# Patient Record
Sex: Male | Born: 1950
Health system: Southern US, Community
[De-identification: ages and names within clinical notes are randomized; demographics above are authoritative.]

## PROBLEM LIST (undated history)

## (undated) DIAGNOSIS — M1990 Unspecified osteoarthritis, unspecified site: Secondary | ICD-10-CM

## (undated) DIAGNOSIS — I10 Essential (primary) hypertension: Secondary | ICD-10-CM

## (undated) DIAGNOSIS — R011 Cardiac murmur, unspecified: Secondary | ICD-10-CM

## (undated) DIAGNOSIS — E785 Hyperlipidemia, unspecified: Secondary | ICD-10-CM

## (undated) DIAGNOSIS — J8410 Pulmonary fibrosis, unspecified: Secondary | ICD-10-CM

## (undated) HISTORY — PX: HIP ARTHROPLASTY: SHX981

## (undated) HISTORY — DX: Essential (primary) hypertension: I10

## (undated) HISTORY — PX: HERNIA REPAIR: SHX51

## (undated) HISTORY — PX: JOINT REPLACEMENT: SHX530

## (undated) HISTORY — PX: CATARACT EXTRACTION, BILATERAL: SHX1313

## (undated) HISTORY — DX: Unspecified osteoarthritis, unspecified site: M19.90

## (undated) HISTORY — DX: Cardiac murmur, unspecified: R01.1

## (undated) HISTORY — DX: Hyperlipidemia, unspecified: E78.5

## (undated) HISTORY — DX: Pulmonary fibrosis, unspecified: J84.10

---

## 2003-04-13 LAB — HM COLONOSCOPY

## 2010-12-18 LAB — BASIC METABOLIC PANEL
BUN: 17 mg/dL (ref 4–21)
Creatinine: 0.8 mg/dL (ref 0.6–1.3)
Glucose: 89 mg/dL
Potassium: 4.6 mmol/L (ref 3.4–5.3)
Sodium: 141 mmol/L (ref 137–147)

## 2010-12-18 LAB — LIPID PANEL
Cholesterol: 171 mg/dL (ref 0–200)
HDL: 65 mg/dL (ref 35–70)
LDL Cholesterol: 99 mg/dL
Triglycerides: 34 mg/dL — AB (ref 40–160)

## 2011-01-07 DIAGNOSIS — E785 Hyperlipidemia, unspecified: Secondary | ICD-10-CM | POA: Insufficient documentation

## 2011-01-07 DIAGNOSIS — I10 Essential (primary) hypertension: Secondary | ICD-10-CM

## 2012-10-18 ENCOUNTER — Other Ambulatory Visit: Payer: Self-pay | Admitting: Family Medicine

## 2012-10-18 DIAGNOSIS — M79604 Pain in right leg: Secondary | ICD-10-CM

## 2012-10-21 ENCOUNTER — Ambulatory Visit (HOSPITAL_COMMUNITY)
Admission: RE | Admit: 2012-10-21 | Discharge: 2012-10-21 | Disposition: A | Discharge status: Home / Self Care | Payer: BC Managed Care – PPO | Source: Ambulatory Visit | Attending: Family Medicine | Admitting: Family Medicine

## 2012-10-21 DIAGNOSIS — M79609 Pain in unspecified limb: Secondary | ICD-10-CM | POA: Insufficient documentation

## 2012-10-21 DIAGNOSIS — M79604 Pain in right leg: Secondary | ICD-10-CM

## 2013-01-16 ENCOUNTER — Other Ambulatory Visit: Payer: Self-pay | Admitting: Family Medicine

## 2013-01-18 ENCOUNTER — Encounter: Payer: Self-pay | Admitting: Family Medicine

## 2013-01-18 ENCOUNTER — Ambulatory Visit (INDEPENDENT_AMBULATORY_CARE_PROVIDER_SITE_OTHER): Payer: BC Managed Care – PPO | Admitting: Family Medicine

## 2013-01-18 VITALS — BP 145/82 | HR 78 | Temp 97.0°F | Ht 68.5 in | Wt 179.8 lb

## 2013-01-18 DIAGNOSIS — E785 Hyperlipidemia, unspecified: Secondary | ICD-10-CM

## 2013-01-18 DIAGNOSIS — I10 Essential (primary) hypertension: Secondary | ICD-10-CM

## 2013-01-18 DIAGNOSIS — M1710 Unilateral primary osteoarthritis, unspecified knee: Secondary | ICD-10-CM

## 2013-01-18 DIAGNOSIS — M1711 Unilateral primary osteoarthritis, right knee: Secondary | ICD-10-CM | POA: Insufficient documentation

## 2013-01-18 DIAGNOSIS — IMO0002 Reserved for concepts with insufficient information to code with codable children: Secondary | ICD-10-CM

## 2013-01-18 LAB — LIPID PANEL
Cholesterol: 168 mg/dL (ref 0–200)
HDL: 75 mg/dL (ref >39)
LDL Cholesterol: 80 mg/dL (ref 0–99)
Total CHOL/HDL Ratio: 2.2 Ratio
Triglycerides: 67 mg/dL (ref <150)
VLDL: 13 mg/dL (ref 0–40)

## 2013-01-18 LAB — HEPATIC FUNCTION PANEL
ALT: 20 U/L (ref 0–53)
AST: 16 U/L (ref 0–37)
Albumin: 4.1 g/dL (ref 3.5–5.2)
Alkaline Phosphatase: 51 U/L (ref 39–117)
Bilirubin, Direct: 0.1 mg/dL (ref 0.0–0.3)
Indirect Bilirubin: 0.6 mg/dL (ref 0.0–0.9)
Total Bilirubin: 0.7 mg/dL (ref 0.3–1.2)
Total Protein: 6.3 g/dL (ref 6.0–8.3)

## 2013-01-18 MED ORDER — CHLORTHALIDONE 15 MG PO TABS: 15.0000 mg | ORAL_TABLET | Freq: Every day | ORAL | Status: DC

## 2013-01-18 NOTE — Assessment & Plan Note (Addendum)
No headache, chest pain. Taking the ramipril, amlodipine, for his blood pressure. Will add a diuretic to see if this will bring his blood pressure at goal. Discuss this change for the patient he agrees to it.

## 2013-01-18 NOTE — Assessment & Plan Note (Signed)
Would check a lipid panel today. Livalo has worked well for him so far at 4 mg daily. Will request approval again prescriptions of Livalo

## 2013-01-18 NOTE — Progress Notes (Signed)
Subjective:     Patient ID: Vernon Richard, male   DOB: 06/30/1951, 62 y.o.   MRN: [id number]  HPI Patient comes in for followup of his medical problems which includes 1 hypertension no headache chest pain palpitations nor pedal edema. No cough of the side effect of his medication. Ramipril. Hyperlipidemia trying to eat healthy no problem with that. Osteoarthritis of the right knee he did see orthopedics he did have ultrasound. He rather have conservative measures. He is taking Tylenol on a when necessary basis. One every couple days or so. He is not interested in arthroscopy on this as needed sports.    PMH/PSH: reviewed/updated in Epic  SH/FH: reviewed/updated in Epic  Allergies: reviewed/updated in Epic  Medications: reviewed/updated in Epic  Immunizations: reviewed/updated in Epic    Review of Systems  All other systems reviewed and are negative.       Objective:   Physical Exam  APPEARANCE:  Patient in no acute distress.The patient appeared well nourished and normally developed. Acyanotic.  VITAL SIGNS:BP 145/82  Pulse 78  Temp(Src) 97 F (36.1 C) (Oral)  Ht 5' 8.5" (1.74 m)  Wt 179 lb 12.8 oz (81.557 kg)  BMI 26.94 kg/m2   SKIN: warm and  Dry without overt rashes, tattoos and scars  HEAD and Neck: without JVD, Normal No scleral icterus  CHEST & LUNGS: Clear  CVS: Reveals the PMI to be normally located. Regular rhythm, First and Second Heart sounds are normal, and absence of murmurs, rubs or gallops.  ABDOMEN:  Benign,, no organomegaly, no masses, no Abdominal Aortic enlargement. No Guarding , no rebound. No Bruits.  RECTAL: Deferred not indicated to the  GU: Not applicable today .  Extremities: Both Femoral and Pedal pulses are normal.  MUSCULOSKELETAL:  Spine: normal Joints: intact except for right knee mild crepitus he does a full range of motion the minimal discomfort pain when stepping down off the exam table. Ligaments intact no joint effusion  today  NEUROLOGIC: oriented to time,place and person; nonfocal. Strength is normal Sensory is normal Reflexes are normal Cranial Nerves are normal.      Assessment:     Hypertension No headache, chest pain. Taking the ramipril, amlodipine, for his blood pressure. Will add a diuretic to see if this will bring his blood pressure at goal. Discuss this change for the patient he agrees to it.  Osteoarthritis of right knee Patient currently on he occasionally takes a Tylenol for his osteoarthritis of his right knee. He does not want any further intervention. He had seen orthopedics. Orthopedics recommended arthroscopic if it gets worse. At this point with some tolerable symptoms he can hold off on any other interventions.  Hyperlipidemia Would check a lipid panel today. Livalo has worked well for him so far at 4 mg daily. Will request approval again prescriptions of Livalo        Plan:     Orders Placed This Encounter  Procedures  . Lipid panel  . BASIC METABOLIC PANEL WITH GFR  . Hepatic function panel                                  Meds ordered this encounter  Medications  . Pitavastatin Calcium (LIVALO) 4 MG TABS    Sig: Take 1 tablet by mouth daily.  Marland Kitchen aspirin 81 MG tablet    Sig: Take 81 mg by mouth daily.  . chlorthalidone (HYGROTEN) 15 MG  tablet    Sig: Take 1 tablet (15 mg total) by mouth daily.    Dispense:  30 tablet    Refill:  3   return to clinic in 6 weeks to check his blood pressure and a BMP.    Kory Rains P. [place] Charon, M.D.

## 2013-01-18 NOTE — Assessment & Plan Note (Signed)
Patient currently on he occasionally takes a Tylenol for his osteoarthritis of his right knee. He does not want any further intervention. He had seen orthopedics. Orthopedics recommended arthroscopic if it gets worse. At this point with some tolerable symptoms he can hold off on any other interventions.

## 2013-01-18 NOTE — Patient Instructions (Signed)
Hypertension As your heart beats, it forces blood through your arteries. This force is your blood pressure. If the pressure is too high, it is called hypertension (HTN) or high blood pressure. HTN is dangerous because you may have it and not know it. High blood pressure may mean that your heart has to work harder to pump blood. Your arteries may be narrow or stiff. The extra work puts you at risk for heart disease, stroke, and other problems.  Blood pressure consists of two numbers, a higher number over a lower, 110/72, for example. It is stated as "110 over 72." The ideal is below 120 for the top number (systolic) and under 80 for the bottom (diastolic). Write down your blood pressure today. You should pay close attention to your blood pressure if you have certain conditions such as:  Heart failure.  Prior heart attack.  Diabetes  Chronic kidney disease.  Prior stroke.  Multiple risk factors for heart disease. To see if you have HTN, your blood pressure should be measured while you are seated with your arm held at the level of the heart. It should be measured at least twice. A one-time elevated blood pressure reading (especially in the Emergency Department) does not mean that you need treatment. There may be conditions in which the blood pressure is different between your right and left arms. It is important to see your caregiver soon for a recheck. Most people have essential hypertension which means that there is not a specific cause. This type of high blood pressure may be lowered by changing lifestyle factors such as:  Stress.  Smoking.  Lack of exercise.  Excessive weight.  Drug/tobacco/alcohol use.  Eating less salt. Most people do not have symptoms from high blood pressure until it has caused damage to the body. Effective treatment can often prevent, delay or reduce that damage. TREATMENT  When a cause has been identified, treatment for high blood pressure is directed at the  cause. There are a large number of medications to treat HTN. These fall into several categories, and your caregiver will help you select the medicines that are best for you. Medications may have side effects. You should review side effects with your caregiver. If your blood pressure stays high after you have made lifestyle changes or started on medicines,   Your medication(s) may need to be changed.  Other problems may need to be addressed.  Be certain you understand your prescriptions, and know how and when to take your medicine.  Be sure to follow up with your caregiver within the time frame advised (usually within two weeks) to have your blood pressure rechecked and to review your medications.  If you are taking more than one medicine to lower your blood pressure, make sure you know how and at what times they should be taken. Taking two medicines at the same time can result in blood pressure that is too low. SEEK IMMEDIATE MEDICAL CARE IF:  You develop a severe headache, blurred or changing vision, or confusion.  You have unusual weakness or numbness, or a faint feeling.  You have severe chest or abdominal pain, vomiting, or breathing problems. MAKE SURE YOU:   Understand these instructions.  Will watch your condition.  Will get help right away if you are not doing well or get worse. Document Released: 10/06/2005 Document Revised: 12/29/2011 Document Reviewed: 05/26/2008 [employer] Patient Information 2013 [employer]  Hypertriglyceridemia  Diet for High blood levels of Triglycerides Most fats in food are triglycerides.   Triglycerides in your blood are stored as fat in your body. High levels of triglycerides in your blood may put you at a greater risk for heart disease and stroke.  Normal triglyceride levels are less than 150 mg/dL. Borderline high levels are 150-199 mg/dl. High levels are 200 - 499 mg/dL, and very high triglyceride levels are greater than 500 mg/dL. The decision  to treat high triglycerides is generally based on the level. For people with borderline or high triglyceride levels, treatment includes weight loss and exercise. Drugs are recommended for people with very high triglyceride levels. Many people who need treatment for high triglyceride levels have metabolic syndrome. This syndrome is a collection of disorders that often include: insulin resistance, high blood pressure, blood clotting problems, high cholesterol and triglycerides. TESTING PROCEDURE FOR TRIGLYCERIDES  You should not eat 4 hours before getting your triglycerides measured. The normal range of triglycerides is between 10 and 250 milligrams per deciliter (mg/dl). Some people may have extreme levels (1000 or above), but your triglyceride level may be too high if it is above 150 mg/dl, depending on what other risk factors you have for heart disease.  People with high blood triglycerides may also have high blood cholesterol levels. If you have high blood cholesterol as well as high blood triglycerides, your risk for heart disease is probably greater than if you only had high triglycerides. High blood cholesterol is one of the main risk factors for heart disease. CHANGING YOUR DIET  Your weight can affect your blood triglyceride level. If you are more than 20% above your ideal body weight, you may be able to lower your blood triglycerides by losing weight. Eating less and exercising regularly is the best way to combat this. Fat provides more calories than any other food. The best way to lose weight is to eat less fat. Only 30% of your total calories should come from fat. Less than 7% of your diet should come from saturated fat. A diet low in fat and saturated fat is the same as a diet to decrease blood cholesterol. By eating a diet lower in fat, you may lose weight, lower your blood cholesterol, and lower your blood triglyceride level.  Eating a diet low in fat, especially saturated fat, may also help  you lower your blood triglyceride level. Ask your dietitian to help you figure how much fat you can eat based on the number of calories your caregiver has prescribed for you.  Exercise, in addition to helping with weight loss may also help lower triglyceride levels.   Alcohol can increase blood triglycerides. You may need to stop drinking alcoholic beverages.  Too much carbohydrate in your diet may also increase your blood triglycerides. Some complex carbohydrates are necessary in your diet. These may include bread, rice, potatoes, other starchy vegetables and cereals.  Reduce "simple" carbohydrates. These may include pure sugars, candy, honey, and jelly without losing other nutrients. If you have the kind of high blood triglycerides that is affected by the amount of carbohydrates in your diet, you will need to eat less sugar and less high-sugar foods. Your caregiver can help you with this.  Adding 2-4 grams of fish oil (EPA+ DHA) may also help lower triglycerides. Speak with your caregiver before adding any supplements to your regimen. Following the Diet  Maintain your ideal weight. Your caregivers can help you with a diet. Generally, eating less food and getting more exercise will help you lose weight. Joining a weight control group may also help.   Ask your caregivers for a good weight control group in your area.  Eat low-fat foods instead of high-fat foods. This can help you lose weight too.  These foods are lower in fat. Eat MORE of these:   Dried beans, peas, and lentils.  Egg whites.  Low-fat cottage cheese.  Fish.  Lean cuts of meat, such as round, sirloin, rump, and flank (cut extra fat off meat you fix).  Whole grain breads, cereals and pasta.  Skim and nonfat dry milk.  Low-fat yogurt.  Poultry without the skin.  Cheese made with skim or part-skim milk, such as mozzarella, parmesan, farmers', ricotta, or pot cheese. These are higher fat foods. Eat LESS of these:   Whole  milk and foods made from whole milk, such as American, blue, cheddar, monterey jack, and swiss cheese  High-fat meats, such as luncheon meats, sausages, knockwurst, bratwurst, hot dogs, ribs, corned beef, ground pork, and regular ground beef.  Fried foods. Limit saturated fats in your diet. Substituting unsaturated fat for saturated fat may decrease your blood triglyceride level. You will need to read package labels to know which products contain saturated fats.  These foods are high in saturated fat. Eat LESS of these:   Fried pork skins.  Whole milk.  Skin and fat from poultry.  Palm oil.  Butter.  Shortening.  Cream cheese.  Bacon.  Margarines and baked goods made from listed oils.  Vegetable shortenings.  Chitterlings.  Fat from meats.  Coconut oil.  Palm kernel oil.  Lard.  Cream.  Sour cream.  Fatback.  Coffee whiteners and non-dairy creamers made with these oils.  Cheese made from whole milk. Use unsaturated fats (both polyunsaturated and monounsaturated) moderately. Remember, even though unsaturated fats are better than saturated fats; you still want a diet low in total fat.  These foods are high in unsaturated fat:   Canola oil.  Sunflower oil.  Mayonnaise.  Almonds.  Peanuts.  Pine nuts.  Margarines made with these oils.  Safflower oil.  Olive oil.  Avocados.  Cashews.  Peanut butter.  Sunflower seeds.  Soybean oil.  Peanut oil.  Olives.  Pecans.  Walnuts.  Pumpkin seeds. Avoid sugar and other high-sugar foods. This will decrease carbohydrates without decreasing other nutrients. Sugar in your food goes rapidly to your blood. When there is excess sugar in your blood, your liver may use it to make more triglycerides. Sugar also contains calories without other important nutrients.  Eat LESS of these:   Sugar, brown sugar, powdered sugar, jam, jelly, preserves, honey, syrup, molasses, pies, candy, cakes, cookies,  frosting, pastries, colas, soft drinks, punches, fruit drinks, and regular gelatin.  Avoid alcohol. Alcohol, even more than sugar, may increase blood triglycerides. In addition, alcohol is high in calories and low in nutrients. Ask for sparkling water, or a diet soft drink instead of an alcoholic beverage. Suggestions for planning and preparing meals   Bake, broil, grill or roast meats instead of frying.  Remove fat from meats and skin from poultry before cooking.  Add spices, herbs, lemon juice or vinegar to vegetables instead of salt, rich sauces or gravies.  Use a non-stick skillet without fat or use no-stick sprays.  Cool and refrigerate stews and broth. Then remove the hardened fat floating on the surface before serving.  Refrigerate meat drippings and skim off fat to make low-fat gravies.  Serve more fish.  Use less butter, margarine and other high-fat spreads on bread or vegetables.  Use skim or reconstituted   non-fat dry milk for cooking.  Cook with low-fat cheeses.  Substitute low-fat yogurt or cottage cheese for all or part of the sour cream in recipes for sauces, dips or congealed salads.  Use half yogurt/half mayonnaise in salad recipes.  Substitute evaporated skim milk for cream. Evaporated skim milk or reconstituted non-fat dry milk can be whipped and substituted for whipped cream in certain recipes.  Choose fresh fruits for dessert instead of high-fat foods such as pies or cakes. Fruits are naturally low in fat. When Dining Out   Order low-fat appetizers such as fruit or vegetable juice, pasta with vegetables or tomato sauce.  Select clear, rather than cream soups.  Ask that dressings and gravies be served on the side. Then use less of them.  Order foods that are baked, broiled, poached, steamed, stir-fried, or roasted.  Ask for margarine instead of butter, and use only a small amount.  Drink sparkling water, unsweetened tea or coffee, or diet soft drinks  instead of alcohol or other sweet beverages. QUESTIONS AND ANSWERS ABOUT OTHER FATS IN THE BLOOD: SATURATED FAT, TRANS FAT, AND CHOLESTEROL What is trans fat? Trans fat is a type of fat that is formed when vegetable oil is hardened through a process called hydrogenation. This process helps makes foods more solid, gives them shape, and prolongs their shelf life. Trans fats are also called hydrogenated or partially hydrogenated oils.  What do saturated fat, trans fat, and cholesterol in foods have to do with heart disease? Saturated fat, trans fat, and cholesterol in the diet all raise the level of LDL "bad" cholesterol in the blood. The higher the LDL cholesterol, the greater the risk for coronary heart disease (CHD). Saturated fat and trans fat raise LDL similarly.  What foods contain saturated fat, trans fat, and cholesterol? High amounts of saturated fat are found in animal products, such as fatty cuts of meat, chicken skin, and full-fat dairy products like butter, whole milk, cream, and cheese, and in tropical vegetable oils such as palm, palm kernel, and coconut oil. Trans fat is found in some of the same foods as saturated fat, such as vegetable shortening, some margarines (especially hard or stick margarine), crackers, cookies, baked goods, fried foods, salad dressings, and other processed foods made with partially hydrogenated vegetable oils. Small amounts of trans fat also occur naturally in some animal products, such as milk products, beef, and lamb. Foods high in cholesterol include liver, other organ meats, egg yolks, shrimp, and full-fat dairy products. How can I use the new food label to make heart-healthy food choices? Check the Nutrition Facts panel of the food label. Choose foods lower in saturated fat, trans fat, and cholesterol. For saturated fat and cholesterol, you can also use the Percent Daily Value (%DV): 5% DV or less is low, and 20% DV or more is high. (There is no %DV for trans  fat.) Use the Nutrition Facts panel to choose foods low in saturated fat and cholesterol, and if the trans fat is not listed, read the ingredients and limit products that list shortening or hydrogenated or partially hydrogenated vegetable oil, which tend to be high in trans fat. POINTS TO REMEMBER:   Discuss your risk for heart disease with your caregivers, and take steps to reduce risk factors.  Change your diet. Choose foods that are low in saturated fat, trans fat, and cholesterol.  Add exercise to your daily routine if it is not already being done. Participate in physical activity of moderate intensity, like   brisk walking, for at least 30 minutes on most, and preferably all days of the week. No time? Break the 30 minutes into three, 10-minute segments during the day.  Stop smoking. If you do smoke, contact your caregiver to discuss ways in which they can help you quit.  Do not use street drugs.  Maintain a normal weight.  Maintain a healthy blood pressure.  Keep up with your blood work for checking the fats in your blood as directed by your caregiver. Document Released: 07/24/2004 Document Revised: 04/06/2012 Document Reviewed: 02/19/2009 [employer] Patient Information 2013 [employer].  

## 2013-01-19 LAB — BASIC METABOLIC PANEL WITH GFR
BUN: 17 mg/dL (ref 6–23)
CO2: 24 mEq/L (ref 19–32)
Calcium: 9.1 mg/dL (ref 8.4–10.5)
Chloride: 106 mEq/L (ref 96–112)
Creat: 0.91 mg/dL (ref 0.50–1.35)
GFR, Est African American: >89 mL/min
GFR, Est Non African American: >89 mL/min
Glucose, Bld: 86 mg/dL (ref 70–99)
Potassium: 4.4 mEq/L (ref 3.5–5.3)
Sodium: 140 mEq/L (ref 135–145)

## 2013-01-19 NOTE — Progress Notes (Signed)
Quick Note:  Call patient. Labs normal. No change in plan. ______ 

## 2013-01-21 ENCOUNTER — Other Ambulatory Visit: Payer: Self-pay

## 2013-01-21 MED ORDER — CHLORTHALIDONE 25 MG PO TABS: 25.0000 mg | ORAL_TABLET | Freq: Every day | ORAL | Status: DC

## 2013-03-01 ENCOUNTER — Encounter: Payer: Self-pay | Admitting: Family Medicine

## 2013-03-01 ENCOUNTER — Ambulatory Visit (INDEPENDENT_AMBULATORY_CARE_PROVIDER_SITE_OTHER): Payer: BC Managed Care – PPO | Admitting: Family Medicine

## 2013-03-01 VITALS — BP 134/77 | HR 85 | Temp 98.9°F | Wt 180.0 lb

## 2013-03-01 DIAGNOSIS — I10 Essential (primary) hypertension: Secondary | ICD-10-CM

## 2013-03-01 LAB — BASIC METABOLIC PANEL WITH GFR
BUN: 13 mg/dL (ref 6–23)
CO2: 28 mEq/L (ref 19–32)
Calcium: 9.5 mg/dL (ref 8.4–10.5)
Chloride: 101 mEq/L (ref 96–112)
Creat: 0.99 mg/dL (ref 0.50–1.35)
GFR, Est African American: >89 mL/min
GFR, Est Non African American: 81 mL/min
Glucose, Bld: 99 mg/dL (ref 70–99)
Potassium: 4.2 mEq/L (ref 3.5–5.3)
Sodium: 137 mEq/L (ref 135–145)

## 2013-03-01 NOTE — Progress Notes (Signed)
Patient ID: Vernon Richard, male   DOB: 06-11-1951, 62 y.o.   MRN: [id number] SUBJECTIVE: HPI: Patient is here for follow up of hyperlipidemia/hypertension/OA right knee: denies Headache;denies Chest Pain;denies weakness;denies Shortness of Breath and orthopnea;denies Visual changes;denies palpitations;denies cough;denies pedal edema;denies symptoms of TIA or stroke;deniesClaudication symptoms. admits to Compliance with medications; denies Problems with medications. Recently adjusted his BP meds. Here for bp check.    PMH/PSH: reviewed/updated in Epic  SH/FH: reviewed/updated in Epic  Allergies: reviewed/updated in Epic  Medications: reviewed/updated in Epic  Immunizations: reviewed/updated in Epic  ROS: As above in the HPI. All other systems are stable or negative.  OBJECTIVE: APPEARANCE:  Patient in no acute distress.The patient appeared well nourished and normally developed. Acyanotic. Waist: VITAL SIGNS:BP 134/77  Pulse 85  Temp(Src) 98.9 F (37.2 C) (Oral)  Wt 180 lb (81.647 kg)  BMI 26.97 kg/m2   SKIN: warm and  Dry without overt rashes, tattoos and scars  HEAD and Neck: without JVD, Head and scalp: normal Eyes:No scleral icterus. Fundi normal, eye movements normal. Ears: Auricle normal, canal normal, Tympanic membranes normal, insufflation normal. Nose: normal Throat: normal Neck & thyroid: normal  CHEST & LUNGS: Chest wall: normal Lungs: Clear  CVS: Reveals the PMI to be normally located. Regular rhythm, First and Second Heart sounds are normal,  absence of murmurs, rubs or gallops. Peripheral vasculature: Radial pulses: normal Dorsal pedis pulses: normal Posterior pulses: normal  ABDOMEN:  Appearance: normal Benign,, no organomegaly, no masses, no Abdominal Aortic enlargement. No Guarding , no rebound. No Bruits. Bowel sounds: normal  RECTAL: N/A GU: N/A  EXTREMETIES: nonedematous. Both Femoral and Pedal pulses are normal.  MUSCULOSKELETAL:   Spine: normal Joints: intact  NEUROLOGIC: oriented to time,place and person; nonfocal. Strength is normal Sensory is normal Reflexes are normal Cranial Nerves are normal.  LABS: Results for orders placed in visit on 01/18/13  LIPID PANEL      Result Value Range   Cholesterol 168  0 - 200 mg/dL   Triglycerides 67  <409 mg/dL   HDL 75  >81 mg/dL   Total CHOL/HDL Ratio 2.2     VLDL 13  0 - 40 mg/dL   LDL Cholesterol 80  0 - 99 mg/dL  BASIC METABOLIC PANEL WITH GFR      Result Value Range   Sodium 140  135 - 145 mEq/L   Potassium 4.4  3.5 - 5.3 mEq/L   Chloride 106  96 - 112 mEq/L   CO2 24  19 - 32 mEq/L   Glucose, Bld 86  70 - 99 mg/dL   BUN 17  6 - 23 mg/dL   Creat 1.91  4.78 - 2.95 mg/dL   Calcium 9.1  8.4 - 62.1 mg/dL   GFR, Est African American >89     GFR, Est Non African American >89    HEPATIC FUNCTION PANEL      Result Value Range   Total Bilirubin 0.7  0.3 - 1.2 mg/dL   Bilirubin, Direct 0.1  0.0 - 0.3 mg/dL   Indirect Bilirubin 0.6  0.0 - 0.9 mg/dL   Alkaline Phosphatase 51  39 - 117 U/L   AST 16  0 - 37 U/L   ALT 20  0 - 53 U/L   Total Protein 6.3  6.0 - 8.3 g/dL   Albumin 4.1  3.5 - 5.2 g/dL    ASSESSMENT: HTN (hypertension) - Plan: BASIC METABOLIC PANEL WITH GFR  BP better now.acceptable for age.  PLAN: Orders  Placed This Encounter  Procedures  . BASIC METABOLIC PANEL WITH GFR   No orders of the defined types were placed in this encounter.   RTc 4 months.  Emmett Arntz P. [place] Charon, M.D.

## 2013-03-02 NOTE — Progress Notes (Signed)
Quick Note:  Call patient. Labs normal. No change in plan. ______ 

## 2013-04-16 ENCOUNTER — Other Ambulatory Visit: Payer: Self-pay | Admitting: Family Medicine

## 2013-05-19 ENCOUNTER — Other Ambulatory Visit: Payer: Self-pay | Admitting: *Deleted

## 2013-05-19 MED ORDER — CHLORTHALIDONE 25 MG PO TABS: 25.0000 mg | ORAL_TABLET | Freq: Every day | ORAL | Status: DC

## 2013-05-27 ENCOUNTER — Encounter: Payer: Self-pay | Admitting: Family Medicine

## 2013-05-27 ENCOUNTER — Ambulatory Visit (INDEPENDENT_AMBULATORY_CARE_PROVIDER_SITE_OTHER): Payer: BC Managed Care – PPO

## 2013-05-27 ENCOUNTER — Ambulatory Visit (INDEPENDENT_AMBULATORY_CARE_PROVIDER_SITE_OTHER): Payer: BC Managed Care – PPO | Admitting: Family Medicine

## 2013-05-27 VITALS — BP 169/89 | HR 90 | Temp 97.8°F | Wt 185.0 lb

## 2013-05-27 DIAGNOSIS — S9002XA Contusion of left ankle, initial encounter: Secondary | ICD-10-CM

## 2013-05-27 DIAGNOSIS — M5416 Radiculopathy, lumbar region: Secondary | ICD-10-CM

## 2013-05-27 DIAGNOSIS — IMO0002 Reserved for concepts with insufficient information to code with codable children: Secondary | ICD-10-CM

## 2013-05-27 DIAGNOSIS — S9000XA Contusion of unspecified ankle, initial encounter: Secondary | ICD-10-CM

## 2013-05-27 MED ORDER — PREDNISONE 20 MG PO TABS: 20.0000 mg | ORAL_TABLET | Freq: Every day | ORAL | Status: DC

## 2013-05-27 NOTE — Progress Notes (Signed)
Patient ID: Vernon Richard, male   DOB: 04/03/1951, 62 y.o.   MRN: [id number] SUBJECTIVE: CC: Chief Complaint  Patient presents with  . 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 It is getting better.  He came today because since then the left thigh has been on fire and his left lower back has been sore. No weakness in the legs., no fever.  Past Medical History  Diagnosis Date  . Hyperlipidemia   . Hypertension    No past surgical history on file. History   Social History  . Marital Status: Married    Spouse Name: N/A    Number of Children: N/A  . Years of Education: N/A   Occupational History  . Not on file.   Social History Main Topics  . Smoking status: Never Smoker   . Smokeless tobacco: Not on file  . Alcohol Use: No  . Drug Use: No  . Sexually Active: Not on file   Other Topics Concern  . Not on file   Social History Narrative   Married    1 child   No family history on file. Current Outpatient Prescriptions on File Prior to Visit  Medication Sig Dispense Refill  . amLODipine (NORVASC) 10 MG tablet TAKE ONE TABLET BY MOUTH ONE TIME DAILY  30 tablet  4  . aspirin 81 MG tablet Take 81 mg by mouth daily.      . chlorthalidone (HYGROTON) 25 MG tablet Take 1 tablet (25 mg total) by mouth daily.  30 tablet  1  . Pitavastatin Calcium (LIVALO) 4 MG TABS Take 1 tablet by mouth daily.      . ramipril (ALTACE) 5 MG tablet Take 5 mg by mouth daily.         No current facility-administered medications on file prior to visit.   No Known Allergies  There is no immunization history on file for this patient. Prior to Admission medications   Medication Sig Start Date End Date Taking? Authorizing Provider  amLODipine (NORVASC) 10 MG tablet TAKE ONE TABLET BY MOUTH ONE TIME DAILY 04/16/13  Yes  Ernestina Penna, MD  aspirin 81 MG tablet Take 81 mg by mouth daily.   Yes Historical Provider, MD  chlorthalidone (HYGROTON) 25 MG tablet Take 1 tablet (25 mg total) by mouth daily. 05/19/13  Yes Ileana Ladd, MD  Pitavastatin Calcium (LIVALO) 4 MG TABS Take 1 tablet by mouth daily.   Yes Historical Provider, MD  ramipril (ALTACE) 5 MG tablet Take 5 mg by mouth daily.     Yes Historical Provider, MD  predniSONE (DELTASONE) 20 MG tablet Take 1 tablet (20 mg total) by mouth daily. 2 tablets daily for 3 days, then 1 tablet daily for 3 days, then 1/2 tablet daily for 4 days 05/27/13   Ileana Ladd, MD     ROS: As above in the HPI. All other systems are stable or negative.  OBJECTIVE: APPEARANCE:  Patient in no acute distress.The patient appeared well nourished and normally developed. Acyanotic. Waist: VITAL SIGNS:  SKIN: warm and  Dry without overt rashes, tattoos and scars  HEAD and Neck: without JVD, Head and scalp: normal Eyes:No scleral icterus. Fundi normal, eye movements normal. Ears: Auricle normal, canal normal,  Tympanic membranes normal, insufflation normal. Nose: normal Throat: normal Neck & thyroid: normal  CHEST & LUNGS: Chest wall: normal Lungs: Clear  CVS: Reveals the PMI to be normally located. Regular rhythm, First and Second Heart sounds are normal,  absence of murmurs, rubs or gallops. Peripheral vasculature: Radial pulses: normal Dorsal pedis pulses: normal Posterior pulses: normal  ABDOMEN:  Appearance: normal Benign, no organomegaly, no masses, no Abdominal Aortic enlargement. No Guarding , no rebound. No Bruits. Bowel sounds: normal  RECTAL: N/A GU: N/A  EXTREMETIES: nonedematous. Both Femoral and Pedal pulses are normal.  MUSCULOSKELETAL:  Spine: normal Joints: intact  NEUROLOGIC: oriented to time,place and person; nonfocal. Strength is normal Sensory is normal Reflexes are normal Cranial Nerves are normal.   Results for orders  placed in visit on 03/01/13  BASIC METABOLIC PANEL WITH GFR      Result Value Range   Sodium 137  135 - 145 mEq/L   Potassium 4.2  3.5 - 5.3 mEq/L   Chloride 101  96 - 112 mEq/L   CO2 28  19 - 32 mEq/L   Glucose, Bld 99  70 - 99 mg/dL   BUN 13  6 - 23 mg/dL   Creat 0.45  4.09 - 8.11 mg/dL   Calcium 9.5  8.4 - 91.4 mg/dL   GFR, Est African American >89     GFR, Est Non African American 81      ASSESSMENT: Contusion, ankle, left, initial encounter - Plan: DG Ankle Complete Left  Lumbar radiculopathy - Plan: DG Lumbar Spine 2-3 Views, predniSONE (DELTASONE) 20 MG tablet  PLAN:  [facility] reading (PRIMARY) by  Dr. Modesto Charon    : no Fracture seen in the ankle, the lumbar spine has degenerative changes.  Orders Placed This Encounter  Procedures  . DG Lumbar Spine 2-3 Views    Standing Status: Future     Number of Occurrences: 1     Standing Expiration Date: 07/27/2014    Order Specific Question:  Reason for Exam (SYMPTOM  OR DIAGNOSIS REQUIRED)    Answer:  back pain with radiculopathy    Order Specific Question:  Preferred imaging location?    Answer:  Internal  . DG Ankle Complete Left    Standing Status: Future     Number of Occurrences: 1     Standing Expiration Date: 07/27/2014    Order Specific Question:  Reason for Exam (SYMPTOM  OR DIAGNOSIS REQUIRED)    Answer:  kicked by a cow    Order Specific Question:  Preferred imaging location?    Answer:  Internal   Meds ordered this encounter  Medications  . predniSONE (DELTASONE) 20 MG tablet    Sig: Take 1 tablet (20 mg total) by mouth daily. 2 tablets daily for 3 days, then 1 tablet daily for 3 days, then 1/2 tablet daily for 4 days    Dispense:  11 tablet    Refill:  0   Return if symptoms worsen or fail to improve.  Smriti Barkow P. Modesto Charon, M.D.

## 2013-07-05 ENCOUNTER — Encounter: Payer: Self-pay | Admitting: Family Medicine

## 2013-07-05 ENCOUNTER — Ambulatory Visit (INDEPENDENT_AMBULATORY_CARE_PROVIDER_SITE_OTHER): Payer: 59 | Admitting: Family Medicine

## 2013-07-05 VITALS — BP 149/82 | HR 85 | Temp 97.8°F | Ht 69.0 in | Wt 183.2 lb

## 2013-07-05 DIAGNOSIS — M1710 Unilateral primary osteoarthritis, unspecified knee: Secondary | ICD-10-CM

## 2013-07-05 DIAGNOSIS — M1711 Unilateral primary osteoarthritis, right knee: Secondary | ICD-10-CM

## 2013-07-05 DIAGNOSIS — I10 Essential (primary) hypertension: Secondary | ICD-10-CM

## 2013-07-05 DIAGNOSIS — IMO0002 Reserved for concepts with insufficient information to code with codable children: Secondary | ICD-10-CM

## 2013-07-05 DIAGNOSIS — R011 Cardiac murmur, unspecified: Secondary | ICD-10-CM

## 2013-07-05 DIAGNOSIS — E785 Hyperlipidemia, unspecified: Secondary | ICD-10-CM

## 2013-07-05 NOTE — Progress Notes (Signed)
Patient ID: Vernon Richard, male   DOB: 1950-12-14, 62 y.o.   MRN: [id number] SUBJECTIVE: CC: Chief Complaint  Patient presents with  . Follow-up    4 month     HPI: Patient is here for follow up of hyperlipidemia/htn/osteoarthritis: denies Headache;denies Chest Pain;denies weakness;denies Shortness of Breath and orthopnea;denies Visual changes;denies palpitations;denies cough;denies pedal edema;denies symptoms of TIA or stroke;deniesClaudication symptoms. admits to Compliance with medications; denies Problems with medications.  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  Past Medical History  Diagnosis Date  . Hyperlipidemia   . Hypertension    No past surgical history on file. History   Social History  . Marital Status: Married    Spouse Name: N/A    Number of Children: N/A  . Years of Education: N/A   Occupational History  . Not on file.   Social History Main Topics  . Smoking status: Never Smoker   . Smokeless tobacco: Not on file  . Alcohol Use: No  . Drug Use: No  . Sexual Activity: Not on file   Other Topics Concern  . Not on file   Social History Narrative   Married    1 child   No family history on file. Current Outpatient Prescriptions on File Prior to Visit  Medication Sig Dispense Refill  . amLODipine (NORVASC) 10 MG tablet TAKE ONE TABLET BY MOUTH ONE TIME DAILY  30 tablet  4  . aspirin 81 MG tablet Take 81 mg by mouth daily.      . chlorthalidone (HYGROTON) 25 MG tablet Take 1 tablet (25 mg total) by mouth daily.  30 tablet  1  . Pitavastatin Calcium (LIVALO) 4 MG TABS Take 1 tablet by mouth daily.      . ramipril (ALTACE) 5 MG tablet Take 5 mg by mouth daily.         No current facility-administered medications on file prior to visit.   No Known Allergies  There is no immunization history on file for this patient. Prior to Admission medications   Medication Sig Start Date End Date Taking? Authorizing Provider  amLODipine  (NORVASC) 10 MG tablet TAKE ONE TABLET BY MOUTH ONE TIME DAILY 04/16/13   Ernestina Penna, MD  aspirin 81 MG tablet Take 81 mg by mouth daily.    Historical Provider, MD  chlorthalidone (HYGROTON) 25 MG tablet Take 1 tablet (25 mg total) by mouth daily. 05/19/13   Ileana Ladd, MD  Pitavastatin Calcium (LIVALO) 4 MG TABS Take 1 tablet by mouth daily.    Historical Provider, MD  ramipril (ALTACE) 5 MG tablet Take 5 mg by mouth daily.      Historical Provider, MD     ROS: As above in the HPI. All other systems are stable or negative.  OBJECTIVE: APPEARANCE:  Patient in no acute distress.The patient appeared well nourished and normally developed. Acyanotic. Waist: VITAL SIGNS:BP 149/82  Pulse 85  Temp(Src) 97.8 F (36.6 C) (Oral)  Ht 5\' 9"  (1.753 m)  Wt 183 lb 3.2 oz (83.099 kg)  BMI 27.04 kg/m2 WM BP 125/75  SKIN: warm and  Dry without overt rashes, tattoos and scars  HEAD and Neck: without JVD, Head and scalp: normal Eyes:No scleral icterus. Fundi normal, eye movements normal. Ears: Auricle normal, canal normal, Tympanic membranes normal, insufflation normal. Nose: normal Throat: normal Neck & thyroid: normal  CHEST & LUNGS: Chest wall: normal Lungs: Clear  CVS: Reveals the PMI to be normally located.  Regular rhythm, First and Second Heart sounds are normal,  Soft 2/6 Ejection murmur at the LSB , no other murmurs, rubs or gallops. Peripheral vasculature: Radial pulses: normal Dorsal pedis pulses: normal Posterior pulses: normal  ABDOMEN:  Appearance: normal Benign, no organomegaly, no masses, no Abdominal Aortic enlargement. No Guarding , no rebound. No Bruits. Bowel sounds: normal  RECTAL: N/A GU: N/A  EXTREMETIES: nonedematous.  MUSCULOSKELETAL:  Spine: normal Joints: right knee arthritis stable  NEUROLOGIC: oriented to time,place and person; nonfocal. Strength is normal Sensory is normal Reflexes are normal Cranial Nerves are normal.  Results for  orders placed in visit on 03/01/13  BASIC METABOLIC PANEL WITH GFR      Result Value Range   Sodium 137  135 - 145 mEq/L   Potassium 4.2  3.5 - 5.3 mEq/L   Chloride 101  96 - 112 mEq/L   CO2 28  19 - 32 mEq/L   Glucose, Bld 99  70 - 99 mg/dL   BUN 13  6 - 23 mg/dL   Creat 4.01  0.27 - 2.53 mg/dL   Calcium 9.5  8.4 - 66.4 mg/dL   GFR, Est African American >89     GFR, Est Non African American 81      ASSESSMENT: Osteoarthritis of right knee  Hypertension - Plan: CMP14+EGFR  Hyperlipidemia - Plan: CMP14+EGFR, NMR, lipoprofile  Heart murmur, systolic - Plan: 2D Echocardiogram without contrast  PLAN: Orders Placed This Encounter  Procedures  . CMP14+EGFR  . NMR, lipoprofile  . 2D Echocardiogram without contrast    Standing Status: Future     Number of Occurrences:      Standing Expiration Date: 07/05/2014    Order Specific Question:  Type of Echo    Answer:  Complete    Order Specific Question:  Reason for Exam    Answer:  newly heard ejection murmur at the left sternal border.    Order Specific Question:  Where should this test be performed    Answer:  [facility]    Discussed with patient the need to look at the heart valves in view of the new murmur. He agrees.  Diet and exercise  Continue the same medications. Keep active.  Return in about 4 months (around 11/04/2013) for Recheck medical problems.  Jakelin Taussig P. Modesto Charon, M.D.

## 2013-07-07 LAB — CMP14+EGFR
ALT: 19 IU/L (ref 0–44)
AST: 15 IU/L (ref 0–40)
Albumin/Globulin Ratio: 2.4 (ref 1.1–2.5)
Albumin: 4.6 g/dL (ref 3.6–4.8)
Alkaline Phosphatase: 52 IU/L (ref 39–117)
BUN/Creatinine Ratio: 11 (ref 10–22)
BUN: 10 mg/dL (ref 8–27)
CO2: 25 mmol/L (ref 18–29)
Calcium: 9.4 mg/dL (ref 8.6–10.2)
Chloride: 94 mmol/L — ABNORMAL LOW (ref 97–108)
Creatinine, Ser: 0.9 mg/dL (ref 0.76–1.27)
GFR calc Af Amer: 105 mL/min/{1.73_m2} (ref >59)
GFR calc non Af Amer: 91 mL/min/{1.73_m2} (ref >59)
Globulin, Total: 1.9 g/dL (ref 1.5–4.5)
Glucose: 102 mg/dL — ABNORMAL HIGH (ref 65–99)
Potassium: 4.3 mmol/L (ref 3.5–5.2)
Sodium: 135 mmol/L (ref 134–144)
Total Bilirubin: 0.6 mg/dL (ref 0.0–1.2)
Total Protein: 6.5 g/dL (ref 6.0–8.5)

## 2013-07-07 LAB — NMR, LIPOPROFILE
Cholesterol: 191 mg/dL (ref <200)
HDL Cholesterol by NMR: 70 mg/dL (ref >=40)
HDL Particle Number: 53.8 umol/L (ref >=30.5)
LDL Particle Number: 1382 nmol/L — ABNORMAL HIGH (ref <1000)
LDL Size: 20.8 nm (ref >20.5)
LDLC SERPL CALC-MCNC: 81 mg/dL (ref <100)
LP-IR Score: 47 — ABNORMAL HIGH (ref <=45)
Small LDL Particle Number: 489 nmol/L (ref <=527)
Triglycerides by NMR: 198 mg/dL — ABNORMAL HIGH (ref <150)

## 2013-07-14 ENCOUNTER — Other Ambulatory Visit: Payer: Self-pay | Admitting: Family Medicine

## 2013-07-18 ENCOUNTER — Telehealth: Payer: Self-pay | Admitting: Family Medicine

## 2013-07-19 ENCOUNTER — Ambulatory Visit (INDEPENDENT_AMBULATORY_CARE_PROVIDER_SITE_OTHER): Payer: 59

## 2013-07-19 DIAGNOSIS — Z23 Encounter for immunization: Secondary | ICD-10-CM

## 2013-07-19 NOTE — Telephone Encounter (Signed)
Pt aware of labs  

## 2013-09-16 ENCOUNTER — Other Ambulatory Visit: Payer: Self-pay | Admitting: Family Medicine

## 2013-10-12 ENCOUNTER — Other Ambulatory Visit: Payer: Self-pay | Admitting: Family Medicine

## 2013-11-08 ENCOUNTER — Encounter: Payer: Self-pay | Admitting: Family Medicine

## 2013-11-08 ENCOUNTER — Ambulatory Visit (INDEPENDENT_AMBULATORY_CARE_PROVIDER_SITE_OTHER): Payer: BC Managed Care – PPO | Admitting: Family Medicine

## 2013-11-08 VITALS — BP 154/86 | HR 84 | Temp 97.1°F | Ht 69.0 in | Wt 183.8 lb

## 2013-11-08 DIAGNOSIS — M1990 Unspecified osteoarthritis, unspecified site: Secondary | ICD-10-CM

## 2013-11-08 DIAGNOSIS — I10 Essential (primary) hypertension: Secondary | ICD-10-CM

## 2013-11-08 DIAGNOSIS — E785 Hyperlipidemia, unspecified: Secondary | ICD-10-CM

## 2013-11-08 DIAGNOSIS — R011 Cardiac murmur, unspecified: Secondary | ICD-10-CM | POA: Insufficient documentation

## 2013-11-08 DIAGNOSIS — M1711 Unilateral primary osteoarthritis, right knee: Secondary | ICD-10-CM

## 2013-11-08 DIAGNOSIS — M1710 Unilateral primary osteoarthritis, unspecified knee: Secondary | ICD-10-CM

## 2013-11-08 DIAGNOSIS — IMO0002 Reserved for concepts with insufficient information to code with codable children: Secondary | ICD-10-CM

## 2013-11-08 DIAGNOSIS — M129 Arthropathy, unspecified: Secondary | ICD-10-CM

## 2013-11-08 MED ORDER — DICLOFENAC SODIUM 3 % TD GEL: TRANSDERMAL | Status: DC

## 2013-11-08 NOTE — Progress Notes (Signed)
Patient ID: Vernon Richard, male   DOB: 04/06/1951, 63 y.o.   MRN: [id number] SUBJECTIVE: CC: Chief Complaint  Patient presents with  . Follow-up    4 month follow up chronic problems c/o legs achy . needs refills     HPI: Patient is here for follow up of hyperlipidemia/HTN/OA of knees: denies Headache;denies Chest Pain;denies weakness;denies Shortness of Breath and orthopnea;denies Visual changes;denies palpitations;denies cough;denies pedal edema;denies symptoms of TIA or stroke;deniesClaudication symptoms. admits to Compliance with medications; denies Problems with medications. He has a heart murmur and never had his echocardiogram. He was never called.  Knees are the main problems.left worse than the right  BPs are fine at home at 120s/70s-80s Past Medical History  Diagnosis Date  . Hyperlipidemia   . Hypertension   . Heart murmur, systolic   . Arthritis     OA of both knees   No past surgical history on file. History   Social History  . Marital Status: Married    Spouse Name: N/A    Number of Children: N/A  . Years of Education: N/A   Occupational History  . Not on file.   Social History Main Topics  . Smoking status: Never Smoker   . Smokeless tobacco: Not on file  . Alcohol Use: No  . Drug Use: No  . Sexual Activity: Not on file   Other Topics Concern  . Not on file   Social History Narrative   Married    1 child   No family history on file. Current Outpatient Prescriptions on File Prior to Visit  Medication Sig Dispense Refill  . amLODipine (NORVASC) 10 MG tablet TAKE ONE TABLET BY MOUTH ONE TIME DAILY  30 tablet  3  . aspirin 81 MG tablet Take 81 mg by mouth daily.      . chlorthalidone (HYGROTON) 25 MG tablet TAKE ONE TABLET BY MOUTH ONE TIME DAILY  30 tablet  3  . LIVALO 4 MG TABS TAKE ONE TABLET BY MOUTH AT BEDTIME  30 tablet  2  . ramipril (ALTACE) 10 MG capsule TAKE ONE CAPSULE BY MOUTH TWICE DAILY  60 capsule  2   No current facility-administered  medications on file prior to visit.   No Known Allergies Immunization History  Administered Date(s) Administered  . Influenza,inj,Quad PF,36+ Mos 07/19/2013  . Tdap 07/26/2012   Prior to Admission medications   Medication Sig Start Date End Date Taking? Authorizing Provider  amLODipine (NORVASC) 10 MG tablet TAKE ONE TABLET BY MOUTH ONE TIME DAILY 09/16/13  Yes Chipper Herb, MD  aspirin 81 MG tablet Take 81 mg by mouth daily.   Yes Historical Provider, MD  chlorthalidone (HYGROTON) 25 MG tablet TAKE ONE TABLET BY MOUTH ONE TIME DAILY 07/14/13  Yes Vernie Shanks, MD  LIVALO 4 MG TABS TAKE ONE TABLET BY MOUTH AT BEDTIME 10/12/13  Yes Vernie Shanks, MD  ramipril (ALTACE) 10 MG capsule TAKE ONE CAPSULE BY MOUTH TWICE DAILY 10/12/13  Yes Vernie Shanks, MD     ROS: As above in the HPI. All other systems are stable or negative.  OBJECTIVE: APPEARANCE:  Patient in no acute distress.The patient appeared well nourished and normally developed. Acyanotic. Waist: VITAL SIGNS:BP 154/86  Pulse 84  Temp(Src) 97.1 F (36.2 C) (Oral)  Ht _0  (1.753 m)  Wt 183 lb 12.8 oz (83.371 kg)  BMI 27.13 kg/m2  Recheck BP 132/80LA and 128/80 RA  WM looks well SKIN: warm and  Dry without  overt rashes, tattoos and scars  HEAD and Neck: without JVD, Head and scalp: normal Eyes:No scleral icterus. Fundi normal, eye movements normal. Ears: Auricle normal, canal normal, Tympanic membranes normal, insufflation normal. Nose: normal Throat: normal Neck & thyroid: normal  CHEST & LUNGS: Chest wall: normal Lungs: Clear  CVS: Reveals the PMI to be normally located. Regular rhythm, First and Second Heart sounds are normal,  absence of murmurs, rubs or gallops. Peripheral vasculature: Radial pulses: normal Dorsal pedis pulses: normal Posterior pulses: normal  ABDOMEN:  Appearance: normal Benign, no organomegaly, no masses, no Abdominal Aortic enlargement. No Guarding , no rebound. No  Bruits. Bowel sounds: normal  RECTAL: N/A GU: N/A  EXTREMETIES: nonedematous.  MUSCULOSKELETAL:  Spine: normal Joints: left knee has crepitus. FROM. Mild  Discomfort. Right knee has mild  Discomfort and mild  crepitus  NEUROLOGIC: oriented to time,place and person; nonfocal.  Results for orders placed in visit on 07/05/13  CMP14+EGFR      Result Value Range   Glucose 102 (*) 65 - 99 mg/dL   BUN 10  8 - 27 mg/dL   Creatinine, Ser 0.90  0.76 - 1.27 mg/dL   GFR calc non Af Amer 91  >59 mL/min/1.73   GFR calc Af Amer 105  >59 mL/min/1.73   BUN/Creatinine Ratio 11  10 - 22   Sodium 135  134 - 144 mmol/L   Potassium 4.3  3.5 - 5.2 mmol/L   Chloride 94 (*) 97 - 108 mmol/L   CO2 25  18 - 29 mmol/L   Calcium 9.4  8.6 - 10.2 mg/dL   Total Protein 6.5  6.0 - 8.5 g/dL   Albumin 4.6  3.6 - 4.8 g/dL   Globulin, Total 1.9  1.5 - 4.5 g/dL   Albumin/Globulin Ratio 2.4  1.1 - 2.5   Total Bilirubin 0.6  0.0 - 1.2 mg/dL   Alkaline Phosphatase 52  39 - 117 IU/L   AST 15  0 - 40 IU/L   ALT 19  0 - 44 IU/L  NMR, LIPOPROFILE      Result Value Range   LDL Particle Number [id number] (*) <1000 nmol/L   LDLC SERPL CALC-MCNC 81  <100 mg/dL   HDL Cholesterol by NMR 70  >=40 mg/dL   Triglycerides by NMR 198 (*) <150 mg/dL   Cholesterol 191  <200 mg/dL   HDL Particle Number [id number]  >=30.5 umol/L   Small LDL Particle Number [id number]  <=527 nmol/L   LDL Size 20.8  >20.5 nm   LP-IR Score 47 (*) <=45    ASSESSMENT: Hypertension - Plan: CMP14+EGFR  Hyperlipidemia - Plan: CMP14+EGFR, NMR, lipoprofile  Osteoarthritis of right knee - Plan: Diclofenac Sodium 3 % GEL  Heart murmur, systolic - Plan: 2D Echocardiogram with contrast  Arthritis  PLAN:      Dr Paula Libra Recommendations  For nutrition information, I recommend books:  1).Eat to Live by Dr Excell Seltzer. 2).Prevent and Reverse Heart Disease by Dr Karl Luke. 3) Dr Janene Harvey Book:  Program to Reverse Diabetes  Exercise  recommendations are:  If unable to walk, then the patient can exercise in a chair 3 times a day. By flapping arms like a bird gently and raising legs outwards to the front.  If ambulatory, the patient can go for walks for 30 minutes 3 times a week. Then increase the intensity and duration as tolerated.  Goal is to try to attain exercise frequency to 5 times a week.  If applicable:  Best to perform resistance exercises (machines or weights) 2 days a week and cardio type exercises 3 days per week.  Orders Placed This Encounter  Procedures  . CMP14+EGFR  . NMR, lipoprofile  . 2D Echocardiogram with contrast    Standing Status: Future     Number of Occurrences:      Standing Expiration Date: 11/08/2014    Order Specific Question:  Type of Echo    Answer:  Complete    Order Specific Question:  Where should this test be performed    Answer:  [facility])    Order Specific Question:  Reason for exam-Echo    Answer:  Murmur  785.2   Meds ordered this encounter  Medications  . Diclofenac Sodium 3 % GEL    Sig: Apply top to knees four times a  Day/.    Dispense:  100 g    Refill:  2   Medications Discontinued During This Encounter  Medication Reason  . ramipril (ALTACE) 5 MG tablet Change in therapy  keep active. Monitor BPs at home  Record and bring to next visit.  Return in about 3 months (around 02/06/2014) for Recheck medical problems.  Lavergne Hiltunen P. Jacelyn Grip, M.D.

## 2013-11-08 NOTE — Patient Instructions (Signed)
      Dr Torryn Fiske's Recommendations  For nutrition information, I recommend books:  1).Eat to Live by Dr Joel Fuhrman. 2).Prevent and Reverse Heart Disease by Dr Caldwell Esselstyn. 3) Dr Neal Barnard's Book:  Program to Reverse Diabetes  Exercise recommendations are:  If unable to walk, then the patient can exercise in a chair 3 times a day. By flapping arms like a bird gently and raising legs outwards to the front.  If ambulatory, the patient can go for walks for 30 minutes 3 times a week. Then increase the intensity and duration as tolerated.  Goal is to try to attain exercise frequency to 5 times a week.  If applicable: Best to perform resistance exercises (machines or weights) 2 days a week and cardio type exercises 3 days per week.  

## 2013-11-10 ENCOUNTER — Other Ambulatory Visit: Payer: Self-pay | Admitting: Family Medicine

## 2013-11-10 LAB — NMR, LIPOPROFILE
Cholesterol: 175 mg/dL (ref <200)
HDL Cholesterol by NMR: 67 mg/dL (ref >=40)
HDL Particle Number: 38.9 umol/L (ref >=30.5)
LDL Particle Number: 878 nmol/L (ref <1000)
LDL Size: 20.6 nm (ref >20.5)
LDLC SERPL CALC-MCNC: 81 mg/dL (ref <100)
LP-IR Score: <25 (ref <=45)
Small LDL Particle Number: 321 nmol/L (ref <=527)
Triglycerides by NMR: 134 mg/dL (ref <150)

## 2013-11-10 LAB — CMP14+EGFR
ALT: 19 IU/L (ref 0–44)
AST: 15 IU/L (ref 0–40)
Albumin/Globulin Ratio: 2.3 (ref 1.1–2.5)
Albumin: 4.5 g/dL (ref 3.6–4.8)
Alkaline Phosphatase: 50 IU/L (ref 39–117)
BUN/Creatinine Ratio: 15 (ref 10–22)
BUN: 14 mg/dL (ref 8–27)
CO2: 23 mmol/L (ref 18–29)
Calcium: 9.3 mg/dL (ref 8.6–10.2)
Chloride: 98 mmol/L (ref 97–108)
Creatinine, Ser: 0.96 mg/dL (ref 0.76–1.27)
GFR calc Af Amer: 97 mL/min/{1.73_m2} (ref >59)
GFR calc non Af Amer: 84 mL/min/{1.73_m2} (ref >59)
Globulin, Total: 2 g/dL (ref 1.5–4.5)
Glucose: 97 mg/dL (ref 65–99)
Potassium: 4.3 mmol/L (ref 3.5–5.2)
Sodium: 138 mmol/L (ref 134–144)
Total Bilirubin: 0.4 mg/dL (ref 0.0–1.2)
Total Protein: 6.5 g/dL (ref 6.0–8.5)

## 2013-12-01 ENCOUNTER — Telehealth: Payer: Self-pay

## 2013-12-01 ENCOUNTER — Other Ambulatory Visit: Payer: Self-pay | Admitting: Family Medicine

## 2013-12-01 DIAGNOSIS — R011 Cardiac murmur, unspecified: Secondary | ICD-10-CM

## 2013-12-01 NOTE — Telephone Encounter (Signed)
reordered

## 2013-12-01 NOTE — Telephone Encounter (Signed)
There is an echocardiogram in her workqueue for Mr. [place]dstun and she has called several times to say this order is not in correctly.  Needs to be a plain echocardiogram not with contrast   If you still want this put it back in that way

## 2013-12-22 ENCOUNTER — Telehealth: Payer: Self-pay | Admitting: *Deleted

## 2013-12-22 ENCOUNTER — Other Ambulatory Visit: Payer: Self-pay | Admitting: Family Medicine

## 2013-12-22 MED ORDER — PRAVASTATIN SODIUM 10 MG PO TABS: 10.0000 mg | ORAL_TABLET | Freq: Every day | ORAL | Status: DC

## 2013-12-22 NOTE — Telephone Encounter (Signed)
Call patient : Prescription changed & sent to pharmacy in [facility]. 

## 2013-12-22 NOTE — Telephone Encounter (Signed)
Dr. Jacelyn Grip in the process of trying to get the denial of livalo overturned, I talked with Vernon Richard's wife and she said he is not taking the livalo now because it was making his joints hurt so badly.  She wants to know can you try something else, he really wants to take something but feels the livalo  Might be doing more harm than good. I think she said he had tried lipitor with the same results. Thanks for your help.

## 2013-12-22 NOTE — Telephone Encounter (Signed)
Aware, new script sent in for pravastatin.

## 2014-01-10 ENCOUNTER — Other Ambulatory Visit: Payer: Self-pay | Admitting: Family Medicine

## 2014-02-10 ENCOUNTER — Other Ambulatory Visit: Payer: Self-pay | Admitting: Family Medicine

## 2014-02-14 ENCOUNTER — Encounter: Payer: Self-pay | Admitting: Family Medicine

## 2014-02-14 ENCOUNTER — Ambulatory Visit (INDEPENDENT_AMBULATORY_CARE_PROVIDER_SITE_OTHER): Payer: BC Managed Care – PPO | Admitting: Family Medicine

## 2014-02-14 VITALS — BP 128/70 | HR 75 | Temp 97.1°F | Ht 69.0 in | Wt 183.8 lb

## 2014-02-14 DIAGNOSIS — I10 Essential (primary) hypertension: Secondary | ICD-10-CM

## 2014-02-14 DIAGNOSIS — M1990 Unspecified osteoarthritis, unspecified site: Secondary | ICD-10-CM

## 2014-02-14 DIAGNOSIS — E785 Hyperlipidemia, unspecified: Secondary | ICD-10-CM

## 2014-02-14 DIAGNOSIS — IMO0002 Reserved for concepts with insufficient information to code with codable children: Secondary | ICD-10-CM

## 2014-02-14 DIAGNOSIS — R011 Cardiac murmur, unspecified: Secondary | ICD-10-CM

## 2014-02-14 DIAGNOSIS — M129 Arthropathy, unspecified: Secondary | ICD-10-CM

## 2014-02-14 DIAGNOSIS — M1711 Unilateral primary osteoarthritis, right knee: Secondary | ICD-10-CM

## 2014-02-14 DIAGNOSIS — M1710 Unilateral primary osteoarthritis, unspecified knee: Secondary | ICD-10-CM

## 2014-02-14 MED ORDER — CHLORTHALIDONE 25 MG PO TABS: ORAL_TABLET | ORAL | Status: DC

## 2014-02-14 MED ORDER — RAMIPRIL 10 MG PO CAPS: ORAL_CAPSULE | ORAL | Status: DC

## 2014-02-14 MED ORDER — AMLODIPINE BESYLATE 10 MG PO TABS: ORAL_TABLET | ORAL | Status: DC

## 2014-02-14 MED ORDER — PRAVASTATIN SODIUM 10 MG PO TABS: 10.0000 mg | ORAL_TABLET | Freq: Every day | ORAL | Status: DC

## 2014-02-14 MED ORDER — DICLOFENAC SODIUM 3 % TD GEL: TRANSDERMAL | Status: DC

## 2014-02-14 NOTE — Patient Instructions (Signed)
DASH Diet  The DASH diet stands for "Dietary Approaches to Stop Hypertension." It is a healthy eating plan that has been shown to reduce high blood pressure (hypertension) in as little as 14 days, while also possibly providing other significant health benefits. These other health benefits include reducing the risk of breast cancer after menopause and reducing the risk of type 2 diabetes, heart disease, colon cancer, and stroke. Health benefits also include weight loss and slowing kidney failure in patients with chronic kidney disease.   DIET GUIDELINES  · Limit salt (sodium). Your diet should contain less than 1500 mg of sodium daily.  · Limit refined or processed carbohydrates. Your diet should include mostly whole grains. Desserts and added sugars should be used sparingly.  · Include small amounts of heart-healthy fats. These types of fats include nuts, oils, and tub margarine. Limit saturated and trans fats. These fats have been shown to be harmful in the body.  CHOOSING FOODS   The following food groups are based on a 2000 calorie diet. See your Registered Dietitian for individual calorie needs.  Grains and Grain Products (6 to 8 servings daily)  · Eat More Often: Whole-wheat bread, brown rice, whole-grain or wheat pasta, quinoa, popcorn without added fat or salt (air popped).  · Eat Less Often: White bread, white pasta, white rice, cornbread.  Vegetables (4 to 5 servings daily)  · Eat More Often: Fresh, frozen, and canned vegetables. Vegetables may be raw, steamed, roasted, or grilled with a minimal amount of fat.  · Eat Less Often/Avoid: Creamed or fried vegetables. Vegetables in a cheese sauce.  Fruit (4 to 5 servings daily)  · Eat More Often: All fresh, canned (in natural juice), or frozen fruits. Dried fruits without added sugar. One hundred percent fruit juice (½ cup [237 mL] daily).  · Eat Less Often: Dried fruits with added sugar. Canned fruit in light or heavy syrup.  Lean Meats, Fish, and Poultry (2  servings or less daily. One serving is 3 to 4 oz [85-114 g]).  · Eat More Often: Ninety percent or leaner ground beef, tenderloin, sirloin. Round cuts of beef, chicken breast, turkey breast. All fish. Grill, bake, or broil your meat. Nothing should be fried.  · Eat Less Often/Avoid: Fatty cuts of meat, turkey, or chicken leg, thigh, or wing. Fried cuts of meat or fish.  Dairy (2 to 3 servings)  · Eat More Often: Low-fat or fat-free milk, low-fat plain or light yogurt, reduced-fat or part-skim cheese.  · Eat Less Often/Avoid: Milk (whole, 2%). Whole milk yogurt. Full-fat cheeses.  Nuts, Seeds, and Legumes (4 to 5 servings per week)  · Eat More Often: All without added salt.  · Eat Less Often/Avoid: Salted nuts and seeds, canned beans with added salt.  Fats and Sweets (limited)  · Eat More Often: Vegetable oils, tub margarines without trans fats, sugar-free gelatin. Mayonnaise and salad dressings.  · Eat Less Often/Avoid: Coconut oils, palm oils, butter, stick margarine, cream, half and half, cookies, candy, pie.  FOR MORE INFORMATION  The Dash Diet Eating Plan: www.dashdiet.org  Document Released: 09/25/2011 Document Revised: 12/29/2011 Document Reviewed: 09/25/2011  [employer]® Patient Information ©2014 [employer].

## 2014-02-14 NOTE — Progress Notes (Signed)
Patient ID: Vernon Richard, male   DOB: 01-11-1951, 63 y.o.   MRN: [id number] SUBJECTIVE: CC: Chief Complaint  Patient presents with  . Follow-up    3 month follow up chronic problems. refill altace and chlorthiadone    HPI: Patient is here for follow up of hyperlipidemia/HTN: denies Headache;denies Chest Pain;denies weakness;denies Shortness of Breath and orthopnea;denies Visual changes;denies palpitations;denies cough;denies pedal edema;denies symptoms of TIA or stroke;deniesClaudication symptoms. admits to Compliance with medications; denies Problems with medications.   major issue is his knees otherwise he feels fine. He has stiffness and soreness of his knees, but he keeps moving and he is okay. Doesn't need much for it Past Medical History  Diagnosis Date  . Hyperlipidemia   . Hypertension   . Heart murmur, systolic   . Arthritis     OA of both knees   No past surgical history on file. History   Social History  . Marital Status: Married    Spouse Name: N/A    Number of Children: N/A  . Years of Education: N/A   Occupational History  . Not on file.   Social History Main Topics  . Smoking status: Never Smoker   . Smokeless tobacco: Not on file  . Alcohol Use: No  . Drug Use: No  . Sexual Activity: Not on file   Other Topics Concern  . Not on file   Social History Narrative   Married    1 child   No family history on file. Current Outpatient Prescriptions on File Prior to Visit  Medication Sig Dispense Refill  . aspirin 81 MG tablet Take 81 mg by mouth daily.      . Diclofenac Sodium 3 % GEL Apply top to knees four times a  Day/.  100 g  2   No current facility-administered medications on file prior to visit.   No Known Allergies Immunization History  Administered Date(s) Administered  . Influenza,inj,Quad PF,36+ Mos 07/19/2013  . Tdap 07/26/2012   Prior to Admission medications   Medication Sig Start Date End Date Taking? Authorizing Provider   amLODipine (NORVASC) 10 MG tablet TAKE ONE TABLET BY MOUTH ONE TIME DAILY   Yes Chipper Herb, MD  aspirin 81 MG tablet Take 81 mg by mouth daily.   Yes Historical Provider, MD  chlorthalidone (HYGROTON) 25 MG tablet TAKE ONE TABLET BY MOUTH ONE TIME DAILY   Yes Vernie Shanks, MD  Diclofenac Sodium 3 % GEL Apply top to knees four times a  Day/. 11/08/13  Yes Vernie Shanks, MD  pravastatin (PRAVACHOL) 10 MG tablet Take 1 tablet (10 mg total) by mouth daily. 12/22/13  Yes Vernie Shanks, MD  ramipril (ALTACE) 10 MG capsule TAKE ONE CAPSULE BY MOUTH TWICE DAILY   Yes Vernie Shanks, MD     ROS: As above in the HPI. All other systems are stable or negative.  OBJECTIVE: APPEARANCE:  Patient in no acute distress.The patient appeared well nourished and normally developed. Acyanotic. Waist: VITAL SIGNS:BP 128/70  Pulse 75  Temp(Src) 97.1 F (36.2 C) (Oral)  Ht _0  (1.753 m)  Wt 183 lb 12.8 oz (83.371 kg)  BMI 27.13 kg/m2   SKIN: warm and  Dry without overt rashes, tattoos and scars  HEAD and Neck: without JVD, Head and scalp: normal Eyes:No scleral icterus. Fundi normal, eye movements normal. Ears: Auricle normal, canal normal, Tympanic membranes normal, insufflation normal. Nose: normal Throat: normal Neck & thyroid: normal  CHEST & LUNGS:  Chest wall: normal Lungs: Clear  CVS: Reveals the PMI to be normally located. Regular rhythm, First and Second Heart sounds are normal,  absence of murmurs, rubs or gallops. Peripheral vasculature: Radial pulses: normal Dorsal pedis pulses: normal Posterior pulses: normal  ABDOMEN:  Appearance: normal Benign, no organomegaly, no masses, no Abdominal Aortic enlargement. No Guarding , no rebound. No Bruits. Bowel sounds: normal  RECTAL: N/A GU: N/A  EXTREMETIES: nonedematous.  MUSCULOSKELETAL:  Spine: normal Joints: intact  NEUROLOGIC: oriented to time,place and person; nonfocal. Strength is normal Sensory is  normal Reflexes are normal Cranial Nerves are normal.  Results for orders placed in visit on 11/08/13  CMP14+EGFR      Result Value Ref Range   Glucose 97  65 - 99 mg/dL   BUN 14  8 - 27 mg/dL   Creatinine, Ser 0.96  0.76 - 1.27 mg/dL   GFR calc non Af Amer 84  >59 mL/min/1.73   GFR calc Af Amer 97  >59 mL/min/1.73   BUN/Creatinine Ratio 15  10 - 22   Sodium 138  134 - 144 mmol/L   Potassium 4.3  3.5 - 5.2 mmol/L   Chloride 98  97 - 108 mmol/L   CO2 23  18 - 29 mmol/L   Calcium 9.3  8.6 - 10.2 mg/dL   Total Protein 6.5  6.0 - 8.5 g/dL   Albumin 4.5  3.6 - 4.8 g/dL   Globulin, Total 2.0  1.5 - 4.5 g/dL   Albumin/Globulin Ratio 2.3  1.1 - 2.5   Total Bilirubin 0.4  0.0 - 1.2 mg/dL   Alkaline Phosphatase 50  39 - 117 IU/L   AST 15  0 - 40 IU/L   ALT 19  0 - 44 IU/L  NMR, LIPOPROFILE      Result Value Ref Range   LDL Particle Number [id number]  <1000 nmol/L   LDLC SERPL CALC-MCNC 81  <100 mg/dL   HDL Cholesterol by NMR 67  >=40 mg/dL   Triglycerides by NMR 134  <150 mg/dL   Cholesterol 175  <200 mg/dL   HDL Particle Number 38.9  >=30.5 umol/L   Small LDL Particle Number 321  <=527 nmol/L   LDL Size 20.6  >20.5 nm   LP-IR Score < 25  <= 45    ASSESSMENT: Hypertension - Plan: CMP14+EGFR, chlorthalidone (HYGROTON) 25 MG tablet, amLODipine (NORVASC) 10 MG tablet, ramipril (ALTACE) 10 MG capsule  Hyperlipidemia - Plan: Lipid panel, pravastatin (PRAVACHOL) 10 MG tablet  Osteoarthritis of right knee  Heart murmur, systolic  Arthritis  PLAN:  Orders Placed This Encounter  Procedures  . CMP14+EGFR  . Lipid panel   Meds ordered this encounter  Medications  . chlorthalidone (HYGROTON) 25 MG tablet    Sig: TAKE ONE TABLET BY MOUTH ONE TIME DAILY    Dispense:  30 tablet    Refill:  4  . amLODipine (NORVASC) 10 MG tablet    Sig: TAKE ONE TABLET BY MOUTH ONE TIME DAILY    Dispense:  30 tablet    Refill:  4  . pravastatin (PRAVACHOL) 10 MG tablet    Sig: Take 1 tablet (10 mg  total) by mouth daily.    Dispense:  30 tablet    Refill:  4  . ramipril (ALTACE) 10 MG capsule    Sig: TAKE ONE CAPSULE BY MOUTH TWICE DAILY    Dispense:  60 capsule    Refill:  4   Medications Discontinued During This Encounter  Medication Reason  . chlorthalidone (HYGROTON) 25 MG tablet Reorder  . amLODipine (NORVASC) 10 MG tablet Reorder  . pravastatin (PRAVACHOL) 10 MG tablet Reorder  . ramipril (ALTACE) 10 MG capsule Reorder   Return in about 3 months (around 05/16/2014).  Kanoe Wanner P. Jacelyn Grip, M.D.

## 2014-02-15 LAB — CMP14+EGFR
ALT: 20 IU/L (ref 0–44)
AST: 14 IU/L (ref 0–40)
Albumin/Globulin Ratio: 2.5 (ref 1.1–2.5)
Albumin: 4.5 g/dL (ref 3.6–4.8)
Alkaline Phosphatase: 51 IU/L (ref 39–117)
BUN/Creatinine Ratio: 15 (ref 10–22)
BUN: 13 mg/dL (ref 8–27)
CO2: 22 mmol/L (ref 18–29)
Calcium: 9.5 mg/dL (ref 8.6–10.2)
Chloride: 97 mmol/L (ref 97–108)
Creatinine, Ser: 0.89 mg/dL (ref 0.76–1.27)
GFR calc Af Amer: 105 mL/min/{1.73_m2} (ref >59)
GFR calc non Af Amer: 91 mL/min/{1.73_m2} (ref >59)
Globulin, Total: 1.8 g/dL (ref 1.5–4.5)
Glucose: 100 mg/dL — ABNORMAL HIGH (ref 65–99)
Potassium: 4 mmol/L (ref 3.5–5.2)
Sodium: 135 mmol/L (ref 134–144)
Total Bilirubin: 0.4 mg/dL (ref 0.0–1.2)
Total Protein: 6.3 g/dL (ref 6.0–8.5)

## 2014-02-15 LAB — LIPID PANEL
Chol/HDL Ratio: 2.9 ratio units (ref 0.0–5.0)
Cholesterol, Total: 203 mg/dL — ABNORMAL HIGH (ref 100–199)
HDL: 71 mg/dL (ref >39)
LDL Calculated: 117 mg/dL — ABNORMAL HIGH (ref 0–99)
Triglycerides: 75 mg/dL (ref 0–149)
VLDL Cholesterol Cal: 15 mg/dL (ref 5–40)

## 2014-02-20 ENCOUNTER — Telehealth: Payer: Self-pay | Admitting: *Deleted

## 2014-02-20 NOTE — Telephone Encounter (Signed)
Aware of lab results  

## 2014-02-20 NOTE — Telephone Encounter (Signed)
Message copied by Shelbie Ammons on Mon Feb 20, 2014  9:49 AM ------      Message from: Vernie Shanks      Created: Wed Feb 15, 2014  7:41 PM       Labs which are not at goal:      LDLc , bad cholesterol went up.            The rest are at goal.            Recommend the following:       Get back on diet and activities since the weather warmed up.      Reduce fats in the diet. Eat 5 vegetable servings a day and 2 fruits.      Same medications. If still high in 3 months he will need to have the statin increased                          ------

## 2014-03-30 ENCOUNTER — Encounter: Payer: Self-pay | Admitting: *Deleted

## 2014-05-16 ENCOUNTER — Ambulatory Visit (INDEPENDENT_AMBULATORY_CARE_PROVIDER_SITE_OTHER): Payer: BC Managed Care – PPO | Admitting: Family

## 2014-05-16 ENCOUNTER — Encounter: Payer: Self-pay | Admitting: Family

## 2014-05-16 VITALS — BP 125/77 | HR 88 | Temp 96.5°F | Ht 69.0 in | Wt 183.4 lb

## 2014-05-16 DIAGNOSIS — E785 Hyperlipidemia, unspecified: Secondary | ICD-10-CM

## 2014-05-16 DIAGNOSIS — Z1321 Encounter for screening for nutritional disorder: Secondary | ICD-10-CM

## 2014-05-16 DIAGNOSIS — I10 Essential (primary) hypertension: Secondary | ICD-10-CM

## 2014-05-16 NOTE — Patient Instructions (Signed)

## 2014-05-16 NOTE — Progress Notes (Signed)
   Subjective:    Patient ID: Vernon Richard, male    DOB: May 15, 1951, 63 y.o.   MRN: [id number]  Hyperlipidemia This is a chronic problem. The current episode started more than 1 year ago. The problem is uncontrolled. Recent lipid tests were reviewed and are high. He has no history of diabetes or hypothyroidism. Pertinent negatives include no leg pain, myalgias or shortness of breath. Current antihyperlipidemic treatment includes statins. The current treatment provides moderate improvement of lipids. Risk factors for coronary artery disease include hypertension and male sex.  Hypertension This is a chronic problem. The current episode started more than 1 year ago. The problem has been waxing and waning since onset. The problem is uncontrolled. Pertinent negatives include no anxiety, headaches, palpitations, peripheral edema or shortness of breath. Risk factors for coronary artery disease include male gender. Past treatments include ACE inhibitors and calcium channel blockers. The current treatment provides mild improvement. There is no history of kidney disease, CAD/MI, heart failure or a thyroid problem. There is no history of sleep apnea.      Review of Systems  Constitutional: Negative.   HENT: Negative.   Respiratory: Negative.  Negative for shortness of breath.   Cardiovascular: Negative.  Negative for palpitations.  Gastrointestinal: Negative.   Endocrine: Negative.   Genitourinary: Negative.   Musculoskeletal: Negative.  Negative for myalgias.  Neurological: Negative.  Negative for headaches.  Hematological: Negative.   Psychiatric/Behavioral: Negative.   All other systems reviewed and are negative.      Objective:   Physical Exam  Vitals reviewed. Constitutional: He is oriented to person, place, and time. He appears well-developed and well-nourished. No distress.  HENT:  Head: Normocephalic.  Right Ear: External ear normal.  Left Ear: External ear normal.  Mouth/Throat:  Oropharynx is clear and moist.  Eyes: Pupils are equal, round, and reactive to light. Right eye exhibits no discharge. Left eye exhibits no discharge.  Neck: Normal range of motion. Neck supple. No thyromegaly present.  Cardiovascular: Normal rate, regular rhythm, normal heart sounds and intact distal pulses.   No murmur heard. Pulmonary/Chest: Effort normal and breath sounds normal. No respiratory distress. He has no wheezes.  Abdominal: Soft. Bowel sounds are normal. He exhibits no distension. There is no tenderness.  Musculoskeletal: Normal range of motion. He exhibits no edema and no tenderness.  Neurological: He is alert and oriented to person, place, and time. He has normal reflexes. No cranial nerve deficit.  Skin: Skin is warm and dry. No rash noted. No erythema.  Psychiatric: He has a normal mood and affect. His behavior is normal. Judgment and thought content normal.    BP 125/77  Pulse 88  Temp(Src) 96.5 F (35.8 C) (Oral)  Ht $R'5\' 9"'LS$  (1.753 m)  Wt 183 lb 6.4 oz (83.19 kg)  BMI 27.07 kg/m2       Assessment & Plan:  1. Hyperlipidemia - CMP14+EGFR - Lipid panel  2. Essential hypertension - CMP14+EGFR  3. Encounter for vitamin deficiency screening - Vit D  25 hydroxy (rtn osteoporosis monitoring)   Continue all meds Labs pending Health Maintenance reviewed Diet and exercise encouraged RTO 4 months  Evelina Dun, FNP

## 2014-05-17 ENCOUNTER — Telehealth: Payer: Self-pay | Admitting: Family

## 2014-05-17 ENCOUNTER — Other Ambulatory Visit: Payer: Self-pay | Admitting: Family

## 2014-05-17 LAB — CMP14+EGFR
A/G RATIO: 2.7 — AB (ref 1.1–2.5)
ALT: 20 IU/L (ref 0–44)
AST: 16 IU/L (ref 0–40)
Albumin: 4.8 g/dL (ref 3.6–4.8)
Alkaline Phosphatase: 54 IU/L (ref 39–117)
BUN/Creatinine Ratio: 15 (ref 10–22)
BUN: 14 mg/dL (ref 8–27)
CALCIUM: 9.4 mg/dL (ref 8.6–10.2)
CO2: 23 mmol/L (ref 18–29)
Chloride: 97 mmol/L (ref 97–108)
Creatinine, Ser: 0.95 mg/dL (ref 0.76–1.27)
GFR calc Af Amer: 97 mL/min/{1.73_m2} (ref >59)
GFR calc non Af Amer: 84 mL/min/{1.73_m2} (ref >59)
Globulin, Total: 1.8 g/dL (ref 1.5–4.5)
Glucose: 102 mg/dL — ABNORMAL HIGH (ref 65–99)
POTASSIUM: 4.2 mmol/L (ref 3.5–5.2)
SODIUM: 136 mmol/L (ref 134–144)
Total Bilirubin: 0.7 mg/dL (ref 0.0–1.2)
Total Protein: 6.6 g/dL (ref 6.0–8.5)

## 2014-05-17 LAB — LIPID PANEL
Chol/HDL Ratio: 3.2 ratio units (ref 0.0–5.0)
Cholesterol, Total: 231 mg/dL — ABNORMAL HIGH (ref 100–199)
HDL: 72 mg/dL (ref >39)
LDL Calculated: 128 mg/dL — ABNORMAL HIGH (ref 0–99)
TRIGLYCERIDES: 153 mg/dL — AB (ref 0–149)
VLDL Cholesterol Cal: 31 mg/dL (ref 5–40)

## 2014-05-17 LAB — VITAMIN D 25 HYDROXY (VIT D DEFICIENCY, FRACTURES): VIT D 25 HYDROXY: 47.4 ng/mL (ref 30.0–100.0)

## 2014-05-17 MED ORDER — PRAVASTATIN SODIUM 40 MG PO TABS: 40.0000 mg | ORAL_TABLET | Freq: Every day | ORAL | Status: DC

## 2014-05-17 NOTE — Telephone Encounter (Signed)
Message copied by Cline Crock on Wed May 17, 2014  2:34 PM ------      Message from: Lenna Gilford, [place] A      Created: Wed May 17, 2014 12:11 PM       Kidney and liver function stable      Cholesterol elevated-New rx sent to pharmacy       Vit D levels WNL ------

## 2014-05-18 ENCOUNTER — Ambulatory Visit: Payer: BC Managed Care – PPO | Admitting: Family

## 2014-05-23 NOTE — Telephone Encounter (Signed)
Spoke with pt- advised him of lab results.  Instructed him to pick up pravachol 40 mg (increased dosage from previous prescription) - at pharmacy.  Pt agreeable.

## 2014-08-17 ENCOUNTER — Other Ambulatory Visit: Payer: Self-pay | Admitting: *Deleted

## 2014-08-17 MED ORDER — AMLODIPINE BESYLATE 10 MG PO TABS: ORAL_TABLET | ORAL | Status: DC

## 2014-08-17 MED ORDER — CHLORTHALIDONE 25 MG PO TABS: ORAL_TABLET | ORAL | Status: DC

## 2014-08-17 MED ORDER — RAMIPRIL 10 MG PO CAPS: ORAL_CAPSULE | ORAL | Status: DC

## 2014-09-13 ENCOUNTER — Ambulatory Visit (INDEPENDENT_AMBULATORY_CARE_PROVIDER_SITE_OTHER): Payer: BC Managed Care – PPO | Admitting: Family Medicine

## 2014-09-13 ENCOUNTER — Encounter: Payer: Self-pay | Admitting: Family Medicine

## 2014-09-13 ENCOUNTER — Ambulatory Visit (INDEPENDENT_AMBULATORY_CARE_PROVIDER_SITE_OTHER): Payer: BC Managed Care – PPO | Admitting: *Deleted

## 2014-09-13 VITALS — BP 145/72 | HR 82 | Temp 97.4°F | Ht 69.0 in | Wt 186.8 lb

## 2014-09-13 DIAGNOSIS — I10 Essential (primary) hypertension: Secondary | ICD-10-CM

## 2014-09-13 DIAGNOSIS — N400 Enlarged prostate without lower urinary tract symptoms: Secondary | ICD-10-CM

## 2014-09-13 DIAGNOSIS — Z23 Encounter for immunization: Secondary | ICD-10-CM

## 2014-09-13 DIAGNOSIS — E785 Hyperlipidemia, unspecified: Secondary | ICD-10-CM

## 2014-09-13 NOTE — Progress Notes (Signed)
   Subjective:    Patient ID: Vernon Richard, male    DOB: 1951/07/20, 63 y.o.   MRN: [id number]  HPI 63 year old gentleman here to follow-up hypertension hyperlipidemia. He feels well except for some discomfort in his right knee. He is compliant as far as medications that he has gained a few pounds and we talked about watching his diet as it  relates to weight gain.  Review of his recent labs shows him to be at or close to goal but he has not had a PSA done in recent years and he would like to have that done the day  Review of Systems  Constitutional: Negative.   HENT: Negative.   Eyes: Negative.   Respiratory: Negative.  Negative for shortness of breath.   Cardiovascular: Negative.  Negative for chest pain and leg swelling.  Gastrointestinal: Negative.   Genitourinary: Negative.   Musculoskeletal: Positive for arthralgias.       Right knee pain  Skin: Negative.   Neurological: Negative.   Psychiatric/Behavioral: Negative.   All other systems reviewed and are negative.      Objective:   Physical Exam  Constitutional: He is oriented to person, place, and time. He appears well-developed and well-nourished.  HENT:  Head: Normocephalic.  Right Ear: External ear normal.  Left Ear: External ear normal.  Nose: Nose normal.  Mouth/Throat: Oropharynx is clear and moist.  Eyes: Conjunctivae and EOM are normal. Pupils are equal, round, and reactive to light.  Neck: Normal range of motion. Neck supple.  Cardiovascular: Normal rate, regular rhythm, normal heart sounds and intact distal pulses.   Pulmonary/Chest: Effort normal and breath sounds normal.  Abdominal: Soft. Bowel sounds are normal.  Musculoskeletal: Normal range of motion.  Neurological: He is alert and oriented to person, place, and time.  Skin: Skin is warm and dry.  Psychiatric: He has a normal mood and affect. His behavior is normal. Judgment and thought content normal.    BP 145/72 mmHg  Pulse 82  Temp(Src) 97.4 F  (36.3 C) (Oral)  Ht 5\' 9"  (1.753 m)  Wt 186 lb 12.8 oz (84.732 kg)  BMI 27.57 kg/m2      Assessment & Plan:  1. Essential hypertension   2. Hyperlipidemia We'll check lipids yearly.

## 2014-09-14 LAB — PSA, TOTAL AND FREE
PSA FREE PCT: 35 %
PSA, Free: 0.07 ng/mL
PSA: 0.2 ng/mL (ref 0.0–4.0)

## 2014-09-16 ENCOUNTER — Other Ambulatory Visit: Payer: Self-pay | Admitting: Family Medicine

## 2014-10-16 ENCOUNTER — Other Ambulatory Visit: Payer: Self-pay | Admitting: *Deleted

## 2014-10-16 MED ORDER — CHLORTHALIDONE 25 MG PO TABS: ORAL_TABLET | ORAL | Status: DC

## 2014-11-08 ENCOUNTER — Encounter: Payer: Self-pay | Admitting: *Deleted

## 2015-03-13 ENCOUNTER — Encounter: Payer: Self-pay | Admitting: Family Medicine

## 2015-03-13 ENCOUNTER — Ambulatory Visit (INDEPENDENT_AMBULATORY_CARE_PROVIDER_SITE_OTHER): Payer: BLUE CROSS/BLUE SHIELD | Admitting: Family Medicine

## 2015-03-13 VITALS — BP 127/79 | HR 74 | Temp 98.2°F | Ht 69.0 in | Wt 189.0 lb

## 2015-03-13 DIAGNOSIS — E785 Hyperlipidemia, unspecified: Secondary | ICD-10-CM

## 2015-03-13 DIAGNOSIS — M170 Bilateral primary osteoarthritis of knee: Secondary | ICD-10-CM

## 2015-03-13 DIAGNOSIS — M1710 Unilateral primary osteoarthritis, unspecified knee: Secondary | ICD-10-CM | POA: Insufficient documentation

## 2015-03-13 DIAGNOSIS — I10 Essential (primary) hypertension: Secondary | ICD-10-CM | POA: Diagnosis not present

## 2015-03-13 DIAGNOSIS — M179 Osteoarthritis of knee, unspecified: Secondary | ICD-10-CM | POA: Insufficient documentation

## 2015-03-13 NOTE — Progress Notes (Signed)
   Subjective:    Patient ID: Vernon Richard, male    DOB: 1951/07/25, 64 y.o.   MRN: [id number]  HPI 64 year old gentleman here to follow-up hypertension and hyperlipidemia. He denies side effects from any of his medications. His main concern today is arthritis in his knees bilaterally. He has received injections before with mixed results.  Patient Active Problem List   Diagnosis Date Noted  . Heart murmur, systolic   . Arthritis   . Osteoarthritis of right knee 01/18/2013  . Hypertension 01/07/2011  . Hyperlipidemia 01/07/2011   Outpatient Encounter Prescriptions as of 03/13/2015  Medication Sig  . amLODipine (NORVASC) 10 MG tablet TAKE ONE TABLET BY MOUTH ONE TIME DAILY  . aspirin 81 MG tablet Take 81 mg by mouth daily.  . chlorthalidone (HYGROTON) 25 MG tablet TAKE ONE TABLET BY MOUTH ONE TIME DAILY  . pravastatin (PRAVACHOL) 40 MG tablet Take 1 tablet (40 mg total) by mouth daily.  . ramipril (ALTACE) 10 MG capsule TAKE ONE CAPSULE BY MOUTH TWICE DAILY  . Diclofenac Sodium 3 % GEL Apply top to knees four times a  Day/. (Patient not taking: Reported on 03/13/2015)   No facility-administered encounter medications on file as of 03/13/2015.        Review of Systems  Constitutional: Negative.   HENT: Negative.   Respiratory: Negative.   Cardiovascular: Negative.   Musculoskeletal: Positive for arthralgias.  Neurological: Negative.   Psychiatric/Behavioral: Negative.        Objective:   Physical Exam  Constitutional: He appears well-developed and well-nourished.  Cardiovascular: Normal rate, regular rhythm and normal heart sounds.   Pulmonary/Chest: Effort normal and breath sounds normal.  Abdominal: Soft. There is no tenderness.  Musculoskeletal:  Patient requested sterile in shot for his knees. He had been using diclofenac gel without any relief of symptoms. The knees were carefully palpated skin was prepped and each knee was injected successfully with 1 mL of Depo-Medrol 1 mL  of Marcaine.    BP 127/79 mmHg  Pulse 74  Temp(Src) 98.2 F (36.8 C) (Oral)  Ht $R'5\' 9"'RK$  (1.753 m)  Wt 189 lb (85.73 kg)  BMI 27.90 kg/m2        Assessment & Plan:  1. Primary osteoarthritis of both knees These injected as described above. Discontinue diclofenac gel. I have suggested he may try Tylenol arthritis strength  2. Essential hypertension Blood pressure is well controlled on 3 drug regimen will continue same  3. Hyperlipidemia Has been 11 months since lipids were checked. LDL is almost at goal when last checked and we probably should continue with same dosage depending on lab results - Lipid panel  Wardell Honour MD - [phone]

## 2015-03-14 ENCOUNTER — Ambulatory Visit: Payer: BC Managed Care – PPO | Admitting: Family Medicine

## 2015-03-14 LAB — CMP14+EGFR
ALBUMIN: 4.5 g/dL (ref 3.6–4.8)
ALK PHOS: 53 IU/L (ref 39–117)
ALT: 20 IU/L (ref 0–44)
AST: 16 IU/L (ref 0–40)
Albumin/Globulin Ratio: 2.4 (ref 1.1–2.5)
BILIRUBIN TOTAL: 0.4 mg/dL (ref 0.0–1.2)
BUN / CREAT RATIO: 17 (ref 10–22)
BUN: 14 mg/dL (ref 8–27)
CALCIUM: 9.2 mg/dL (ref 8.6–10.2)
CHLORIDE: 97 mmol/L (ref 97–108)
CO2: 23 mmol/L (ref 18–29)
CREATININE: 0.82 mg/dL (ref 0.76–1.27)
GFR calc Af Amer: 108 mL/min/{1.73_m2} (ref >59)
GFR calc non Af Amer: 93 mL/min/{1.73_m2} (ref >59)
GLOBULIN, TOTAL: 1.9 g/dL (ref 1.5–4.5)
Glucose: 103 mg/dL — ABNORMAL HIGH (ref 65–99)
Potassium: 4.4 mmol/L (ref 3.5–5.2)
Sodium: 136 mmol/L (ref 134–144)
TOTAL PROTEIN: 6.4 g/dL (ref 6.0–8.5)

## 2015-03-14 LAB — LIPID PANEL
Chol/HDL Ratio: 2.8 ratio units (ref 0.0–5.0)
Cholesterol, Total: 184 mg/dL (ref 100–199)
HDL: 66 mg/dL (ref >39)
LDL CALC: 93 mg/dL (ref 0–99)
TRIGLYCERIDES: 124 mg/dL (ref 0–149)
VLDL Cholesterol Cal: 25 mg/dL (ref 5–40)

## 2015-03-16 ENCOUNTER — Other Ambulatory Visit: Payer: Self-pay | Admitting: Family Medicine

## 2015-03-20 ENCOUNTER — Encounter: Payer: Self-pay | Admitting: *Deleted

## 2015-04-15 ENCOUNTER — Other Ambulatory Visit: Payer: Self-pay | Admitting: Family Medicine

## 2015-05-15 ENCOUNTER — Other Ambulatory Visit: Payer: Self-pay | Admitting: Family

## 2015-06-05 ENCOUNTER — Encounter: Payer: Self-pay | Admitting: *Deleted

## 2015-09-17 ENCOUNTER — Other Ambulatory Visit: Payer: Self-pay | Admitting: Family Medicine

## 2015-09-18 ENCOUNTER — Encounter: Payer: Self-pay | Admitting: Family Medicine

## 2015-09-18 ENCOUNTER — Ambulatory Visit (INDEPENDENT_AMBULATORY_CARE_PROVIDER_SITE_OTHER): Payer: BLUE CROSS/BLUE SHIELD | Admitting: Family Medicine

## 2015-09-18 VITALS — BP 133/75 | HR 72 | Temp 97.4°F | Ht 69.0 in | Wt 190.0 lb

## 2015-09-18 DIAGNOSIS — Z23 Encounter for immunization: Secondary | ICD-10-CM | POA: Diagnosis not present

## 2015-09-18 DIAGNOSIS — I10 Essential (primary) hypertension: Secondary | ICD-10-CM | POA: Diagnosis not present

## 2015-09-18 DIAGNOSIS — E785 Hyperlipidemia, unspecified: Secondary | ICD-10-CM | POA: Diagnosis not present

## 2015-09-18 NOTE — Progress Notes (Signed)
   Subjective:    Patient ID: Vernon Richard, male    DOB: Jul 05, 1951, 64 y.o.   MRN: [id number]  HPI 64 year old gentleman with hypertension and hyperlipidemia. He also has some aches and pains in his knees. Formerly used diclofenac gel but decided it was not effective. He has no side effects to any of his blood pressure medicines or statin. We last checked lipids in May of this year so I have suggested we wait 6 months before check again. Weight is stable blood pressures are good. We did review his lipid numbers from May. Regarding sugar he is in a prediabetes and we discussed weight control and carb control as a means of treating and prevention of diabetes  Patient Active Problem List   Diagnosis Date Noted  . Osteoarthritis of knee 03/13/2015  . Heart murmur, systolic   . Arthritis   . Osteoarthritis of right knee 01/18/2013  . Hypertension 01/07/2011  . Hyperlipidemia 01/07/2011   Outpatient Encounter Prescriptions as of 09/18/2015  Medication Sig  . amLODipine (NORVASC) 10 MG tablet TAKE ONE TABLET BY MOUTH ONE TIME DAILY  . aspirin 81 MG tablet Take 81 mg by mouth daily.  . chlorthalidone (HYGROTON) 25 MG tablet TAKE ONE TABLET BY MOUTH ONE TIME DAILY  . pravastatin (PRAVACHOL) 40 MG tablet TAKE ONE TABLET BY MOUTH ONE TIME DAILY  . ramipril (ALTACE) 10 MG capsule TAKE ONE CAPSULE BY MOUTH TWICE DAILY  . [DISCONTINUED] Diclofenac Sodium 3 % GEL Apply top to knees four times a  Day/. (Patient not taking: Reported on 03/13/2015)   No facility-administered encounter medications on file as of 09/18/2015.      Review of Systems  Constitutional: Negative.   HENT: Negative.   Eyes: Negative.   Respiratory: Negative.  Negative for shortness of breath.   Cardiovascular: Negative.  Negative for chest pain and leg swelling.  Gastrointestinal: Negative.   Genitourinary: Negative.   Musculoskeletal: Positive for arthralgias.  Skin: Negative.   Neurological: Negative.     Psychiatric/Behavioral: Negative.   All other systems reviewed and are negative.      Objective:   Physical Exam  Constitutional: He appears well-developed and well-nourished.  Cardiovascular: Normal rate and regular rhythm.   Pulmonary/Chest: Effort normal.  Neurological: He is alert.  Psychiatric: He has a normal mood and affect.          Assessment & Plan:  1. Essential hypertension Vernon Richard is well controlled on current regimen. Continue same - CMP14+EGFR - Lipid panel - PSA, total and free  2. Hyperlipidemia Lipids are also well controlled continue pravastatin - CMP14+EGFR - Lipid panel - PSA, total and free  Vernon Honour MD

## 2015-09-19 LAB — CMP14+EGFR
ALBUMIN: 4.5 g/dL (ref 3.6–4.8)
ALK PHOS: 57 IU/L (ref 39–117)
ALT: 18 IU/L (ref 0–44)
AST: 16 IU/L (ref 0–40)
Albumin/Globulin Ratio: 2 (ref 1.1–2.5)
BILIRUBIN TOTAL: 0.6 mg/dL (ref 0.0–1.2)
BUN/Creatinine Ratio: 14 (ref 10–22)
BUN: 11 mg/dL (ref 8–27)
CO2: 22 mmol/L (ref 18–29)
CREATININE: 0.81 mg/dL (ref 0.76–1.27)
Calcium: 9.6 mg/dL (ref 8.6–10.2)
Chloride: 94 mmol/L — ABNORMAL LOW (ref 97–106)
GFR calc non Af Amer: 93 mL/min/{1.73_m2} (ref >59)
GFR, EST AFRICAN AMERICAN: 108 mL/min/{1.73_m2} (ref >59)
GLOBULIN, TOTAL: 2.2 g/dL (ref 1.5–4.5)
Glucose: 101 mg/dL — ABNORMAL HIGH (ref 65–99)
Potassium: 4.3 mmol/L (ref 3.5–5.2)
SODIUM: 133 mmol/L — AB (ref 136–144)
TOTAL PROTEIN: 6.7 g/dL (ref 6.0–8.5)

## 2015-09-19 LAB — PSA, TOTAL AND FREE
PROSTATE SPECIFIC AG, SERUM: 0.2 ng/mL (ref 0.0–4.0)
PSA, Free Pct: 25 %
PSA, Free: 0.05 ng/mL

## 2015-09-19 NOTE — Progress Notes (Signed)
Patient aware.

## 2015-09-20 ENCOUNTER — Ambulatory Visit: Payer: BLUE CROSS/BLUE SHIELD | Admitting: Family Medicine

## 2015-10-16 ENCOUNTER — Other Ambulatory Visit: Payer: Self-pay | Admitting: Family Medicine

## 2015-10-16 NOTE — Telephone Encounter (Signed)
Last seen 09/18/15 Dr Sabra Heck  Last lipid 03/13/15

## 2015-11-20 ENCOUNTER — Other Ambulatory Visit: Payer: Self-pay | Admitting: Family Medicine

## 2015-11-21 NOTE — Telephone Encounter (Signed)
Last seen 09/18/15  Dr Sabra Heck  Last lipid 03/13/15

## 2015-12-08 ENCOUNTER — Telehealth: Payer: Self-pay | Admitting: Family Medicine

## 2015-12-08 NOTE — Telephone Encounter (Signed)
Patient states that he is having lower back pain and thinks it may be a pinched nerve. Patient advised that we do not have xray today and he wants to wait until Monday. Appointment given for Monday at 4:15 with [facility].

## 2015-12-10 ENCOUNTER — Ambulatory Visit (INDEPENDENT_AMBULATORY_CARE_PROVIDER_SITE_OTHER): Payer: BLUE CROSS/BLUE SHIELD | Admitting: Family Medicine

## 2015-12-10 ENCOUNTER — Ambulatory Visit (INDEPENDENT_AMBULATORY_CARE_PROVIDER_SITE_OTHER): Payer: BLUE CROSS/BLUE SHIELD

## 2015-12-10 ENCOUNTER — Encounter: Payer: Self-pay | Admitting: Family Medicine

## 2015-12-10 VITALS — BP 133/78 | HR 85 | Temp 97.4°F | Ht 69.0 in | Wt 191.0 lb

## 2015-12-10 DIAGNOSIS — M545 Low back pain, unspecified: Secondary | ICD-10-CM

## 2015-12-10 DIAGNOSIS — M79605 Pain in left leg: Secondary | ICD-10-CM

## 2015-12-10 DIAGNOSIS — M549 Dorsalgia, unspecified: Secondary | ICD-10-CM | POA: Insufficient documentation

## 2015-12-10 MED ORDER — PREDNISONE 20 MG PO TABS: ORAL_TABLET | ORAL | Status: DC

## 2015-12-10 NOTE — Progress Notes (Signed)
   Subjective:    Patient ID: Vernon Richard, male    DOB: 10-01-1951, 65 y.o.   MRN: [id number]  HPI Patient here today for low back pain that radiates down his left leg. He was told previously that he had a pinched nerve. The statement was apparently based on a plain film and not MR study. Pain is on the top of his left leg does not go down beyond his knee. There is no weakness. He took 2 Aleve yesterday and pain has almost resolved today      Patient Active Problem List   Diagnosis Date Noted  . Osteoarthritis of knee 03/13/2015  . Heart murmur, systolic   . Arthritis   . Osteoarthritis of right knee 01/18/2013  . Hypertension 01/07/2011  . Hyperlipidemia 01/07/2011   Outpatient Encounter Prescriptions as of 12/10/2015  Medication Sig  . amLODipine (NORVASC) 10 MG tablet TAKE ONE TABLET BY MOUTH ONE TIME DAILY  . aspirin 81 MG tablet Take 81 mg by mouth daily.  . chlorthalidone (HYGROTON) 25 MG tablet TAKE ONE TABLET BY MOUTH ONE TIME DAILY  . pravastatin (PRAVACHOL) 40 MG tablet TAKE ONE TABLET BY MOUTH ONE  TIME DAILY  . ramipril (ALTACE) 10 MG capsule TAKE ONE CAPSULE BY MOUTH TWICE DAILY   No facility-administered encounter medications on file as of 12/10/2015.      Review of Systems  Constitutional: Negative.   HENT: Negative.   Eyes: Negative.   Respiratory: Negative.   Cardiovascular: Negative.   Gastrointestinal: Negative.   Endocrine: Negative.   Genitourinary: Negative.   Musculoskeletal: Positive for back pain (lower left - with left leg pain).  Skin: Negative.   Allergic/Immunologic: Negative.   Neurological: Negative.   Hematological: Negative.   Psychiatric/Behavioral: Negative.        Objective:   Physical Exam  Constitutional: He appears well-developed and well-nourished.  Musculoskeletal:  Back exam: He has normal and full range of motion. There is no tenderness to percussion over the lumbar spine. Straight leg raising is negative. Deep tendon  reflexes are 3+ and symmetric at the knees and ankles. He is able to walk on his toes and heels without difficulty. Strength is appropriate in his hip flexors and knees.  Psychiatric: He has a normal mood and affect. His behavior is normal.   BP 133/78 mmHg  Pulse 85  Temp(Src) 97.4 F (36.3 C) (Oral)  Ht 5\' 9"  (1.753 m)  Wt 191 lb (86.637 kg)  BMI 28.19 kg/m2        Assessment & Plan:    1. Left leg pain I think he has flared up whatever problem he had had in the past. Would assume with radiculopathy that this is some irritation of the L2 or III nerve root. I will give him prednisone to take 40 mg for 3 days. Activity as tolerated. - DG Lumbar Spine 2-3 Views; Future  Wardell Honour MD

## 2015-12-11 NOTE — Progress Notes (Signed)
Patient aware.

## 2015-12-19 ENCOUNTER — Other Ambulatory Visit: Payer: Self-pay | Admitting: Family Medicine

## 2015-12-20 NOTE — Telephone Encounter (Signed)
Last seen 12/10/15  Dr Sabra Heck  Last lipid 03/13/15

## 2016-01-20 ENCOUNTER — Other Ambulatory Visit: Payer: Self-pay | Admitting: Family Medicine

## 2016-01-21 NOTE — Telephone Encounter (Signed)
Last seen 12/10/15 Dr Sabra Heck  Last lipid 03/13/15

## 2016-02-18 ENCOUNTER — Other Ambulatory Visit: Payer: Self-pay | Admitting: Family Medicine

## 2016-02-19 NOTE — Telephone Encounter (Signed)
Last seen 12/10/15  Dr Sabra Heck   Last lipid 03/13/15

## 2016-03-18 ENCOUNTER — Ambulatory Visit (INDEPENDENT_AMBULATORY_CARE_PROVIDER_SITE_OTHER): Payer: BLUE CROSS/BLUE SHIELD | Admitting: Family Medicine

## 2016-03-18 ENCOUNTER — Other Ambulatory Visit: Payer: Self-pay | Admitting: Family Medicine

## 2016-03-18 ENCOUNTER — Encounter: Payer: Self-pay | Admitting: Family Medicine

## 2016-03-18 VITALS — BP 121/72 | HR 91 | Temp 98.0°F | Ht 69.0 in | Wt 188.0 lb

## 2016-03-18 DIAGNOSIS — E785 Hyperlipidemia, unspecified: Secondary | ICD-10-CM

## 2016-03-18 DIAGNOSIS — I10 Essential (primary) hypertension: Secondary | ICD-10-CM | POA: Diagnosis not present

## 2016-03-18 LAB — LIPID PANEL
CHOL/HDL RATIO: 3.8 ratio (ref 0.0–5.0)
CHOLESTEROL TOTAL: 208 mg/dL — AB (ref 100–199)
HDL: 55 mg/dL (ref >39)
LDL Calculated: 130 mg/dL — ABNORMAL HIGH (ref 0–99)
TRIGLYCERIDES: 115 mg/dL (ref 0–149)
VLDL Cholesterol Cal: 23 mg/dL (ref 5–40)

## 2016-03-18 LAB — CMP14+EGFR
A/G RATIO: 2.3 — AB (ref 1.2–2.2)
ALK PHOS: 57 IU/L (ref 39–117)
ALT: 18 IU/L (ref 0–44)
AST: 19 IU/L (ref 0–40)
Albumin: 4.2 g/dL (ref 3.6–4.8)
BUN/Creatinine Ratio: 18 (ref 10–24)
BUN: 16 mg/dL (ref 8–27)
Bilirubin Total: 0.5 mg/dL (ref 0.0–1.2)
CO2: 24 mmol/L (ref 18–29)
Calcium: 8.9 mg/dL (ref 8.6–10.2)
Chloride: 94 mmol/L — ABNORMAL LOW (ref 96–106)
Creatinine, Ser: 0.9 mg/dL (ref 0.76–1.27)
GFR calc Af Amer: 103 mL/min/{1.73_m2} (ref >59)
GFR, EST NON AFRICAN AMERICAN: 89 mL/min/{1.73_m2} (ref >59)
GLOBULIN, TOTAL: 1.8 g/dL (ref 1.5–4.5)
Glucose: 117 mg/dL — ABNORMAL HIGH (ref 65–99)
POTASSIUM: 4.2 mmol/L (ref 3.5–5.2)
SODIUM: 133 mmol/L — AB (ref 134–144)
Total Protein: 6 g/dL (ref 6.0–8.5)

## 2016-03-18 NOTE — Progress Notes (Signed)
   Subjective:    Patient ID: Vernon Richard, male    DOB: 07-Aug-1951, 65 y.o.   MRN: [id number]  HPI Pt here for follow up and management of chronic medical problems which includes hypertension and hyperlipidemia. He is taking medications regularly. No complaints today. His last visit he had some back pain and radiculopathy but that cleared with prednisone. He does complain of some knee pain sounds like it may be related to some early arthritis. He is still working now for [employer]      Patient Active Problem List   Diagnosis Date Noted  . Back pain 12/10/2015  . Osteoarthritis of knee 03/13/2015  . Heart murmur, systolic   . Arthritis   . Osteoarthritis of right knee 01/18/2013  . Hypertension 01/07/2011  . Hyperlipidemia 01/07/2011   Outpatient Encounter Prescriptions as of 03/18/2016  Medication Sig  . amLODipine (NORVASC) 10 MG tablet TAKE ONE TABLET BY MOUTH ONE TIME DAILY  . aspirin 81 MG tablet Take 81 mg by mouth daily.  . chlorthalidone (HYGROTON) 25 MG tablet TAKE ONE TABLET BY MOUTH ONE TIME DAILY  . pravastatin (PRAVACHOL) 40 MG tablet TAKE ONE TABLET BY MOUTH ONE TIME DAILY  . ramipril (ALTACE) 10 MG capsule TAKE ONE CAPSULE BY MOUTH TWICE DAILY  . [DISCONTINUED] predniSONE (DELTASONE) 20 MG tablet Take 2 tablets daily for 3 days   No facility-administered encounter medications on file as of 03/18/2016.       Review of Systems  Constitutional: Negative.   HENT: Negative.   Eyes: Negative.   Respiratory: Negative.   Cardiovascular: Negative.   Gastrointestinal: Negative.   Endocrine: Negative.   Genitourinary: Negative.   Musculoskeletal: Negative.   Skin: Negative.   Allergic/Immunologic: Negative.   Neurological: Negative.   Hematological: Negative.   Psychiatric/Behavioral: Negative.        Objective:   Physical Exam  Constitutional: He is oriented to person, place, and time. He appears well-developed and well-nourished.  Cardiovascular: Normal  rate, regular rhythm, normal heart sounds and intact distal pulses.   Pulmonary/Chest: Breath sounds normal.  Abdominal: Soft. He exhibits no mass. There is no tenderness.  Neurological: He is alert and oriented to person, place, and time.  Psychiatric: He has a normal mood and affect. His behavior is normal.   BP 121/72 mmHg  Pulse 91  Temp(Src) 98 F (36.7 C) (Oral)  Ht '5\' 9"'$  (1.753 m)  Wt 188 lb (85.276 kg)  BMI 27.75 kg/m2        Assessment & Plan:  1. Essential hypertension Blood pressure is well controlled on current regimen continue same - CMP14+EGFR  2. Hyperlipidemia Lipids were at goal when checked one year ago need to repeat today with LFTs - Lipid panel   Wardell Honour MD

## 2016-03-18 NOTE — Patient Instructions (Signed)
Medicare Annual Wellness Visit  [name] and the medical providers at [facility] strive to bring you the best medical care.  In doing so we not only want to address your current medical conditions and concerns but also to detect new conditions early and prevent illness, disease and health-related problems.    Medicare offers a yearly Wellness Visit which allows our clinical staff to assess your need for preventative services including immunizations, lifestyle education, counseling to decrease risk of preventable diseases and screening for fall risk and other medical concerns.    This visit is provided free of charge (no copay) for all Medicare recipients. The clinical pharmacists at [facility] have begun to conduct these Wellness Visits which will also include a thorough review of all your medications.    As you primary medical provider recommend that you make an appointment for your Annual Wellness Visit if you have not done so already this year.  You may set up this appointment before you leave today or you may call back ([phone]) and schedule an appointment.  Please make sure when you call that you mention that you are scheduling your Annual Wellness Visit with the clinical pharmacist so that the appointment may be made for the proper length of time.     Continue current medications. Continue good therapeutic lifestyle changes which include good diet and exercise. Fall precautions discussed with patient. If an FOBT was given today- please return it to our front desk. If you are over 50 years old - you may need Prevnar 13 or the adult Pneumonia vaccine.  **Flu shots are available--- please call and schedule a FLU-CLINIC appointment**  After your visit with us today you will receive a survey in the mail or online from [employer] regarding your care with us. Please take a moment to fill this out. Your feedback is very  important to us as you can help us better understand your patient needs as well as improve your experience and satisfaction. WE CARE ABOUT YOU!!!    

## 2016-03-19 ENCOUNTER — Telehealth: Payer: Self-pay | Admitting: Family Medicine

## 2016-03-19 NOTE — Telephone Encounter (Signed)
lmtcb

## 2016-03-27 ENCOUNTER — Other Ambulatory Visit: Payer: Self-pay | Admitting: Family Medicine

## 2016-09-09 ENCOUNTER — Other Ambulatory Visit: Payer: Self-pay | Admitting: Family Medicine

## 2016-09-17 ENCOUNTER — Encounter: Payer: Self-pay | Admitting: Family Medicine

## 2016-09-17 ENCOUNTER — Ambulatory Visit (INDEPENDENT_AMBULATORY_CARE_PROVIDER_SITE_OTHER): Payer: BLUE CROSS/BLUE SHIELD | Admitting: Family Medicine

## 2016-09-17 VITALS — BP 130/73 | HR 75 | Temp 96.7°F | Ht 69.0 in | Wt 190.0 lb

## 2016-09-17 DIAGNOSIS — E782 Mixed hyperlipidemia: Secondary | ICD-10-CM

## 2016-09-17 DIAGNOSIS — I10 Essential (primary) hypertension: Secondary | ICD-10-CM | POA: Diagnosis not present

## 2016-09-17 DIAGNOSIS — Z23 Encounter for immunization: Secondary | ICD-10-CM | POA: Diagnosis not present

## 2016-09-17 DIAGNOSIS — Z0000 Encounter for general adult medical examination without abnormal findings: Secondary | ICD-10-CM

## 2016-09-17 NOTE — Progress Notes (Signed)
   Subjective:    Patient ID: Vernon Richard, male    DOB: 01/23/1951, 65 y.o.   MRN: [id number]  HPI 65 year old gentleman here to follow-up blood pressure and lipids. He also requests that we do annual physical today. He's not having any real symptoms or problems. He does have some aches and pains in his knees as well as some postnasal drainage that triggers cough. A lot of coworkers have the same complaint area he does work in a dusty environment.  Patient Active Problem List   Diagnosis Date Noted  . Back pain 12/10/2015  . Osteoarthritis of knee 03/13/2015  . Heart murmur, systolic   . Arthritis   . Osteoarthritis of right knee 01/18/2013  . Hypertension 01/07/2011  . Hyperlipidemia 01/07/2011   Outpatient Encounter Prescriptions as of 09/17/2016  Medication Sig  . amLODipine (NORVASC) 10 MG tablet TAKE ONE TABLET BY MOUTH ONE TIME DAILY  . aspirin 81 MG tablet Take 81 mg by mouth daily.  . chlorthalidone (HYGROTON) 25 MG tablet TAKE ONE TABLET BY MOUTH ONE TIME DAILY  . pravastatin (PRAVACHOL) 40 MG tablet TAKE ONE TABLET BY MOUTH ONE TIME DAILY  . ramipril (ALTACE) 10 MG capsule TAKE ONE CAPSULE BY MOUTH TWICE DAILY  . [DISCONTINUED] amLODipine (NORVASC) 10 MG tablet TAKE ONE TABLET BY MOUTH ONE TIME DAILY  . [DISCONTINUED] chlorthalidone (HYGROTON) 25 MG tablet TAKE ONE TABLET BY MOUTH ONE TIME DAILY  . [DISCONTINUED] pravastatin (PRAVACHOL) 40 MG tablet TAKE ONE TABLET BY MOUTH ONE TIME DAILY  . [DISCONTINUED] ramipril (ALTACE) 10 MG capsule TAKE ONE CAPSULE BY MOUTH TWICE DAILY   No facility-administered encounter medications on file as of 09/17/2016.       Review of Systems  Respiratory: Positive for cough.   Musculoskeletal: Positive for arthralgias (Involves knees bilaterally).       Objective:   Physical Exam  Constitutional: He is oriented to person, place, and time. He appears well-developed and well-nourished.  HENT:  Mouth/Throat: Oropharynx is clear and moist.    Eyes: Pupils are equal, round, and reactive to light.  Cardiovascular: Normal rate, regular rhythm and normal heart sounds.   Pulmonary/Chest: Effort normal and breath sounds normal.  Abdominal: Soft. There is no tenderness.  Genitourinary:  Genitourinary Comments: Prostate is firm but I do not detect any nodules  Musculoskeletal: Normal range of motion.  Neurological: He is alert and oriented to person, place, and time.  Skin: No pallor.  Psychiatric: He has a normal mood and affect. His behavior is normal.   BP 130/73   Pulse 75   Temp (!) 96.7 F (35.9 C) (Oral)   Ht 5\' 9"  (1.753 m)   Wt 190 lb (86.2 kg)   BMI 28.06 kg/m        Assessment & Plan:  1. Essential hypertension Blood pressure well controlled continue current regimen  2. Mixed hyperlipidemia Lipids are mildly elevated but patient has no other risk factors such as diabetes heart disease or stroke  Wardell Honour MD

## 2016-09-18 LAB — PSA, TOTAL AND FREE
PSA, Free Pct: 45 %
PSA, Free: 0.09 ng/mL
Prostate Specific Ag, Serum: 0.2 ng/mL (ref 0.0–4.0)

## 2016-09-18 LAB — CMP14+EGFR
A/G RATIO: 1.9 (ref 1.2–2.2)
ALK PHOS: 59 IU/L (ref 39–117)
ALT: 19 IU/L (ref 0–44)
AST: 14 IU/L (ref 0–40)
Albumin: 4.4 g/dL (ref 3.6–4.8)
BILIRUBIN TOTAL: 0.3 mg/dL (ref 0.0–1.2)
BUN / CREAT RATIO: 13 (ref 10–24)
BUN: 11 mg/dL (ref 8–27)
CALCIUM: 9.1 mg/dL (ref 8.6–10.2)
CHLORIDE: 95 mmol/L — AB (ref 96–106)
CO2: 21 mmol/L (ref 18–29)
Creatinine, Ser: 0.83 mg/dL (ref 0.76–1.27)
GFR calc Af Amer: 106 mL/min/{1.73_m2} (ref >59)
GFR calc non Af Amer: 92 mL/min/{1.73_m2} (ref >59)
GLOBULIN, TOTAL: 2.3 g/dL (ref 1.5–4.5)
Glucose: 98 mg/dL (ref 65–99)
POTASSIUM: 4.2 mmol/L (ref 3.5–5.2)
Sodium: 134 mmol/L (ref 134–144)
TOTAL PROTEIN: 6.7 g/dL (ref 6.0–8.5)

## 2016-09-18 LAB — LIPID PANEL
CHOL/HDL RATIO: 3.4 ratio (ref 0.0–5.0)
Cholesterol, Total: 216 mg/dL — ABNORMAL HIGH (ref 100–199)
HDL: 63 mg/dL (ref >39)
LDL Calculated: 135 mg/dL — ABNORMAL HIGH (ref 0–99)
TRIGLYCERIDES: 89 mg/dL (ref 0–149)
VLDL Cholesterol Cal: 18 mg/dL (ref 5–40)

## 2016-10-08 ENCOUNTER — Ambulatory Visit (INDEPENDENT_AMBULATORY_CARE_PROVIDER_SITE_OTHER): Payer: BLUE CROSS/BLUE SHIELD | Admitting: Family

## 2016-10-08 ENCOUNTER — Encounter: Payer: Self-pay | Admitting: Family

## 2016-10-08 VITALS — BP 119/64 | HR 87 | Temp 98.9°F | Ht 69.0 in | Wt 188.0 lb

## 2016-10-08 DIAGNOSIS — J208 Acute bronchitis due to other specified organisms: Secondary | ICD-10-CM

## 2016-10-08 MED ORDER — FLUTICASONE PROPIONATE 50 MCG/ACT NA SUSP: 2.0000 | Freq: Every day | NASAL | 6 refills | Status: DC

## 2016-10-08 MED ORDER — PREDNISONE 10 MG (21) PO TBPK: ORAL_TABLET | ORAL | 0 refills | Status: DC

## 2016-10-08 MED ORDER — BENZONATATE 200 MG PO CAPS: 200.0000 mg | ORAL_CAPSULE | Freq: Three times a day (TID) | ORAL | 1 refills | Status: DC | PRN

## 2016-10-08 NOTE — Patient Instructions (Signed)

## 2016-10-08 NOTE — Progress Notes (Signed)
Subjective:    Patient ID: Vernon Richard, male    DOB: 06/16/1951, 65 y.o.   MRN: [id number]  Headache   Associated symptoms include coughing and rhinorrhea. Pertinent negatives include no ear pain, fever or sore throat.  Sinus Problem  Associated symptoms include coughing, headaches and shortness of breath. Pertinent negatives include no chills, ear pain or sore throat.  Cough  This is a new problem. The current episode started in the past 7 days. The problem has been gradually worsening. The problem occurs every few minutes. The cough is non-productive. Associated symptoms include ear congestion, headaches, nasal congestion, postnasal drip, rhinorrhea and shortness of breath. Pertinent negatives include no chills, ear pain, fever, myalgias, sore throat or wheezing. The symptoms are aggravated by lying down. He has tried rest and OTC cough suppressant for the symptoms. The treatment provided mild relief. There is no history of asthma or COPD.      Review of Systems  Constitutional: Negative for chills and fever.  HENT: Positive for postnasal drip and rhinorrhea. Negative for ear pain and sore throat.   Respiratory: Positive for cough and shortness of breath. Negative for wheezing.   Musculoskeletal: Negative for myalgias.  Neurological: Positive for headaches.  All other systems reviewed and are negative.      Objective:   Physical Exam  Constitutional: He is oriented to person, place, and time. He appears well-developed and well-nourished. No distress.  HENT:  Head: Normocephalic.  Right Ear: External ear normal.  Left Ear: External ear normal.  Nose: Mucosal edema and rhinorrhea present.  Mouth/Throat: Oropharynx is clear and moist.  Eyes: Pupils are equal, round, and reactive to light. Right eye exhibits no discharge. Left eye exhibits no discharge.  Neck: Normal range of motion. Neck supple. No thyromegaly present.  Cardiovascular: Normal rate, regular rhythm, normal heart  sounds and intact distal pulses.   No murmur heard. Pulmonary/Chest: Effort normal and breath sounds normal. No respiratory distress. He has no wheezes.  Intermittent dry cough   Abdominal: Soft. Bowel sounds are normal. He exhibits no distension. There is no tenderness.  Musculoskeletal: Normal range of motion. He exhibits no edema or tenderness.  Neurological: He is alert and oriented to person, place, and time. He has normal reflexes. No cranial nerve deficit.  Skin: Skin is warm and dry. No rash noted. No erythema.  Psychiatric: He has a normal mood and affect. His behavior is normal. Judgment and thought content normal.  Vitals reviewed.     BP 119/64   Pulse 87   Temp 98.9 F (37.2 C) (Oral)   Ht 5\' 9"  (1.753 m)   Wt 188 lb (85.3 kg)   BMI 27.76 kg/m      Assessment & Plan:  1. Viral bronchitis - Take meds as prescribed - Use a cool mist humidifier  -Use saline nose sprays frequently -Saline irrigations of the nose can be very helpful if done frequently.  * 4X daily for 1 week*  * Use of a nettie pot can be helpful with this. Follow directions with this* -Force fluids -For any cough or congestion  Use plain Mucinex- regular strength or max strength is fine   * Children- consult with Pharmacist for dosing -For fever or aces or pains- take tylenol or ibuprofen appropriate for age and weight.  * for fevers greater than 101 orally you may alternate ibuprofen and tylenol every  3 hours. -Throat lozenges if help - predniSONE (STERAPRED UNI-PAK 21 TAB) 10 MG (21) TBPK  tablet; Use as directed  Dispense: 21 tablet; Refill: 0 - fluticasone (FLONASE) 50 MCG/ACT nasal spray; Place 2 sprays into both nostrils daily.  Dispense: 16 g; Refill: 6 - benzonatate (TESSALON) 200 MG capsule; Take 1 capsule (200 mg total) by mouth 3 (three) times daily as needed.  Dispense: 30 capsule; Refill: Benedict, FNP

## 2016-10-15 ENCOUNTER — Other Ambulatory Visit: Payer: Self-pay | Admitting: Family Medicine

## 2016-11-20 ENCOUNTER — Encounter: Payer: Self-pay | Admitting: Family Medicine

## 2016-11-20 ENCOUNTER — Ambulatory Visit (INDEPENDENT_AMBULATORY_CARE_PROVIDER_SITE_OTHER): Payer: BLUE CROSS/BLUE SHIELD | Admitting: Family Medicine

## 2016-11-20 ENCOUNTER — Ambulatory Visit (INDEPENDENT_AMBULATORY_CARE_PROVIDER_SITE_OTHER): Payer: BLUE CROSS/BLUE SHIELD

## 2016-11-20 VITALS — BP 116/66 | HR 96 | Temp 98.1°F | Ht 69.0 in | Wt 187.4 lb

## 2016-11-20 DIAGNOSIS — R05 Cough: Secondary | ICD-10-CM

## 2016-11-20 DIAGNOSIS — R059 Cough, unspecified: Secondary | ICD-10-CM

## 2016-11-20 MED ORDER — AZITHROMYCIN 250 MG PO TABS: ORAL_TABLET | ORAL | 0 refills | Status: DC

## 2016-11-20 MED ORDER — HYDROCODONE-HOMATROPINE 5-1.5 MG/5ML PO SYRP: 5.0000 mL | ORAL_SOLUTION | Freq: Three times a day (TID) | ORAL | 0 refills | Status: DC | PRN

## 2016-11-20 NOTE — Progress Notes (Signed)
   Subjective:    Patient ID: Vernon Richard, male    DOB: 1951-01-03, 66 y.o.   MRN: [id number]  HPI patient has had nonproductive cough since December. He has not had fever myalgias sore throat headache. He does report some postnasal drainage but denies reflux or asthma symptoms.  Patient Active Problem List   Diagnosis Date Noted  . Back pain 12/10/2015  . Osteoarthritis of knee 03/13/2015  . Heart murmur, systolic   . Arthritis   . Osteoarthritis of right knee 01/18/2013  . Hypertension 01/07/2011  . Hyperlipidemia 01/07/2011   Outpatient Encounter Prescriptions as of 11/20/2016  Medication Sig  . amLODipine (NORVASC) 10 MG tablet TAKE ONE TABLET BY MOUTH ONE TIME DAILY  . aspirin 81 MG tablet Take 81 mg by mouth daily.  . chlorthalidone (HYGROTON) 25 MG tablet TAKE ONE TABLET BY MOUTH ONE TIME DAILY  . fluticasone (FLONASE) 50 MCG/ACT nasal spray Place 2 sprays into both nostrils daily.  . pravastatin (PRAVACHOL) 40 MG tablet TAKE ONE TABLET BY MOUTH ONE TIME DAILY  . ramipril (ALTACE) 10 MG capsule TAKE ONE CAPSULE BY MOUTH TWICE DAILY  . [DISCONTINUED] benzonatate (TESSALON) 200 MG capsule Take 1 capsule (200 mg total) by mouth 3 (three) times daily as needed.  . [DISCONTINUED] predniSONE (STERAPRED UNI-PAK 21 TAB) 10 MG (21) TBPK tablet Use as directed   No facility-administered encounter medications on file as of 11/20/2016.       Review of Systems  Constitutional: Negative.   HENT: Positive for postnasal drip.   Respiratory: Positive for cough.   Cardiovascular: Negative.        Objective:   Physical Exam  Constitutional: He is oriented to person, place, and time. He appears well-developed and well-nourished.  Cardiovascular: Normal rate and regular rhythm.   Pulmonary/Chest: Effort normal. Rales: at left base.  Neurological: He is alert and oriented to person, place, and time.   BP 116/66   Pulse 96   Temp 98.1 F (36.7 C) (Oral)   Ht 5\' 9"  (1.753 m)   Wt 187 lb  6.4 oz (85 kg)   BMI 27.67 kg/m         Assessment & Plan:  1. Cough Chest x-ray consistent with exam. There is an infiltrate on the left. Will treat this as pneumonia but also need to repeat chest x-ray in 3 weeks to ensure that this has cleared. Patient understands importance of follow-up. Rx Z-Pak and Hycodan  Wardell Honour MD - DG Chest 2 View; Future

## 2016-12-03 ENCOUNTER — Encounter: Payer: Self-pay | Admitting: *Deleted

## 2016-12-10 ENCOUNTER — Encounter: Payer: Self-pay | Admitting: Family Medicine

## 2016-12-10 ENCOUNTER — Ambulatory Visit (INDEPENDENT_AMBULATORY_CARE_PROVIDER_SITE_OTHER): Payer: BLUE CROSS/BLUE SHIELD

## 2016-12-10 ENCOUNTER — Ambulatory Visit (INDEPENDENT_AMBULATORY_CARE_PROVIDER_SITE_OTHER): Payer: BLUE CROSS/BLUE SHIELD | Admitting: Family Medicine

## 2016-12-10 VITALS — BP 123/71 | HR 87 | Temp 97.4°F | Ht 69.0 in | Wt 188.0 lb

## 2016-12-10 DIAGNOSIS — R05 Cough: Secondary | ICD-10-CM | POA: Diagnosis not present

## 2016-12-10 DIAGNOSIS — R059 Cough, unspecified: Secondary | ICD-10-CM

## 2016-12-10 DIAGNOSIS — R938 Abnormal findings on diagnostic imaging of other specified body structures: Secondary | ICD-10-CM

## 2016-12-10 DIAGNOSIS — R9389 Abnormal findings on diagnostic imaging of other specified body structures: Secondary | ICD-10-CM

## 2016-12-10 NOTE — Progress Notes (Signed)
   Subjective:    Patient ID: Vernon Richard, male    DOB: 1951/07/23, 66 y.o.   MRN: [id number]  HPI  Patient here today for 3 week follow up on pneumonia. Finished course of Zithromax. Cough never did become productive and he still has some residual cough. He denies any chest pain or other new symptoms.   Patient Active Problem List   Diagnosis Date Noted  . Cough 11/20/2016  . Back pain 12/10/2015  . Osteoarthritis of knee 03/13/2015  . Heart murmur, systolic   . Arthritis   . Osteoarthritis of right knee 01/18/2013  . Hypertension 01/07/2011  . Hyperlipidemia 01/07/2011   Outpatient Encounter Prescriptions as of 12/10/2016  Medication Sig  . amLODipine (NORVASC) 10 MG tablet TAKE ONE TABLET BY MOUTH ONE TIME DAILY  . aspirin 81 MG tablet Take 81 mg by mouth daily.  . chlorthalidone (HYGROTON) 25 MG tablet TAKE ONE TABLET BY MOUTH ONE TIME DAILY  . fluticasone (FLONASE) 50 MCG/ACT nasal spray Place 2 sprays into both nostrils daily.  . pravastatin (PRAVACHOL) 40 MG tablet TAKE ONE TABLET BY MOUTH ONE TIME DAILY  . ramipril (ALTACE) 10 MG capsule TAKE ONE CAPSULE BY MOUTH TWICE DAILY  . [DISCONTINUED] azithromycin (ZITHROMAX Z-PAK) 250 MG tablet Take as directed take as directed  . [DISCONTINUED] HYDROcodone-homatropine (HYCODAN) 5-1.5 MG/5ML syrup Take 5 mLs by mouth every 8 (eight) hours as needed for cough.   No facility-administered encounter medications on file as of 12/10/2016.      Review of Systems  Constitutional: Negative.   HENT: Negative.   Eyes: Negative.   Respiratory: Positive for cough.   Cardiovascular: Negative.   Gastrointestinal: Negative.   Endocrine: Negative.   Genitourinary: Negative.   Musculoskeletal: Negative.   Skin: Negative.   Allergic/Immunologic: Negative.   Neurological: Negative.   Hematological: Negative.   Psychiatric/Behavioral: Negative.        Objective:   Physical Exam  Constitutional: He is oriented to person, place, and time.  He appears well-developed and well-nourished.  Cardiovascular: Normal rate, regular rhythm and normal heart sounds.   Pulmonary/Chest: Effort normal and breath sounds normal.  Neurological: He is alert and oriented to person, place, and time.  Psychiatric: He has a normal mood and affect. His behavior is normal.   BP 123/71 (BP Location: Left Arm)   Pulse 87   Temp 97.4 F (36.3 C) (Oral)   Ht 5\' 9"  (1.753 m)   Wt 188 lb (85.3 kg)   BMI 27.76 kg/m         Assessment & Plan:  1. Cough Patient was asked to return for repeat chest x-ray to be sure infection had cleared. There is no significant change after 3 weeks. - DG Chest 2 View; Future - CT Chest Wo Contrast; Future  2. Abnormal CXR For chest x-ray which showed no improvement after treatment with antibiotic  Wardell Honour MD - CT Chest Wo Contrast; Future

## 2016-12-11 ENCOUNTER — Other Ambulatory Visit: Payer: Self-pay | Admitting: Family

## 2016-12-11 ENCOUNTER — Ambulatory Visit (HOSPITAL_COMMUNITY)
Admission: RE | Admit: 2016-12-11 | Discharge: 2016-12-11 | Disposition: A | Discharge status: Home / Self Care | Payer: BLUE CROSS/BLUE SHIELD | Source: Ambulatory Visit | Attending: Family Medicine | Admitting: Family Medicine

## 2016-12-11 DIAGNOSIS — R9389 Abnormal findings on diagnostic imaging of other specified body structures: Secondary | ICD-10-CM

## 2016-12-11 DIAGNOSIS — I2510 Atherosclerotic heart disease of native coronary artery without angina pectoris: Secondary | ICD-10-CM | POA: Insufficient documentation

## 2016-12-11 DIAGNOSIS — R938 Abnormal findings on diagnostic imaging of other specified body structures: Secondary | ICD-10-CM | POA: Insufficient documentation

## 2016-12-11 DIAGNOSIS — I700 Atherosclerosis of aorta: Secondary | ICD-10-CM | POA: Insufficient documentation

## 2016-12-11 DIAGNOSIS — R05 Cough: Secondary | ICD-10-CM | POA: Diagnosis present

## 2016-12-11 DIAGNOSIS — R059 Cough, unspecified: Secondary | ICD-10-CM

## 2016-12-11 DIAGNOSIS — J849 Interstitial pulmonary disease, unspecified: Secondary | ICD-10-CM | POA: Diagnosis not present

## 2016-12-11 DIAGNOSIS — R918 Other nonspecific abnormal finding of lung field: Secondary | ICD-10-CM

## 2016-12-11 MED ORDER — LEVOFLOXACIN 500 MG PO TABS: 500.0000 mg | ORAL_TABLET | Freq: Every day | ORAL | 0 refills | Status: DC

## 2016-12-17 ENCOUNTER — Telehealth: Payer: Self-pay | Admitting: Family Medicine

## 2016-12-17 NOTE — Telephone Encounter (Signed)
Aware of CT results and has an appointment with [facility] for tomorrow.

## 2016-12-18 ENCOUNTER — Other Ambulatory Visit (INDEPENDENT_AMBULATORY_CARE_PROVIDER_SITE_OTHER): Payer: BLUE CROSS/BLUE SHIELD

## 2016-12-18 ENCOUNTER — Encounter: Payer: Self-pay | Admitting: Internal Medicine

## 2016-12-18 ENCOUNTER — Ambulatory Visit (INDEPENDENT_AMBULATORY_CARE_PROVIDER_SITE_OTHER): Payer: BLUE CROSS/BLUE SHIELD | Admitting: Internal Medicine

## 2016-12-18 VITALS — BP 142/80 | HR 99 | Ht 68.5 in | Wt 186.0 lb

## 2016-12-18 DIAGNOSIS — R05 Cough: Secondary | ICD-10-CM

## 2016-12-18 DIAGNOSIS — I10 Essential (primary) hypertension: Secondary | ICD-10-CM

## 2016-12-18 DIAGNOSIS — R918 Other nonspecific abnormal finding of lung field: Secondary | ICD-10-CM

## 2016-12-18 DIAGNOSIS — J8489 Other specified interstitial pulmonary diseases: Secondary | ICD-10-CM | POA: Insufficient documentation

## 2016-12-18 DIAGNOSIS — R059 Cough, unspecified: Secondary | ICD-10-CM

## 2016-12-18 LAB — CBC WITH DIFFERENTIAL/PLATELET
Basophils Absolute: 0.1 10*3/uL (ref 0.0–0.1)
Basophils Relative: 0.7 % (ref 0.0–3.0)
EOS PCT: 2.4 % (ref 0.0–5.0)
Eosinophils Absolute: 0.2 10*3/uL (ref 0.0–0.7)
HEMATOCRIT: 42.8 % (ref 39.0–52.0)
Hemoglobin: 14.7 g/dL (ref 13.0–17.0)
LYMPHS ABS: 1.2 10*3/uL (ref 0.7–4.0)
Lymphocytes Relative: 15.3 % (ref 12.0–46.0)
MCHC: 34.4 g/dL (ref 30.0–36.0)
MCV: 82.2 fl (ref 78.0–100.0)
MONOS PCT: 12.2 % — AB (ref 3.0–12.0)
Monocytes Absolute: 0.9 10*3/uL (ref 0.1–1.0)
NEUTROS ABS: 5.3 10*3/uL (ref 1.4–7.7)
NEUTROS PCT: 69.4 % (ref 43.0–77.0)
PLATELETS: 283 10*3/uL (ref 150.0–400.0)
RBC: 5.2 Mil/uL (ref 4.22–5.81)
RDW: 13.9 % (ref 11.5–15.5)
WBC: 7.6 10*3/uL (ref 4.0–10.5)

## 2016-12-18 LAB — BASIC METABOLIC PANEL
BUN: 12 mg/dL (ref 6–23)
CHLORIDE: 96 meq/L (ref 96–112)
CO2: 30 mEq/L (ref 19–32)
CREATININE: 0.94 mg/dL (ref 0.40–1.50)
Calcium: 9.3 mg/dL (ref 8.4–10.5)
GFR: 85.16 mL/min (ref >60.00)
GLUCOSE: 94 mg/dL (ref 70–99)
Potassium: 4.1 mEq/L (ref 3.5–5.1)
Sodium: 131 mEq/L — ABNORMAL LOW (ref 135–145)

## 2016-12-18 LAB — SEDIMENTATION RATE: Sed Rate: 18 mm/hr (ref 0–20)

## 2016-12-18 MED ORDER — IRBESARTAN 150 MG PO TABS: 150.0000 mg | ORAL_TABLET | Freq: Every day | ORAL | 11 refills | Status: DC

## 2016-12-18 NOTE — Patient Instructions (Addendum)
Stop altace and start avapro 150 mg daily   Please remember to go to the lab   department downstairs in the basement  for your tests - we will call you with the results when they are available.   Please schedule a follow up office visit in 6 weeks, call sooner if needed with PFTs

## 2016-12-18 NOTE — Progress Notes (Signed)
Subjective:    Patient ID: Vernon Richard, male    DOB: January 26, 1951,    MRN: [id number]  HPI  53 yowm never smoker very physically active with no limitations with indolent onset sob assoc with sniffles/ cough around nov 2017 referred to pulmonary clinic 12/18/2016 by Dr  Posey Rea     12/18/2016 1st [facility] office visit/ Shawnelle Spoerl   Chief Complaint  Patient presents with  . Pulmonary Consult    Dr. Sabra Heck or Dr. 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 No unusual exp hx or h/o childhood pna/ asthma or knowledge of premature birth.  Sleeping ok without nocturnal  or early am exacerbation  of respiratory  c/o's or need for noct saba. Also denies any obvious fluctuation of symptoms with weather or environmental changes or other aggravating or alleviating factors except as outlined above   Current Medications, Allergies, Complete Past Medical History, Past Surgical History, Family History, and Social History were reviewed in [employer] record.   Review of Systems  Constitutional: Negative for fever and unexpected weight change.  HENT: Negative for congestion, dental problem, ear pain, nosebleeds, postnasal drip, rhinorrhea, sinus pressure, sneezing, sore throat and trouble swallowing.   Eyes: Negative for  redness and itching.  Respiratory: Positive for cough and shortness of breath. Negative for chest tightness and wheezing.   Cardiovascular: Negative for palpitations and leg swelling.  Gastrointestinal: Negative for nausea and vomiting.  Genitourinary: Negative for dysuria.  Musculoskeletal: Negative for joint swelling.  Skin: Negative for rash.  Neurological: Negative for headaches.  Hematological: Does not bruise/bleed easily.  Psychiatric/Behavioral: Negative for dysphoric mood. The patient is not nervous/anxious.        Objective:   Physical Exam   amb stoic wm nad    Wt Readings from Last 3 Encounters:  12/18/16 186 lb (84.4 kg)  12/10/16 188 lb (85.3 kg)  11/20/16 187 lb 6.4 oz (85 kg)    Vital signs reviewed  - Note on arrival 02 sats  98% on RA     HEENT: nl dentition, turbinates bilaterally, and oropharynx. Nl external ear canals without cough reflex   NECK :  without JVD/Nodes/TM/ nl carotid upstrokes bilaterally   LUNGS: no acc muscle use,  Nl contour chest with bilateral inps crackles    CV:  RRR  no s3 or murmur or increase in P2, and no edema   ABD:  soft and nontender with nl inspiratory excursion in the supine position. No bruits or organomegaly appreciated, bowel sounds nl  MS:  Nl gait/ ext warm without deformities, calf tenderness, cyanosis  - no def clubbing No obvious joint restrictions   SKIN: warm and dry without lesions    NEURO:  alert,  approp, nl sensorium with  no motor or cerebellar deficits apparent.     I personally reviewed images and agree with radiology impression as follows:  CTw/o contrast Chest  12/11/16 . Findings on the recent chest radiographs appear to be chronic, and are compatible with interstitial lung disease. The overall appearance is nonspecific at this time, but favored to reflect chronic hypersensitivity pneumonitis. Strictly speaking, early usual interstitial pneumonia (UIP) is not excluded, however, and  repeat high-resolution chest CT is recommended in 12 months to assess for temporal changes in the appearance of the lung parenchyma. In addition, outpatient referral to pulmonology for further evaluation is recommended.    Labs ordered/ reviewed:      Chemistry      Component Value Date/Time   NA 131 (L) 12/18/2016 1607   NA 134 09/17/2016 0935   K 4.1 12/18/2016 1607   CL 96 12/18/2016 1607   CO2 30 12/18/2016 1607   BUN 12 12/18/2016 1607   BUN 11 09/17/2016 0935   CREATININE 0.94 12/18/2016 1607   CREATININE 0.99 03/01/2013 1646   GLU 89 12/18/2010      Component Value Date/Time   CALCIUM 9.3 12/18/2016 1607   ALKPHOS 59 09/17/2016 0935   AST 14 09/17/2016 0935   ALT 19 09/17/2016 0935   BILITOT 0.3 09/17/2016 0935        Lab Results  Component Value Date   WBC 7.6 12/18/2016   HGB 14.7 12/18/2016   HCT 42.8 12/18/2016   MCV 82.2 12/18/2016   PLT 283.0 12/18/2016          Lab Results  Component Value Date   ESRSEDRATE 18 12/18/2016     Labs ordered 12/18/2016   HSP profile/ allergy profile      Assessment & Plan:

## 2016-12-19 LAB — RESPIRATORY ALLERGY PROFILE REGION II ~~LOC~~
Allergen, Mulberry, t76: <0.1 kU/L
Aspergillus fumigatus, m3: <0.1 kU/L
BOX ELDER: 0.5 kU/L — AB
Bermuda Grass: 0.18 kU/L — ABNORMAL HIGH
Cockroach: <0.1 kU/L
Common Ragweed: <0.1 kU/L
D. farinae: <0.1 kU/L
IGE (IMMUNOGLOBULIN E), SERUM: 230 kU/L — AB (ref <115)
Johnson Grass: 0.22 kU/L — ABNORMAL HIGH
Pecan/Hickory Tree IgE: <0.1 kU/L
Rough Pigweed  IgE: <0.1 kU/L
Sheep Sorrel IgE: <0.1 kU/L
Timothy Grass: 0.49 kU/L — ABNORMAL HIGH

## 2016-12-19 NOTE — Assessment & Plan Note (Signed)
Many of his features are not typical of ILD - he has sense of sniffles/ pnds and no cough on inspiration which all point to Upper airway cough syndrome (previously labeled PNDS) , is  so named because it's frequently impossible to sort out how much is  CR/sinusitis with freq throat clearing (which can be related to primary GERD)   vs  causing  secondary (" extra esophageal")  GERD from wide swings in gastric pressure that occur with throat clearing, often  promoting self use of mint and menthol lozenges that reduce the lower esophageal sphincter tone and exacerbate the problem further in a cyclical fashion.   These are the same pts (now being labeled as having "irritable larynx syndrome" by some cough centers) who not infrequently have a history of having failed to tolerate ace inhibitors,  dry powder inhalers or biphosphonates or report having atypical/extraesophageal reflux symptoms that don't respond to standard doses of PPI  and are easily confused as having aecopd or asthma flares by even experienced allergists/ pulmonologists (myself included).   ACEi adverse effects at the  top of the usual list of suspects and the only way to rule it out is a trial off > see a/p

## 2016-12-19 NOTE — Assessment & Plan Note (Signed)
ACE inhibitors are problematic in  pts with airway complaints because  even experienced pulmonologists can't always distinguish ace effects from copd/asthma.  By themselves they don't actually cause a problem, much like oxygen can't by itself start a fire, but they certainly serve as a powerful catalyst or enhancer for any "fire"  or inflammatory process in the upper airway, be it caused by an ET  tube or more commonly reflux (especially in the obese or pts with known GERD or who are on biphoshonates).    In the era of ARB near equivalency until we have a better handle on the reversibility of the airway problem, it just makes sense to avoid ACEI  entirely in the short run and then decide later, having established a level of airway control using a reasonable limited regimen, whether to add back ace but even then being very careful to observe the pt for worsening airway control and number of meds used/ needed to control symptoms.    Will hold altace and replace with avapro which he can either break in half if too strong or double if too weak/ advised   see avs for instructions unique to this ov

## 2016-12-19 NOTE — Assessment & Plan Note (Signed)
CT c/w nsip 01/08/17  - ESR 12/18/2016 =  18 and HSP serology pending   DDx for pulmonary fibrosis  includes idiopathic pulmonary fibrosis, pulmonary fibrosis associated with rheumatologic diseases (which have a relatively benign course in most cases) , adverse effect from  drugs such as chemotherapy or amiodarone exposure, nonspecific interstitial pneumonia which is typically steroid responsive, and chronic hypersensitivity pneumonitis which can be seen with NSIP as can connective tissue dz    In active  smokers Langerhan's Cell  Histiocyctosis (eosinophilic granuomatosis),  DIP,  and Respiratory Bronchiolitis ILD also need to be considered, but don't apply here.   I could not detect any risk factors for NSIP and he is not sick enough at this point to start a trial of steroids so will bring him back for a baseline pft and await labs ordered in meantime   Total time devoted to counseling  > 50 % of initial 60 min office visit:  review case with pt/ discussion of options/alternatives/ personally creating written customized instructions  in presence of pt  then going over those specific  Instructions directly with the pt including how to use all of the meds but in particular covering each new medication in detail and the difference between the maintenance= "automatic" meds and the prns using an action plan format for the latter (If this problem/symptom => do that organization reading Left to right).  Please see AVS from this visit for a full list of these instructions which I personally wrote for this pt and  are unique to this visit.

## 2016-12-24 LAB — HYPERSENSITIVITY PNUEMONITIS PROFILE

## 2016-12-25 NOTE — Progress Notes (Signed)
LMTCB

## 2016-12-26 ENCOUNTER — Telehealth: Payer: Self-pay | Admitting: Internal Medicine

## 2016-12-26 NOTE — Telephone Encounter (Signed)
Spoke with pt and made him aware of his results per MW. Pt understood and had no further questions. Nothing further is needed.

## 2016-12-26 NOTE — Telephone Encounter (Signed)
Notes Recorded by Tanda Rockers, MD on 12/25/2016 at 5:04 AM EST Call patient : Studies are unremarkable except for allergy to grass and birch trees --------------------------------- Attempted to contact pt. No answer and his voicemail is not set up. Will try back.

## 2017-01-13 ENCOUNTER — Other Ambulatory Visit: Payer: Self-pay | Admitting: Family Medicine

## 2017-02-04 ENCOUNTER — Encounter: Payer: Self-pay | Admitting: Internal Medicine

## 2017-02-04 ENCOUNTER — Ambulatory Visit (INDEPENDENT_AMBULATORY_CARE_PROVIDER_SITE_OTHER): Payer: BLUE CROSS/BLUE SHIELD | Admitting: Internal Medicine

## 2017-02-04 VITALS — BP 140/80 | HR 93 | Ht 68.5 in | Wt 192.0 lb

## 2017-02-04 DIAGNOSIS — R05 Cough: Secondary | ICD-10-CM

## 2017-02-04 DIAGNOSIS — R918 Other nonspecific abnormal finding of lung field: Secondary | ICD-10-CM

## 2017-02-04 DIAGNOSIS — J8489 Other specified interstitial pulmonary diseases: Secondary | ICD-10-CM | POA: Diagnosis not present

## 2017-02-04 DIAGNOSIS — R059 Cough, unspecified: Secondary | ICD-10-CM

## 2017-02-04 DIAGNOSIS — I10 Essential (primary) hypertension: Secondary | ICD-10-CM

## 2017-02-04 LAB — PULMONARY FUNCTION TEST
DL/VA % pred: 86 %
DL/VA: 3.93 ml/min/mmHg/L
DLCO COR % PRED: 45 %
DLCO cor: 13.9 ml/min/mmHg
DLCO unc % pred: 46 %
DLCO unc: 14.01 ml/min/mmHg
FEF 25-75 POST: 3.46 L/s
FEF 25-75 Pre: 3.04 L/sec
FEF2575-%Change-Post: 13 %
FEF2575-%Pred-Post: 138 %
FEF2575-%Pred-Pre: 122 %
FEV1-%Change-Post: 2 %
FEV1-%Pred-Post: 70 %
FEV1-%Pred-Pre: 68 %
FEV1-Post: 2.23 L
FEV1-Pre: 2.18 L
FEV1FVC-%Change-Post: -2 %
FEV1FVC-%PRED-PRE: 119 %
FEV6-%Change-Post: 4 %
FEV6-%Pred-Post: 63 %
FEV6-%Pred-Pre: 60 %
FEV6-Post: 2.57 L
FEV6-Pre: 2.46 L
FEV6FVC-%Change-Post: 0 %
FEV6FVC-%Pred-Post: 104 %
FEV6FVC-%Pred-Pre: 105 %
FVC-%CHANGE-POST: 4 %
FVC-%PRED-PRE: 57 %
FVC-%Pred-Post: 60 %
FVC-POST: 2.58 L
FVC-PRE: 2.46 L
POST FEV1/FVC RATIO: 87 %
PRE FEV6/FVC RATIO: 100 %
Post FEV6/FVC ratio: 100 %
Pre FEV1/FVC ratio: 89 %
RV % pred: 34 %
RV: 0.8 L
TLC % pred: 50 %
TLC: 3.41 L

## 2017-02-04 NOTE — Progress Notes (Signed)
PFT done today. 

## 2017-02-04 NOTE — Patient Instructions (Addendum)
For drainage/ cough > zyrtec 10 mg one daily (over the counter)   NO MINT OR MENTHOL PRODUCTS SO NO COUGH DROPS   USE SUGARLESS CANDY INSTEAD (Jolley ranchers or Stover's or Astronomer) or even ice chips will also do - the key is to swallow to prevent all throat clearing. NO OIL BASED VITAMINS - use powdered substitutes.  Please schedule a follow up visit in 3 months but call sooner if needed with 6 minute walk and cxr on return - add needs collagen vasc screen on return

## 2017-02-04 NOTE — Progress Notes (Signed)
Subjective:    Patient ID: Vernon Richard, male    DOB: 1950-10-29,    MRN: [id number]   Brief patient profile:  88 yowm never smoker very physically active with no limitations with indolent onset sob assoc with sniffles/ cough around nov 2017 referred to pulmonary clinic 12/18/2016 by Dr  Posey Rea      History of Present Illness  12/18/2016 1st [facility] office visit/ Vernon Richard   Chief Complaint  Patient presents with  . Pulmonary Consult    Dr. Sabra Heck or Dr. 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    02/04/2017  f/u ov/Vernon Richard re:  NSIP Chief Complaint  Patient presents with  . Follow-up    PFT's done today.  He denies any co's today.   overall 75% better since last ov  Cough is before bed and after stirring in am and attributed to pnds - has not tried otcs  No longer gasping for breath waling back and forth to MB, some sob with heavy exertion like hauling a wheelbarrow  No obvious day to day or daytime variability or assoc excess/ purulent sputum or mucus plugs or hemoptysis or cp or chest tightness, subjective wheeze or overt sinus or hb symptoms. No unusual exp hx or h/o childhood pna/ asthma or knowledge of premature birth.  Sleeping ok without nocturnal  or early am exacerbation  of respiratory  c/o's or need for noct saba. Also denies any obvious fluctuation of symptoms with weather or environmental changes or other aggravating or alleviating  factors except as outlined above   Current Medications, Allergies, Complete Past Medical History, Past Surgical History, Family History, and Social History were reviewed in [employer] record.  ROS  The following are not active complaints unless bolded sore throat, dysphagia, dental problems, itching, sneezing,  nasal congestion or excess/ purulent secretions, ear ache,   fever, chills, sweats, unintended wt loss, classically pleuritic or exertional cp,  orthopnea pnd or leg swelling, presyncope, palpitations, abdominal pain, anorexia, nausea, vomiting, diarrhea  or change in bowel or bladder habits, change in stools or urine, dysuria,hematuria,  rash, arthralgias, visual complaints, headache, numbness, weakness or ataxia or problems with walking or coordination,  change in mood/affect or memory.                       Objective:   Physical Exam   amb stoic wm nad    02/04/2017        192   12/18/16 186 lb (84.4 kg)  12/10/16 188 lb (85.3 kg)  11/20/16 187 lb 6.4 oz (85 kg)    Vital signs reviewed  - Note on arrival 02 sats  99% on RA     HEENT: nl dentition,  and oropharynx. Nl external ear canals without cough reflex - moderate bilateral non-specific turbinate edema  NECK :  without JVD/Nodes/TM/ nl carotid upstrokes bilaterally   LUNGS: no acc muscle use,  Nl contour chest with bilateral inps crackles  L > R but no cough on inspiration   CV:  RRR  no s3 or murmur or increase in P2, and no edema   ABD:  soft and nontender with nl inspiratory excursion in the supine position. No bruits or organomegaly appreciated, bowel sounds nl  MS:  Nl gait/ ext warm without deformities, calf tenderness, cyanosis  - no def clubbing No obvious joint restrictions   SKIN: warm and dry without lesions    NEURO:  alert, approp, nl sensorium with  no motor or cerebellar deficits apparent.     I personally reviewed images and agree with radiology impression  as follows:  CT w/o contrast Chest  12/11/16 . Findings on the recent chest radiographs appear to be chronic, and are compatible with interstitial lung disease. The overall appearance is nonspecific at this time, but favored to reflect chronic hypersensitivity pneumonitis. Strictly speaking, early usual interstitial pneumonia (UIP) is not excluded, however, and repeat high-resolution chest CT is recommended in 12 months to assess for temporal changes in the appearance of the lung parenchyma. In addition, outpatient referral to pulmonology for further evaluation is recommended.            Assessment & Plan:

## 2017-02-05 NOTE — Assessment & Plan Note (Addendum)
CT c/w nsip 01/08/17  - ESR 12/18/2016 =  18 and HSP serology Neg - PFT's  02/04/2017  FVC   2.46 (57%) s obst  study with DLCO  46/45c  % corrects to 86  % for alv volume   - 02/04/2017  Walked RA x 3 laps @ 185 ft each stopped due to  End of study, fast pace, mild  desat at end to 87% but no sob    I had an extended discussion with the patient/wife reviewing all relevant studies completed to date and  lasting 15 to 20 minutes of a 25 minute visit on the following ongoing concerns:   Clinically much improved though this may be due to stopping acei and not reflect any improvement in ILD since desats with ex  Need multiple points on the curve to establish natural hx but since this is not UIP there is no need to escalate care at this point beyond prudent gerd diet  - NSIP typically has a steroid responsive component and relatively good prognosis and assoc with collange vasc dz which may be occult in his case but will need collagen vasc screen on return   Each maintenance medication was reviewed in detail including most importantly the difference between maintenance and as needed and under what circumstances the prns are to be used.  Please see AVS for specific  Instructions which are unique to this visit and I personally typed out  which were reviewed in detail in writing with the patient and a copy provided.

## 2017-02-05 NOTE — Assessment & Plan Note (Addendum)
-   Allergy profile 3/1 /18 >  Eos 0.2 /  IgE  230 grass/box elders Try off altace for cough 12/18/2016 >>> improved  ov 02/04/2017 but still some pnds - 02/04/2017 rec trial of zyrtec as likely has seasonal rhinitis

## 2017-02-05 NOTE — Assessment & Plan Note (Signed)
12/18/2016 changed altace to avapro due to cough   Adequate control on present rx, reviewed in detail with pt > no change in rx needed    Although even in retrospect it may not be clear the ACEi contributed to the pt's symptoms,  Pt improved off them and adding them back at this point or in the future would risk confusion in interpretation of non-specific respiratory symptoms to which this patient is prone  ie  Better not to muddy the waters here.

## 2017-03-13 ENCOUNTER — Other Ambulatory Visit: Payer: Self-pay | Admitting: Family

## 2017-03-17 ENCOUNTER — Ambulatory Visit (INDEPENDENT_AMBULATORY_CARE_PROVIDER_SITE_OTHER): Payer: BLUE CROSS/BLUE SHIELD | Admitting: Family

## 2017-03-17 ENCOUNTER — Encounter: Payer: Self-pay | Admitting: Family

## 2017-03-17 VITALS — BP 124/74 | HR 68 | Temp 98.6°F | Ht 68.5 in | Wt 190.6 lb

## 2017-03-17 DIAGNOSIS — E782 Mixed hyperlipidemia: Secondary | ICD-10-CM

## 2017-03-17 DIAGNOSIS — M1990 Unspecified osteoarthritis, unspecified site: Secondary | ICD-10-CM

## 2017-03-17 DIAGNOSIS — I10 Essential (primary) hypertension: Secondary | ICD-10-CM

## 2017-03-17 DIAGNOSIS — Z23 Encounter for immunization: Secondary | ICD-10-CM

## 2017-03-17 DIAGNOSIS — Z1211 Encounter for screening for malignant neoplasm of colon: Secondary | ICD-10-CM | POA: Diagnosis not present

## 2017-03-17 DIAGNOSIS — Z1159 Encounter for screening for other viral diseases: Secondary | ICD-10-CM | POA: Diagnosis not present

## 2017-03-17 NOTE — Patient Instructions (Signed)
Arthritis Arthritis is a term that is commonly used to refer to joint pain or joint disease. There are more than 100 types of arthritis. What are the causes? The most common cause of this condition is wear and tear of a joint. Other causes include:  Gout.  Inflammation of a joint.  An infection of a joint.  Sprains and other injuries near the joint.  A drug reaction or allergic reaction. In some cases, the cause may not be known. What are the signs or symptoms? The main symptom of this condition is pain in the joint with movement. Other symptoms include:  Redness, swelling, or stiffness at a joint.  Warmth coming from the joint.  Fever.  Overall feeling of illness. How is this diagnosed? This condition may be diagnosed with a physical exam and tests, including:  Blood tests.  Urine tests.  Imaging tests, such as MRI, X-rays, or a CT scan. Sometimes, fluid is removed from a joint for testing. How is this treated? Treatment for this condition may involve:  Treatment of the cause, if it is known.  Rest.  Raising (elevating) the joint.  Applying cold or hot packs to the joint.  Medicines to improve symptoms and reduce inflammation.  Injections of a steroid such as cortisone into the joint to help reduce pain and inflammation. Depending on the cause of your arthritis, you may need to make lifestyle changes to reduce stress on your joint. These changes may include exercising more and losing weight. Follow these instructions at home: Medicines   Take over-the-counter and prescription medicines only as told by your health care provider.  Do not take aspirin to relieve pain if gout is suspected. Activity   Rest your joint if told by your health care provider. Rest is important when your disease is active and your joint feels painful, swollen, or stiff.  Avoid activities that make the pain worse. It is important to balance activity with rest.  Exercise your joint  regularly with range-of-motion exercises as told by your health care provider. Try doing low-impact exercise, such as:  Swimming.  Water aerobics.  Biking.  Walking. Joint Care    If your joint is swollen, keep it elevated if told by your health care provider.  If your joint feels stiff in the morning, try taking a warm shower.  If directed, apply heat to the joint. If you have diabetes, do not apply heat without permission from your health care provider.  Put a towel between the joint and the hot pack or heating pad.  Leave the heat on the area for 20-30 minutes.  If directed, apply ice to the joint:  Put ice in a plastic bag.  Place a towel between your skin and the bag.  Leave the ice on for 20 minutes, 2-3 times per day.  Keep all follow-up visits as told by your health care provider. This is important. Contact a health care provider if:  The pain gets worse.  You have a fever. Get help right away if:  You develop severe joint pain, swelling, or redness.  Many joints become painful and swollen.  You develop severe back pain.  You develop severe weakness in your leg.  You cannot control your bladder or bowels. This information is not intended to replace advice given to you by your health care provider. Make sure you discuss any questions you have with your health care provider. Document Released: 11/13/2004 Document Revised: 03/13/2016 Document Reviewed: 01/01/2015 Elsevier Interactive Patient Education    2017 Elsevier Inc.  

## 2017-03-17 NOTE — Progress Notes (Signed)
   Subjective:    Patient ID: Vernon Richard, male    DOB: 02/05/1951, 66 y.o.   MRN: [id number]  Pt presents to the office today for chronic follow up. Pt has seen a Pulmonologist's for nonspecific interstitial pneumonia. Pt had a CT on 12/11/16 that recommends follow up in 12 months. Pt reminded of this and told to make an appt for 02/19.  Hypertension  This is a chronic problem. The current episode started more than 1 year ago. The problem has been resolved since onset. The problem is controlled. Pertinent negatives include no blurred vision, headaches, peripheral edema or shortness of breath. Risk factors for coronary artery disease include dyslipidemia and male gender. The current treatment provides moderate improvement. There is no history of kidney disease, CAD/MI, CVA or heart failure.  Hyperlipidemia  This is a chronic problem. The current episode started more than 1 year ago. The problem is uncontrolled. Recent lipid tests were reviewed and are high. Exacerbating diseases include obesity. Pertinent negatives include no shortness of breath. Current antihyperlipidemic treatment includes statins. The current treatment provides mild improvement of lipids.  Arthritis  Presents for follow-up visit. He complains of pain. The symptoms have been stable. Affected locations include the right knee, left knee and left ankle. His pain is at a severity of 6/10.      Review of Systems  Eyes: Negative for blurred vision.  Respiratory: Negative for shortness of breath.   Musculoskeletal: Positive for arthritis.  Neurological: Negative for headaches.  All other systems reviewed and are negative.      Objective:   Physical Exam  Constitutional: He is oriented to person, place, and time. He appears well-developed and well-nourished. No distress.  HENT:  Head: Normocephalic.  Right Ear: External ear normal.  Left Ear: External ear normal.  Nose: Nose normal.  Mouth/Throat: Oropharynx is clear and  moist.  Eyes: Pupils are equal, round, and reactive to light. Right eye exhibits no discharge. Left eye exhibits no discharge.  Neck: Normal range of motion. Neck supple. No thyromegaly present.  Cardiovascular: Normal rate, regular rhythm, normal heart sounds and intact distal pulses.   No murmur heard. Pulmonary/Chest: Effort normal and breath sounds normal. No respiratory distress. He has no wheezes.  Abdominal: Soft. Bowel sounds are normal. He exhibits no distension. There is no tenderness.  Musculoskeletal: Normal range of motion. He exhibits no edema or tenderness.  Neurological: He is alert and oriented to person, place, and time.  Skin: Skin is warm and dry. No rash noted. No erythema.  Psychiatric: He has a normal mood and affect. His behavior is normal. Judgment and thought content normal.  Vitals reviewed.     BP 124/74   Pulse 68   Temp 98.6 F (37 C) (Oral)   Ht 5' 8.5" (1.74 m)   Wt 190 lb 9.6 oz (86.5 kg)   BMI 28.56 kg/m      Assessment & Plan:  1. Essential hypertension - CMP14+EGFR  2. Arthritis - CMP14+EGFR  3. Mixed hyperlipidemia - CMP14+EGFR - Lipid panel  4. Need for hepatitis C screening test - CMP14+EGFR - Hepatitis C antibody  5. Colon cancer screening - Fecal occult blood, imunochemical; Future   Continue all meds Labs pending Health Maintenance reviewed- Pneumonia vaccine given today Diet and exercise encouraged RTO 6 months   Evelina Dun, FNP

## 2017-03-17 NOTE — Addendum Note (Signed)
Addended by: Michaela Corner on: 03/17/2017 09:10 AM   Modules accepted: Orders

## 2017-03-18 ENCOUNTER — Ambulatory Visit: Payer: BLUE CROSS/BLUE SHIELD | Admitting: Family Medicine

## 2017-03-18 LAB — CMP14+EGFR
ALK PHOS: 60 IU/L (ref 39–117)
ALT: 22 IU/L (ref 0–44)
AST: 19 IU/L (ref 0–40)
Albumin/Globulin Ratio: 2 (ref 1.2–2.2)
Albumin: 4.3 g/dL (ref 3.6–4.8)
BILIRUBIN TOTAL: 0.8 mg/dL (ref 0.0–1.2)
BUN / CREAT RATIO: 17 (ref 10–24)
BUN: 14 mg/dL (ref 8–27)
CHLORIDE: 94 mmol/L — AB (ref 96–106)
CO2: 25 mmol/L (ref 18–29)
CREATININE: 0.83 mg/dL (ref 0.76–1.27)
Calcium: 9.4 mg/dL (ref 8.6–10.2)
GFR calc Af Amer: 106 mL/min/{1.73_m2} (ref >59)
GFR calc non Af Amer: 92 mL/min/{1.73_m2} (ref >59)
GLOBULIN, TOTAL: 2.2 g/dL (ref 1.5–4.5)
GLUCOSE: 112 mg/dL — AB (ref 65–99)
Potassium: 4 mmol/L (ref 3.5–5.2)
SODIUM: 133 mmol/L — AB (ref 134–144)
Total Protein: 6.5 g/dL (ref 6.0–8.5)

## 2017-03-18 LAB — HEPATITIS C ANTIBODY: Hep C Virus Ab: <0.1 s/co ratio (ref 0.0–0.9)

## 2017-03-18 LAB — LIPID PANEL
CHOLESTEROL TOTAL: 207 mg/dL — AB (ref 100–199)
Chol/HDL Ratio: 2.9 ratio (ref 0.0–5.0)
HDL: 72 mg/dL (ref >39)
LDL CALC: 107 mg/dL — AB (ref 0–99)
TRIGLYCERIDES: 138 mg/dL (ref 0–149)
VLDL Cholesterol Cal: 28 mg/dL (ref 5–40)

## 2017-03-31 ENCOUNTER — Encounter: Payer: Self-pay | Admitting: Family

## 2017-03-31 ENCOUNTER — Ambulatory Visit (INDEPENDENT_AMBULATORY_CARE_PROVIDER_SITE_OTHER): Payer: BLUE CROSS/BLUE SHIELD | Admitting: Family

## 2017-03-31 VITALS — BP 136/81 | HR 108 | Temp 100.1°F | Ht 68.5 in | Wt 196.6 lb

## 2017-03-31 DIAGNOSIS — R509 Fever, unspecified: Secondary | ICD-10-CM | POA: Diagnosis not present

## 2017-03-31 DIAGNOSIS — J209 Acute bronchitis, unspecified: Secondary | ICD-10-CM

## 2017-03-31 DIAGNOSIS — R6889 Other general symptoms and signs: Secondary | ICD-10-CM | POA: Diagnosis not present

## 2017-03-31 LAB — VERITOR FLU A/B WAIVED
INFLUENZA B: NEGATIVE
Influenza A: NEGATIVE

## 2017-03-31 MED ORDER — DOXYCYCLINE HYCLATE 100 MG PO TABS: 100.0000 mg | ORAL_TABLET | Freq: Two times a day (BID) | ORAL | 0 refills | Status: DC

## 2017-03-31 NOTE — Patient Instructions (Signed)

## 2017-03-31 NOTE — Progress Notes (Signed)
Subjective:    Patient ID: Vernon Richard, male    DOB: 08-Jun-1951, 66 y.o.   MRN: [id number]  HPI Patient comes into the office complaining of sore throat, fever, cough, chills, and general malaise since Sunday.  Patient has tried advil and ibuprofen with no relief.  Patient has been around several men at work who have been sick with respiratory symptoms, one diagnosed with the flu.  Patient states he has been having trouble sleeping since Sunday.   Review of Systems  Constitutional: Positive for chills, fatigue and fever. Negative for diaphoresis.  HENT: Positive for congestion, rhinorrhea (clear) and sore throat. Negative for ear pain, trouble swallowing and voice change.   Eyes: Positive for redness (bilaterally). Negative for itching.  Respiratory: Positive for cough (non-productive) and shortness of breath. Negative for wheezing.   Cardiovascular: Negative for chest pain and palpitations.  Gastrointestinal: Negative for abdominal distention, abdominal pain, nausea and vomiting.  Musculoskeletal: Negative for neck pain and neck stiffness.  Neurological: Positive for headaches (intermittent since Sunday, in forehead, described as achy). Negative for dizziness and light-headedness.  All other systems reviewed and are negative.      Objective:   Physical Exam  Constitutional: He is oriented to person, place, and time. He appears well-developed and well-nourished.  HENT:  Head: Normocephalic.  Right Ear: External ear normal.  Left Ear: External ear normal.  Mouth/Throat: Oropharynx is clear and moist.  Eyes: Pupils are equal, round, and reactive to light.  Neck: Normal range of motion. Neck supple. No JVD present. No thyromegaly present.  Cardiovascular: Normal rate, regular rhythm, normal heart sounds and intact distal pulses.   No murmur heard. Pulmonary/Chest: Effort normal. No respiratory distress. He has decreased breath sounds.  Abdominal: Soft. Bowel sounds are normal. He  exhibits no distension. There is no tenderness.  Musculoskeletal: Normal range of motion. He exhibits no edema.  Lymphadenopathy:    He has no cervical adenopathy.  Neurological: He is alert and oriented to person, place, and time.  Skin: Skin is warm and dry.  Psychiatric: He has a normal mood and affect. His behavior is normal. Judgment and thought content normal.    BP 136/81   Pulse (!) 108   Temp 100.1 F (37.8 C) (Oral)   Ht 5' 8.5" (1.74 m)   Wt 196 lb 9.6 oz (89.2 kg)   BMI 29.46 kg/m      Assessment & Plan:  1. Flu-like symptoms - Veritor Flu A/B Waived  2. Fever, unspecified fever cause - doxycycline (VIBRA-TABS) 100 MG tablet; Take 1 tablet (100 mg total) by mouth 2 (two) times daily.  Dispense: 20 tablet; Refill: 0  3. Acute bronchitis, unspecified organism - Take meds as prescribed - Use a cool mist humidifier  -Use saline nose sprays frequently -Saline irrigations of the nose can be very helpful if done frequently.  * 4X daily for 1 week*  * Use of a nettie pot can be helpful with this. Follow directions with this* -Force fluids -For any cough or congestion  Use plain Mucinex- regular strength or max strength is fine   * Children- consult with Pharmacist for dosing -For fever or aces or pains- take tylenol or ibuprofen appropriate for age and weight.  * for fevers greater than 101 orally you may alternate ibuprofen and tylenol every  3 hours. -Throat lozenges if help - doxycycline (VIBRA-TABS) 100 MG tablet; Take 1 tablet (100 mg total) by mouth 2 (two) times daily.  Dispense: 20 tablet;  Refill: 0  Evelina Dun, FNP

## 2017-04-12 ENCOUNTER — Other Ambulatory Visit: Payer: Self-pay | Admitting: Family

## 2017-05-06 ENCOUNTER — Ambulatory Visit: Payer: BLUE CROSS/BLUE SHIELD | Admitting: Internal Medicine

## 2017-08-18 ENCOUNTER — Encounter: Payer: Self-pay | Admitting: *Deleted

## 2017-09-17 ENCOUNTER — Ambulatory Visit: Payer: BLUE CROSS/BLUE SHIELD | Admitting: Family

## 2017-09-21 ENCOUNTER — Encounter: Payer: Self-pay | Admitting: Family

## 2017-09-21 ENCOUNTER — Ambulatory Visit: Payer: BLUE CROSS/BLUE SHIELD | Admitting: Family

## 2017-09-21 VITALS — BP 127/73 | HR 90 | Temp 97.3°F | Ht 68.5 in | Wt 188.0 lb

## 2017-09-21 DIAGNOSIS — I700 Atherosclerosis of aorta: Secondary | ICD-10-CM | POA: Insufficient documentation

## 2017-09-21 DIAGNOSIS — J8489 Other specified interstitial pulmonary diseases: Secondary | ICD-10-CM

## 2017-09-21 DIAGNOSIS — Z23 Encounter for immunization: Secondary | ICD-10-CM | POA: Diagnosis not present

## 2017-09-21 DIAGNOSIS — I2510 Atherosclerotic heart disease of native coronary artery without angina pectoris: Secondary | ICD-10-CM | POA: Diagnosis not present

## 2017-09-21 DIAGNOSIS — E782 Mixed hyperlipidemia: Secondary | ICD-10-CM | POA: Diagnosis not present

## 2017-09-21 DIAGNOSIS — R05 Cough: Secondary | ICD-10-CM

## 2017-09-21 DIAGNOSIS — G4700 Insomnia, unspecified: Secondary | ICD-10-CM | POA: Diagnosis not present

## 2017-09-21 DIAGNOSIS — R059 Cough, unspecified: Secondary | ICD-10-CM

## 2017-09-21 DIAGNOSIS — M1990 Unspecified osteoarthritis, unspecified site: Secondary | ICD-10-CM

## 2017-09-21 DIAGNOSIS — I10 Essential (primary) hypertension: Secondary | ICD-10-CM | POA: Diagnosis not present

## 2017-09-21 LAB — CMP14+EGFR
ALBUMIN: 4.6 g/dL (ref 3.6–4.8)
ALT: 20 IU/L (ref 0–44)
AST: 18 IU/L (ref 0–40)
Albumin/Globulin Ratio: 2.3 — ABNORMAL HIGH (ref 1.2–2.2)
Alkaline Phosphatase: 68 IU/L (ref 39–117)
BUN/Creatinine Ratio: 15 (ref 10–24)
BUN: 13 mg/dL (ref 8–27)
Bilirubin Total: 0.6 mg/dL (ref 0.0–1.2)
CALCIUM: 9.2 mg/dL (ref 8.6–10.2)
CO2: 21 mmol/L (ref 20–29)
CREATININE: 0.88 mg/dL (ref 0.76–1.27)
Chloride: 96 mmol/L (ref 96–106)
GFR calc Af Amer: 103 mL/min/{1.73_m2} (ref >59)
GFR, EST NON AFRICAN AMERICAN: 89 mL/min/{1.73_m2} (ref >59)
Globulin, Total: 2 g/dL (ref 1.5–4.5)
Glucose: 97 mg/dL (ref 65–99)
Potassium: 3.8 mmol/L (ref 3.5–5.2)
Sodium: 133 mmol/L — ABNORMAL LOW (ref 134–144)
Total Protein: 6.6 g/dL (ref 6.0–8.5)

## 2017-09-21 LAB — LIPID PANEL
CHOL/HDL RATIO: 3.9 ratio (ref 0.0–5.0)
Cholesterol, Total: 222 mg/dL — ABNORMAL HIGH (ref 100–199)
HDL: 57 mg/dL (ref >39)
LDL CALC: 138 mg/dL — AB (ref 0–99)
TRIGLYCERIDES: 133 mg/dL (ref 0–149)
VLDL CHOLESTEROL CAL: 27 mg/dL (ref 5–40)

## 2017-09-21 MED ORDER — TRAZODONE HCL 50 MG PO TABS: 50.0000 mg | ORAL_TABLET | Freq: Every evening | ORAL | 3 refills | Status: DC | PRN

## 2017-09-21 NOTE — Patient Instructions (Signed)
Cough, Adult Coughing is a reflex that clears your throat and your airways. Coughing helps to heal and protect your lungs. It is normal to cough occasionally, but a cough that happens with other symptoms or lasts a long time may be a sign of a condition that needs treatment. A cough may last only 2-3 weeks (acute), or it may last longer than 8 weeks (chronic). What are the causes? Coughing is commonly caused by:  Breathing in substances that irritate your lungs.  A viral or bacterial respiratory infection.  Allergies.  Asthma.  Postnasal drip.  Smoking.  Acid backing up from the stomach into the esophagus (gastroesophageal reflux).  Certain medicines.  Chronic lung problems, including COPD (or rarely, lung cancer).  Other medical conditions such as heart failure.  Follow these instructions at home: Pay attention to any changes in your symptoms. Take these actions to help with your discomfort:  Take medicines only as told by your health care provider. ? If you were prescribed an antibiotic medicine, take it as told by your health care provider. Do not stop taking the antibiotic even if you start to feel better. ? Talk with your health care provider before you take a cough suppressant medicine.  Drink enough fluid to keep your urine clear or pale yellow.  If the air is dry, use a cold steam vaporizer or humidifier in your bedroom or your home to help loosen secretions.  Avoid anything that causes you to cough at work or at home.  If your cough is worse at night, try sleeping in a semi-upright position.  Avoid cigarette smoke. If you smoke, quit smoking. If you need help quitting, ask your health care provider.  Avoid caffeine.  Avoid alcohol.  Rest as needed.  Contact a health care provider if:  You have new symptoms.  You cough up pus.  Your cough does not get better after 2-3 weeks, or your cough gets worse.  You cannot control your cough with suppressant  medicines and you are losing sleep.  You develop pain that is getting worse or pain that is not controlled with pain medicines.  You have a fever.  You have unexplained weight loss.  You have night sweats. Get help right away if:  You cough up blood.  You have difficulty breathing.  Your heartbeat is very fast. This information is not intended to replace advice given to you by your health care provider. Make sure you discuss any questions you have with your health care provider. Document Released: 04/04/2011 Document Revised: 03/13/2016 Document Reviewed: 12/13/2014 Elsevier Interactive Patient Education  2017 Elsevier Inc.  

## 2017-09-21 NOTE — Addendum Note (Signed)
Addended by: Evelina Dun A on: 09/21/2017 09:21 AM   Modules accepted: Orders

## 2017-09-21 NOTE — Progress Notes (Addendum)
Subjective:    Patient ID: Vernon Richard, male    DOB: 09/15/1951, 66 y.o.   MRN: [id number]  Pt presents to the office today for chronic follow up. Pt has seen a Pulmonologist's for nonspecific interstitial pneumonia. Pt had a CT on 12/11/16 that recommends follow up in 12 months. Pt complaining of intermittent nonproductive cough since that diagnoses of pneumonia. CT scan also showed CAD and aortic atherosclerosis. Pt on statin, but never has seen Cardiologists.   Pulmonologist's took pt off ACE and told to take daily zyrtec. Pt's allergy profile positive for grass. 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 Does not wear mask while doing either of these.  Hypertension  This is a chronic problem. The current episode started more than 1 year ago. The problem has been resolved since onset. The problem is controlled. Pertinent negatives include no headaches, malaise/fatigue, peripheral edema or shortness of breath. Risk factors for coronary artery disease include dyslipidemia, male gender and sedentary lifestyle. The current treatment provides moderate improvement. There is no history of kidney disease, CAD/MI or heart failure. There is no history of chronic renal disease.  Arthritis  Presents for follow-up visit. He complains of pain and stiffness. The symptoms have been stable. Affected locations include the right knee and left knee. His pain is at a severity of 8/10.  Hyperlipidemia  This is a chronic problem. The current episode started more than 1 year ago. The problem is uncontrolled. Recent lipid tests were reviewed and are high. He has no history of chronic renal disease. Pertinent negatives include no shortness of breath. Current antihyperlipidemic treatment includes statins. The current treatment provides moderate improvement of lipids. Risk factors for coronary artery disease include dyslipidemia and male sex.  Insomnia  Primary symptoms: no difficulty falling asleep, frequent  awakening, premature morning awakening, no malaise/fatigue.  The onset quality is gradual. The problem occurs intermittently. The problem has been waxing and waning since onset.      Review of Systems  Constitutional: Negative for malaise/fatigue.  Respiratory: Negative for shortness of breath.   Musculoskeletal: Positive for arthritis and stiffness.  Neurological: Negative for headaches.  Psychiatric/Behavioral: The patient has insomnia.   All other systems reviewed and are negative.      Objective:   Physical Exam  Constitutional: He is oriented to person, place, and time. He appears well-developed and well-nourished. No distress.  HENT:  Head: Normocephalic.  Right Ear: External ear normal.  Left Ear: External ear normal.  Nose: Nose normal.  Mouth/Throat: Oropharynx is clear and moist.  Eyes: Pupils are equal, round, and reactive to light. Right eye exhibits no discharge. Left eye exhibits no discharge.  Neck: Normal range of motion. Neck supple. No thyromegaly present.  Cardiovascular: Normal rate, regular rhythm, normal heart sounds and intact distal pulses.  No murmur heard. Pulmonary/Chest: Effort normal and breath sounds normal. No respiratory distress. He has no wheezes.  Abdominal: Soft. Bowel sounds are normal. He exhibits no distension. There is no tenderness.  Musculoskeletal: Normal range of motion. He exhibits no edema or tenderness.  Neurological: He is alert and oriented to person, place, and time.  Skin: Skin is warm and dry. No rash noted. No erythema.  Psychiatric: He has a normal mood and affect. His behavior is normal. Judgment and thought content normal.  Vitals reviewed.    BP 127/73   Pulse 90   Temp (!) 97.3 F (36.3 C) (Oral)   Ht 5' 8.5" (1.74 m)  Wt 188 lb (85.3 kg)   BMI 28.17 kg/m      Assessment & Plan:  1. Essential hypertension - CMP14+EGFR  2. Mixed hyperlipidemia - CMP14+EGFR - Lipid panel  3. Arthritis - CMP14+EGFR  4.  Aortic atherosclerosis (HCC) - CMP14+EGFR - Lipid panel  5. Coronary artery disease involving native heart without angina pectoris, unspecified vessel or lesion type - CMP14+EGFR - Lipid panel  6. Cough -Continue zyrtec and wear mask while cutting grass and feeding animals - CT Chest Wo Contrast; Future - CMP14+EGFR  7. NSIP (nonspecific interstitial pneumonia) ([place]) - CT Chest Wo Contrast; Future - CMP14+EGFR  8. Insomnia, unspecified type PT started on trazodone  Sleep ritual discussed - traZODone (DESYREL) 50 MG tablet; Take 1-2 tablets (50-100 mg total) by mouth at bedtime as needed for sleep.  Dispense: 60 tablet; Refill: 3   Continue all meds Labs pending Health Maintenance reviewed Diet and exercise encouraged RTO 6 months   Evelina Dun, FNP

## 2017-09-22 ENCOUNTER — Other Ambulatory Visit: Payer: Self-pay | Admitting: Family

## 2017-09-22 MED ORDER — ATORVASTATIN CALCIUM 40 MG PO TABS
40.0000 mg | ORAL_TABLET | Freq: Every day | ORAL | 1 refills | Status: DC
Start: 2017-09-22 — End: 2017-11-04

## 2017-09-22 NOTE — Progress Notes (Signed)
Patient is aware 

## 2017-09-28 ENCOUNTER — Ambulatory Visit (HOSPITAL_COMMUNITY): Admission: RE | Admit: 2017-09-28 | Payer: BLUE CROSS/BLUE SHIELD | Source: Ambulatory Visit

## 2017-09-30 ENCOUNTER — Ambulatory Visit (INDEPENDENT_AMBULATORY_CARE_PROVIDER_SITE_OTHER): Payer: BLUE CROSS/BLUE SHIELD

## 2017-09-30 ENCOUNTER — Encounter: Payer: Self-pay | Admitting: Pediatrics

## 2017-09-30 ENCOUNTER — Ambulatory Visit: Payer: BLUE CROSS/BLUE SHIELD | Admitting: Pediatrics

## 2017-09-30 VITALS — BP 135/82 | HR 88 | Temp 97.7°F | Resp 22 | Ht 68.5 in | Wt 190.0 lb

## 2017-09-30 DIAGNOSIS — J189 Pneumonia, unspecified organism: Secondary | ICD-10-CM

## 2017-09-30 DIAGNOSIS — R05 Cough: Secondary | ICD-10-CM

## 2017-09-30 DIAGNOSIS — R059 Cough, unspecified: Secondary | ICD-10-CM

## 2017-09-30 DIAGNOSIS — J849 Interstitial pulmonary disease, unspecified: Secondary | ICD-10-CM | POA: Diagnosis not present

## 2017-09-30 DIAGNOSIS — J181 Lobar pneumonia, unspecified organism: Secondary | ICD-10-CM

## 2017-09-30 MED ORDER — LEVOFLOXACIN 500 MG PO TABS: 500.0000 mg | ORAL_TABLET | Freq: Every day | ORAL | 0 refills | Status: DC

## 2017-09-30 NOTE — Progress Notes (Signed)
  Subjective:   Patient ID: Vernon Richard, male    DOB: 03-Dec-1950, 66 y.o.   MRN: [id number] CC: Cough and Shortness of Breath  HPI: Vernon Richard is a 66 y.o. male presenting for Cough and Shortness of Breath  Coughing in spurts, 2-3 hours cough may last for, coughed so much last night chest got sore Summertime not as bad Gets worse in the winter Ongoing for months Primarily dry cough  No recent fevers Has CT scan scheduled for next week for f/u ILD given worsening symptoms Wants 2nd opinion about lung disease Told he has ILD in the past Last saw pulm 01/2017 At that time had mild desaturations with walking to 87%, asymptomatic Is not on oxygen at home right now  Does sometimes get SOB walking up steps now Wife has noticed it more in the last few weeks  Works for [employer], multiple environmental exposures No recent fevers Appetite has been fine.  Relevant past medical, surgical, family and social history reviewed. Allergies and medications reviewed and updated. Social History   Tobacco Use  Smoking Status Never Smoker  Smokeless Tobacco Never Used   ROS: Per HPI   Objective:    BP 135/82   Pulse 88   Temp 97.7 F (36.5 C) (Oral)   Resp (!) 22   Ht 5' 8.5" (1.74 m)   Wt 190 lb (86.2 kg)   SpO2 96%   BMI 28.47 kg/m   Wt Readings from Last 3 Encounters:  09/30/17 190 lb (86.2 kg)  09/21/17 188 lb (85.3 kg)  03/31/17 196 lb 9.6 oz (89.2 kg)  desats to 91% first lap, 79% second lap after apprx 2 min total good-paced walking Asymptomatic, no SOB  Gen: NAD, alert, cooperative with exam, NCAT EYES: EOMI, no conjunctival injection, or no icterus ENT:  TMs pearly gray b/l, OP without erythema LYMPH: no cervical LAD CV: NRRR, normal S1/S2, no murmur, distal pulses 2+ b/l Resp: crackles b/l, R>L, no wheezes, normal WOB, speaking in complete sentences Abd: +BS, soft, NTND. no guarding or organomegaly Ext: No edema, warm Neuro: Alert and oriented, strength equal b/l UE  and LE, coordination grossly normal MSK: normal muscle bulk  CXR: worsening opacity LUL, with ILD  Assessment & Plan:  Vernon Richard was seen today for cough and shortness of breath.  Diagnoses and all orders for this visit:  Cough Worsening opacity left upper lobe, will treat with levofloxacin given symptoms of cough and shortness of breath Unclear if it is infectious  Referral into pulmonology as below for chronic lung problems -     DG Chest 2 View; Future  Community acquired pneumonia of left upper lobe of lung (HCC) -     levofloxacin (LEVAQUIN) 500 MG tablet; Take 1 tablet (500 mg total) by mouth daily.  Interstitial lung disease ([place]) -     Ambulatory referral to Pulmonology  Follow up plan: Return in about 5 weeks (around 11/04/2017). Assunta Found, MD [place]

## 2017-10-05 ENCOUNTER — Ambulatory Visit (HOSPITAL_COMMUNITY)
Admission: RE | Admit: 2017-10-05 | Discharge: 2017-10-05 | Disposition: A | Discharge status: Home / Self Care | Payer: BLUE CROSS/BLUE SHIELD | Source: Ambulatory Visit | Attending: Family | Admitting: Family

## 2017-10-05 ENCOUNTER — Other Ambulatory Visit: Payer: Self-pay | Admitting: Family

## 2017-10-05 DIAGNOSIS — R05 Cough: Secondary | ICD-10-CM | POA: Insufficient documentation

## 2017-10-05 DIAGNOSIS — J8489 Other specified interstitial pulmonary diseases: Secondary | ICD-10-CM

## 2017-10-05 DIAGNOSIS — R059 Cough, unspecified: Secondary | ICD-10-CM

## 2017-10-05 DIAGNOSIS — J849 Interstitial pulmonary disease, unspecified: Secondary | ICD-10-CM | POA: Diagnosis not present

## 2017-10-05 DIAGNOSIS — I700 Atherosclerosis of aorta: Secondary | ICD-10-CM | POA: Diagnosis not present

## 2017-10-05 DIAGNOSIS — I2510 Atherosclerotic heart disease of native coronary artery without angina pectoris: Secondary | ICD-10-CM | POA: Diagnosis not present

## 2017-10-06 ENCOUNTER — Other Ambulatory Visit: Payer: Self-pay | Admitting: Family

## 2017-10-07 ENCOUNTER — Telehealth: Payer: Self-pay | Admitting: Pediatrics

## 2017-10-07 ENCOUNTER — Telehealth: Payer: Self-pay | Admitting: *Deleted

## 2017-10-07 NOTE — Telephone Encounter (Signed)
error 

## 2017-10-07 NOTE — Telephone Encounter (Signed)
Spoke with pt's wife regarding CT results Wife states pt is having wheezing, SOB, and cough with exertion or when he goes out in the cold air Pt is waiting for pulmonary referal Would like RX for inhaler to see if this relieves pt's symptoms

## 2017-10-08 ENCOUNTER — Other Ambulatory Visit: Payer: Self-pay | Admitting: Pediatrics

## 2017-10-08 MED ORDER — PREDNISONE 10 MG PO TABS: ORAL_TABLET | ORAL | 0 refills | Status: DC

## 2017-10-08 NOTE — Telephone Encounter (Signed)
I sent in a steroid taper to his pharmacy. I think this is more likely to help than an inhaler. His lung function test was not improved with an inhaler back in April. An inhaler is going to help with airway problems. His lungs have an oxygen exchange problem at the alveolar level. The oral steroids may help stop inflammation and improve the oxygen exchange and his breathing. We do want him to get in to see pulmonologist as soon as possible, working on that referral. Would probably be able to get him in to see Dr. Melvyn Novas sooner if symptoms are changing a lot and he needs to be seen as he has seen him before.

## 2017-10-09 MED ORDER — ALBUTEROL SULFATE HFA 108 (90 BASE) MCG/ACT IN AERS: 2.0000 | INHALATION_SPRAY | Freq: Four times a day (QID) | RESPIRATORY_TRACT | 2 refills | Status: DC | PRN

## 2017-10-09 NOTE — Telephone Encounter (Signed)
Pt's wife notified of recommendation Verbalizes understanding Per Dr Evette Doffing okay for inhaler

## 2017-11-01 ENCOUNTER — Other Ambulatory Visit: Payer: Self-pay | Admitting: Family

## 2017-11-04 ENCOUNTER — Ambulatory Visit (INDEPENDENT_AMBULATORY_CARE_PROVIDER_SITE_OTHER): Payer: Medicare Other | Admitting: Pediatrics

## 2017-11-04 ENCOUNTER — Encounter: Payer: Self-pay | Admitting: Pediatrics

## 2017-11-04 VITALS — BP 132/74 | HR 101 | Temp 97.0°F | Ht 68.5 in | Wt 186.0 lb

## 2017-11-04 DIAGNOSIS — J849 Interstitial pulmonary disease, unspecified: Secondary | ICD-10-CM

## 2017-11-04 DIAGNOSIS — K5900 Constipation, unspecified: Secondary | ICD-10-CM

## 2017-11-04 MED ORDER — SPACER/AERO CHAMBER MOUTHPIECE MISC: 1.0000 | Freq: Four times a day (QID) | 0 refills | Status: DC | PRN

## 2017-11-04 NOTE — Progress Notes (Signed)
  Subjective:   Patient ID: Vernon Richard, male    DOB: December 02, 1950, 67 y.o.   MRN: [id number] CC: Follow-up (5 week) Shortness of breath HPI: Vernon Richard is a 67 y.o. male presenting for Follow-up (5 week) Here today with his wife Patient with interstitial lung disease on CT scan, shortness of breath with exertion Has been getting worse over the last few months No chest pain or pressure, no swelling in his lower extremities Did trial of steroid burst and taper last visit with no improvement in symptoms Has appointment next week to see pulmonology  Has had some abdominal distention off and on No pain in his stomach Symptoms he does not feel like eating because he feels bloated sometimes Not having bowel movements every day, sometimes he does feel constipated  When he is not had a bowel movement the bloating feeling is worse. He is tried over-the-counter milk of magnesia and dislikes the side effects of loose stools and abd cramping  Last colonoscopy was in 2014, 2 polyps were found and repeat was recommended in 5 years, due later this year  Relevant past medical, surgical, family and social history reviewed. Allergies and medications reviewed and updated. Social History   Tobacco Use  Smoking Status Never Smoker  Smokeless Tobacco Never Used   ROS: Per HPI   Objective:    BP 132/74   Pulse (!) 101   Temp (!) 97 F (36.1 C) (Oral)   Ht 5' 8.5" (1.74 m)   Wt 186 lb (84.4 kg)   BMI 27.87 kg/m    Wt Readings from Last 3 Encounters:  11/04/17 186 lb (84.4 kg)  09/30/17 190 lb (86.2 kg)  09/21/17 188 lb (85.3 kg)    Gen: NAD, alert, cooperative with exam, NCAT EYES: EOMI, no conjunctival injection, or no icterus ENT:  TMs pearly gray b/l, OP without erythema LYMPH: no cervical LAD CV: NRRR, normal S1/S2, no murmur, distal pulses 2+ b/l Resp: Crackles bilateral bases, no wheezes, normal WOB Abd: +BS, soft, NTND. no guarding or organomegaly Ext: No edema, warm Neuro: Alert and  oriented, strength equal b/l UE and LE, coordination grossly normal MSK: normal muscle bulk  Assessment & Plan:  Vernon Richard was seen today for follow-up shortness of breath.  Diagnoses and all orders for this visit:  Interstitial lung disease ([place]) Needs follow-up with pulmonology, scheduled for next week CD with his imaging scans was made today and given to patient to take to his appointment Most likely his lung disease as the cause of his shortness of breath with exertion.  Patient and his wife asked multiple questions about whether his heart may be involved and contributing to his symptoms.  Patient with no chest pain or swelling, normal-sized heart on CT scan.  Will start with pulmonology evaluation.  Return precautions discussed.  Constipation, unspecified constipation type Discussed increasing water intake, fiber intake.  Can use MiraLAX as needed.  Will need referral to GI for 5-year follow-up colonoscopy later this year  Follow up plan: Return in about 5 weeks (around 12/09/2017). Assunta Found, MD [place]

## 2017-11-05 ENCOUNTER — Encounter: Payer: Self-pay | Admitting: Pediatrics

## 2017-11-11 DIAGNOSIS — J9611 Chronic respiratory failure with hypoxia: Secondary | ICD-10-CM | POA: Diagnosis not present

## 2017-11-11 DIAGNOSIS — R05 Cough: Secondary | ICD-10-CM | POA: Diagnosis not present

## 2017-11-11 DIAGNOSIS — J309 Allergic rhinitis, unspecified: Secondary | ICD-10-CM | POA: Diagnosis not present

## 2017-11-11 DIAGNOSIS — J849 Interstitial pulmonary disease, unspecified: Secondary | ICD-10-CM | POA: Diagnosis not present

## 2017-11-13 DIAGNOSIS — J849 Interstitial pulmonary disease, unspecified: Secondary | ICD-10-CM | POA: Diagnosis not present

## 2017-11-14 ENCOUNTER — Other Ambulatory Visit: Payer: Self-pay | Admitting: Pediatrics

## 2017-11-16 ENCOUNTER — Ambulatory Visit (INDEPENDENT_AMBULATORY_CARE_PROVIDER_SITE_OTHER): Payer: Medicare Other | Admitting: Pediatrics

## 2017-11-16 ENCOUNTER — Encounter: Payer: Self-pay | Admitting: Pediatrics

## 2017-11-16 VITALS — BP 124/67 | HR 97 | Temp 97.1°F | Ht 68.5 in | Wt 185.8 lb

## 2017-11-16 DIAGNOSIS — K5900 Constipation, unspecified: Secondary | ICD-10-CM

## 2017-11-16 DIAGNOSIS — M5432 Sciatica, left side: Secondary | ICD-10-CM

## 2017-11-16 MED ORDER — CYCLOBENZAPRINE HCL 5 MG PO TABS
5.0000 mg | ORAL_TABLET | Freq: Three times a day (TID) | ORAL | 1 refills | Status: DC | PRN
Start: 2017-11-16 — End: 2018-05-06

## 2017-11-16 MED ORDER — LINACLOTIDE 72 MCG PO CAPS: 72.0000 ug | ORAL_CAPSULE | Freq: Every day | ORAL | Status: DC

## 2017-11-16 MED ORDER — KETOROLAC TROMETHAMINE 60 MG/2ML IM SOLN
30.0000 mg | Freq: Once | INTRAMUSCULAR | Status: AC
  Administered 2017-11-16: 30 mg via INTRAMUSCULAR

## 2017-11-16 NOTE — Telephone Encounter (Signed)
Seen 11/04/17  Dr Evette Doffing

## 2017-11-16 NOTE — Patient Instructions (Signed)
Take flexeril (muscle relaxer) as needed at night Do not take and drive, can cause sleepiness  Take aleve twice a day  Gentle back exercises, heat, ice as needed   Back Exercises If you have pain in your back, do these exercises 2-3 times each day or as told by your doctor. When the pain goes away, do the exercises once each day, but repeat the steps more times for each exercise (do more repetitions). If you do not have pain in your back, do these exercises once each day or as told by your doctor. Exercises Single Knee to Chest  Do these steps 3-5 times in a row for each leg: 1. Lie on your back on a firm bed or the floor with your legs stretched out. 2. Bring one knee to your chest. 3. Hold your knee to your chest by grabbing your knee or thigh. 4. Pull on your knee until you feel a gentle stretch in your lower back. 5. Keep doing the stretch for 10-30 seconds. 6. Slowly let go of your leg and straighten it.  Pelvic Tilt  Do these steps 5-10 times in a row: 1. Lie on your back on a firm bed or the floor with your legs stretched out. 2. Bend your knees so they point up to the ceiling. Your feet should be flat on the floor. 3. Tighten your lower belly (abdomen) muscles to press your lower back against the floor. This will make your tailbone point up to the ceiling instead of pointing down to your feet or the floor. 4. Stay in this position for 5-10 seconds while you gently tighten your muscles and breathe evenly.  Cat-Cow  Do these steps until your lower back bends more easily: 1. Get on your hands and knees on a firm surface. Keep your hands under your shoulders, and keep your knees under your hips. You may put padding under your knees. 2. Let your head hang down, and make your tailbone point down to the floor so your lower back is round like the back of a cat. 3. Stay in this position for 5 seconds. 4. Slowly lift your head and make your tailbone point up to the ceiling so your  back hangs low (sags) like the back of a cow. 5. Stay in this position for 5 seconds.  Press-Ups  Do these steps 5-10 times in a row: 1. Lie on your belly (face-down) on the floor. 2. Place your hands near your head, about shoulder-width apart. 3. While you keep your back relaxed and keep your hips on the floor, slowly straighten your arms to raise the top half of your body and lift your shoulders. Do not use your back muscles. To make yourself more comfortable, you may change where you place your hands. 4. Stay in this position for 5 seconds. 5. Slowly return to lying flat on the floor.  Bridges  Do these steps 10 times in a row: 1. Lie on your back on a firm surface. 2. Bend your knees so they point up to the ceiling. Your feet should be flat on the floor. 3. Tighten your butt muscles and lift your butt off of the floor until your waist is almost as high as your knees. If you do not feel the muscles working in your butt and the back of your thighs, slide your feet 1-2 inches farther away from your butt. 4. Stay in this position for 3-5 seconds. 5. Slowly lower your butt to the floor,  and let your butt muscles relax.  If this exercise is too easy, try doing it with your arms crossed over your chest. Back Lifts Do these steps 5-10 times in a row: 1. Lie on your belly (face-down) with your arms at your sides, and rest your forehead on the floor. 2. Tighten the muscles in your legs and your butt. 3. Slowly lift your chest off of the floor while you keep your hips on the floor. Keep the back of your head in line with the curve in your back. Look at the floor while you do this. 4. Stay in this position for 3-5 seconds. 5. Slowly lower your chest and your face to the floor.  Contact a doctor if:  Your back pain gets a lot worse when you do an exercise.  Your back pain does not lessen 2 hours after you exercise. If you have any of these problems, stop doing the exercises. Do not do them  again unless your doctor says it is okay. Get help right away if:  You have sudden, very bad back pain. If this happens, stop doing the exercises. Do not do them again unless your doctor says it is okay. This information is not intended to replace advice given to you by your health care provider. Make sure you discuss any questions you have with your health care provider. Document Released: 11/08/2010 Document Revised: 03/13/2016 Document Reviewed: 11/30/2014 Elsevier Interactive Patient Education  [employer].

## 2017-11-16 NOTE — Progress Notes (Signed)
  Subjective:   Patient ID: Vernon Richard, male    DOB: November 02, 1950, 67 y.o.   MRN: [id number] CC: Back Pain (left side down hip, felt like leg was on fire last pm, no known injury, no able to sleep)  HPI: Vernon Richard is a 67 y.o. male presenting for Back Pain (left side down hip, felt like leg was on fire last pm, no known injury, no able to sleep)  Ongoing back/leg problems past week Feeling better now No change in activities, no exacerbating injury Tried prednisone at home, was given to him from last time this happened Did not help Felt like upper anterior L leg burning last night  Constipation: ongoing, tried ducolax with no improvement  No problem with weakness in leg, no trouble emptying bladder  Relevant past medical, surgical, family and social history reviewed. Allergies and medications reviewed and updated. Social History   Tobacco Use  Smoking Status Never Smoker  Smokeless Tobacco Never Used   ROS: Per HPI   Objective:    BP 124/67   Pulse 97   Temp (!) 97.1 F (36.2 C) (Oral)   Ht 5' 8.5" (1.74 m)   Wt 185 lb 12.8 oz (84.3 kg)   BMI 27.84 kg/m   Wt Readings from Last 3 Encounters:  11/16/17 185 lb 12.8 oz (84.3 kg)  11/04/17 186 lb (84.4 kg)  09/30/17 190 lb (86.2 kg)    Gen: NAD, alert, cooperative with exam, NCAT EYES: EOMI, no conjunctival injection, or no icterus CV: NRRR, normal S1/S2, no murmur, distal pulses 2+ b/l Resp: moving air well, crackles b/l bases, no wheezes, normal WOB Ext: No edema, warm Neuro: Alert and oriented, strength equal b/l UE and LE, coordination grossly normal, 2+ patellar reflexes b/l MSK: normal muscle bulk, no point tenderness over spine, neg SLR  Assessment & Plan:  [name] was seen today for back pain.  Diagnoses and all orders for this visit:  Constipation, unspecified constipation type No improvement with OTCs -     linaclotide (LINZESS) 72 MCG capsule; Take 1 capsule (72 mcg total) by mouth daily before  breakfast.  Sciatica of left side NSAIDs, rest, ice, heat, gentle back exercises toradol 30mg  today Trial of below for L lower back pain Return precautions discussed -     cyclobenzaprine (FLEXERIL) 5 MG tablet; Take 1 tablet (5 mg total) by mouth 3 (three) times daily as needed for muscle spasms.  Follow up plan: Return if symptoms worsen or fail to improve. Assunta Found, MD [place]

## 2017-11-19 ENCOUNTER — Other Ambulatory Visit: Payer: Self-pay | Admitting: *Deleted

## 2017-11-19 MED ORDER — IRBESARTAN 150 MG PO TABS: 150.0000 mg | ORAL_TABLET | Freq: Every day | ORAL | 1 refills | Status: DC

## 2017-11-23 DIAGNOSIS — J8410 Pulmonary fibrosis, unspecified: Secondary | ICD-10-CM | POA: Diagnosis not present

## 2017-11-23 DIAGNOSIS — J479 Bronchiectasis, uncomplicated: Secondary | ICD-10-CM | POA: Diagnosis not present

## 2017-11-23 DIAGNOSIS — R918 Other nonspecific abnormal finding of lung field: Secondary | ICD-10-CM | POA: Diagnosis not present

## 2017-11-23 DIAGNOSIS — J984 Other disorders of lung: Secondary | ICD-10-CM | POA: Diagnosis not present

## 2017-11-23 DIAGNOSIS — J849 Interstitial pulmonary disease, unspecified: Secondary | ICD-10-CM | POA: Diagnosis not present

## 2017-12-04 DIAGNOSIS — J84112 Idiopathic pulmonary fibrosis: Secondary | ICD-10-CM | POA: Diagnosis not present

## 2017-12-04 DIAGNOSIS — J849 Interstitial pulmonary disease, unspecified: Secondary | ICD-10-CM | POA: Diagnosis not present

## 2017-12-04 DIAGNOSIS — R05 Cough: Secondary | ICD-10-CM | POA: Diagnosis not present

## 2017-12-04 DIAGNOSIS — J9611 Chronic respiratory failure with hypoxia: Secondary | ICD-10-CM | POA: Diagnosis not present

## 2017-12-10 ENCOUNTER — Encounter: Payer: Self-pay | Admitting: Pediatrics

## 2017-12-10 ENCOUNTER — Ambulatory Visit (INDEPENDENT_AMBULATORY_CARE_PROVIDER_SITE_OTHER): Payer: Medicare Other | Admitting: Pediatrics

## 2017-12-10 VITALS — BP 118/69 | HR 110 | Temp 98.0°F | Resp 18 | Ht 68.5 in | Wt 189.6 lb

## 2017-12-10 DIAGNOSIS — J849 Interstitial pulmonary disease, unspecified: Secondary | ICD-10-CM

## 2017-12-10 NOTE — Progress Notes (Signed)
  Subjective:   Patient ID: Vernon Richard, male    DOB: 09/13/1951, 67 y.o.   MRN: [id number] CC: Follow-up ILD HPI: Vernon Richard is a 67 y.o. male presenting for Follow-up  Seen by pulmonology. Now on oxygen. No longer able to work due to exposure to hay, dust at work. Has f/u with pulm in 2 weeks, will be seeing specialist at [facility]. Slightly positive RF, neg anti-ccp, pt says her is seeing a rheumatologist in next weeks as well.   He has been feeling well other than a dry cough. Worst in the morning when he wakes up. Using flonase, antihistamine, PPI.  Relevant past medical, surgical, family and social history reviewed. Allergies and medications reviewed and updated. Social History   Tobacco Use  Smoking Status Never Smoker  Smokeless Tobacco Never Used   ROS: Per HPI   Objective:    BP 118/69   Pulse (!) 110   Temp 98 F (36.7 C) (Oral)   Resp 18   Ht 5' 8.5" (1.74 m)   Wt 189 lb 9.6 oz (86 kg)   SpO2 96%   BMI 28.41 kg/m   Wt Readings from Last 3 Encounters:  12/10/17 189 lb 9.6 oz (86 kg)  11/16/17 185 lb 12.8 oz (84.3 kg)  11/04/17 186 lb (84.4 kg)    Gen: NAD, alert, cooperative with exam, NCAT EYES: EOMI, no conjunctival injection, or no icterus ENT:  TMs pearly gray b/l, OP without erythema LYMPH: no cervical LAD CV: NRRR, normal S1/S2, no murmur, distal pulses 2+ b/l Resp: crackles bases b/l, moving air fair, comfortable WOB Abd: +BS, soft, NTND. no guarding or organomegaly Ext: No edema, warm Neuro: Alert and oriented, strength equal b/l UE and LE, coordination grossly normal MSK: normal muscle bulk  Assessment & Plan:  Vernon Richard was seen today for follow-up med problems.  Diagnoses and all orders for this visit:  ILD (interstitial lung disease) ([place]) Has upcoming appts for follow up. Says he will need paperwork filled out for work, not able to work right now due to dust exposures worsening symptoms.  Total time spent with the patient was 20 minutes with greater  than 50% of the time spent in face-to-face.   Follow up plan: Return in about 4 months (around 04/09/2018). Assunta Found, MD [place]

## 2017-12-14 DIAGNOSIS — J849 Interstitial pulmonary disease, unspecified: Secondary | ICD-10-CM | POA: Diagnosis not present

## 2017-12-21 DIAGNOSIS — Z9981 Dependence on supplemental oxygen: Secondary | ICD-10-CM | POA: Diagnosis not present

## 2017-12-21 DIAGNOSIS — R05 Cough: Secondary | ICD-10-CM | POA: Diagnosis not present

## 2017-12-21 DIAGNOSIS — J84112 Idiopathic pulmonary fibrosis: Secondary | ICD-10-CM | POA: Diagnosis not present

## 2017-12-25 DIAGNOSIS — J84112 Idiopathic pulmonary fibrosis: Secondary | ICD-10-CM | POA: Diagnosis not present

## 2017-12-29 DIAGNOSIS — Z0289 Encounter for other administrative examinations: Secondary | ICD-10-CM

## 2018-01-11 DIAGNOSIS — R768 Other specified abnormal immunological findings in serum: Secondary | ICD-10-CM | POA: Diagnosis not present

## 2018-01-11 DIAGNOSIS — J84112 Idiopathic pulmonary fibrosis: Secondary | ICD-10-CM | POA: Diagnosis not present

## 2018-01-11 DIAGNOSIS — L570 Actinic keratosis: Secondary | ICD-10-CM | POA: Diagnosis not present

## 2018-01-11 DIAGNOSIS — J849 Interstitial pulmonary disease, unspecified: Secondary | ICD-10-CM | POA: Diagnosis not present

## 2018-01-11 DIAGNOSIS — L821 Other seborrheic keratosis: Secondary | ICD-10-CM | POA: Diagnosis not present

## 2018-01-11 DIAGNOSIS — Z85828 Personal history of other malignant neoplasm of skin: Secondary | ICD-10-CM | POA: Diagnosis not present

## 2018-01-11 DIAGNOSIS — L814 Other melanin hyperpigmentation: Secondary | ICD-10-CM | POA: Diagnosis not present

## 2018-01-11 DIAGNOSIS — D1801 Hemangioma of skin and subcutaneous tissue: Secondary | ICD-10-CM | POA: Diagnosis not present

## 2018-02-11 DIAGNOSIS — J849 Interstitial pulmonary disease, unspecified: Secondary | ICD-10-CM | POA: Diagnosis not present

## 2018-03-01 DIAGNOSIS — R0602 Shortness of breath: Secondary | ICD-10-CM | POA: Diagnosis not present

## 2018-03-01 DIAGNOSIS — J849 Interstitial pulmonary disease, unspecified: Secondary | ICD-10-CM | POA: Diagnosis not present

## 2018-03-02 ENCOUNTER — Telehealth: Payer: Self-pay | Admitting: Pediatrics

## 2018-03-02 NOTE — Telephone Encounter (Signed)
Wife aware that patient will need pneumovax around 03/17/18

## 2018-03-04 DIAGNOSIS — R05 Cough: Secondary | ICD-10-CM | POA: Diagnosis not present

## 2018-03-04 DIAGNOSIS — Z9981 Dependence on supplemental oxygen: Secondary | ICD-10-CM | POA: Diagnosis not present

## 2018-03-04 DIAGNOSIS — J84112 Idiopathic pulmonary fibrosis: Secondary | ICD-10-CM | POA: Diagnosis not present

## 2018-03-04 DIAGNOSIS — J309 Allergic rhinitis, unspecified: Secondary | ICD-10-CM | POA: Diagnosis not present

## 2018-03-13 DIAGNOSIS — J849 Interstitial pulmonary disease, unspecified: Secondary | ICD-10-CM | POA: Diagnosis not present

## 2018-03-16 DIAGNOSIS — L821 Other seborrheic keratosis: Secondary | ICD-10-CM | POA: Diagnosis not present

## 2018-03-16 DIAGNOSIS — L579 Skin changes due to chronic exposure to nonionizing radiation, unspecified: Secondary | ICD-10-CM | POA: Diagnosis not present

## 2018-03-16 DIAGNOSIS — L570 Actinic keratosis: Secondary | ICD-10-CM | POA: Diagnosis not present

## 2018-03-16 DIAGNOSIS — L814 Other melanin hyperpigmentation: Secondary | ICD-10-CM | POA: Diagnosis not present

## 2018-03-23 ENCOUNTER — Ambulatory Visit: Payer: BLUE CROSS/BLUE SHIELD | Admitting: Family

## 2018-04-01 IMAGING — CT CT CHEST W/O CM
2 of 6 series · 13 of 36 positions shown, 16 images · non-contrast
Comparison: Chest CT 12/11/2016.

CLINICAL DATA: 66-year-old male with history of persistent cough
for the past 4-5 months. Abnormal chest x-ray. Evaluate for
pneumonia.

EXAM:
CT CHEST WITHOUT CONTRAST
TECHNIQUE: Multidetector CT imaging of the chest was performed following the
standard protocol without IV contrast.

[Series 2: thorax · axial · 0.75mm/px · z∈[-58,+198]mm · 12 of 146 slices shown, 15 images]
[im 9/146  mediastinal]
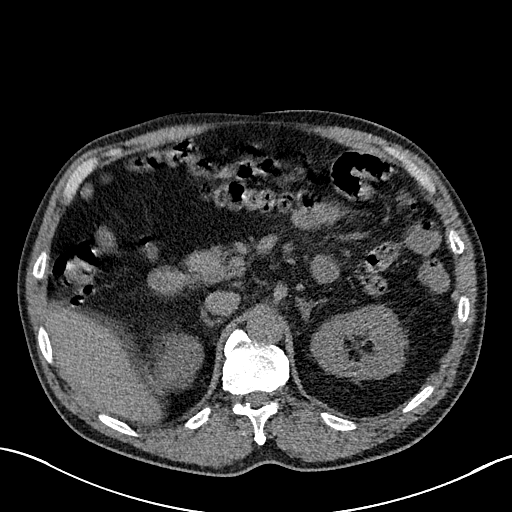
[im 9/146  lung]
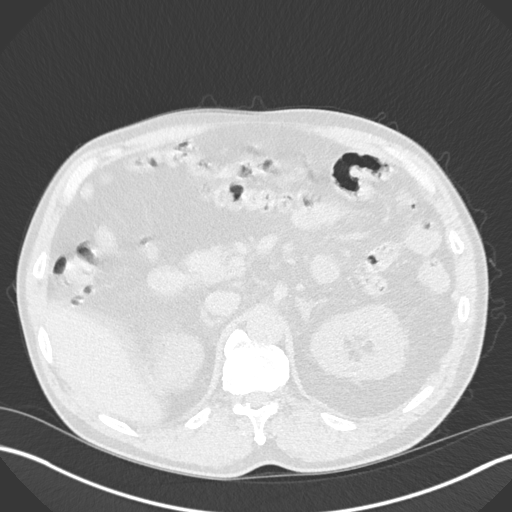
[im 25/146  lung]
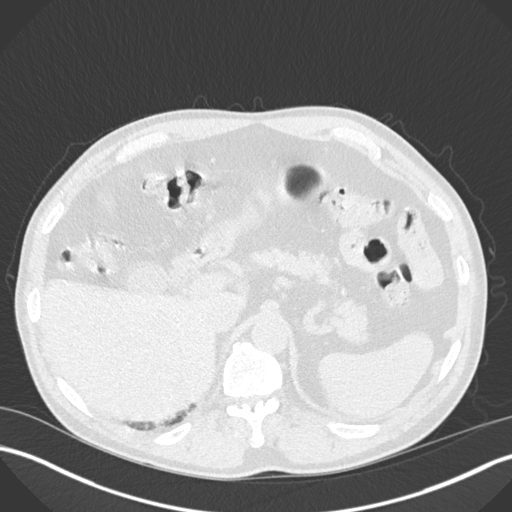
[im 33/146  lung]
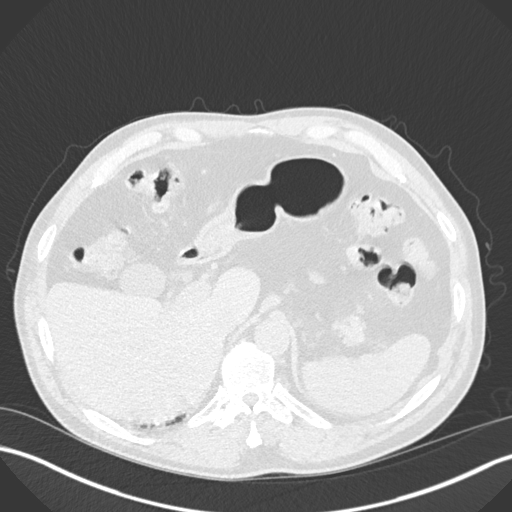
[im 41/146  lung]
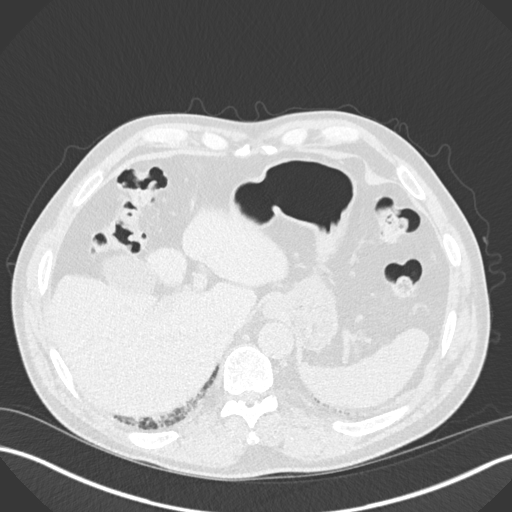
[im 57/146  mediastinal]
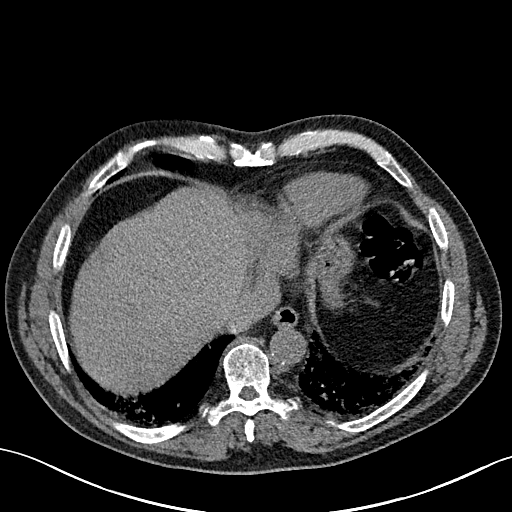
[im 57/146  lung]
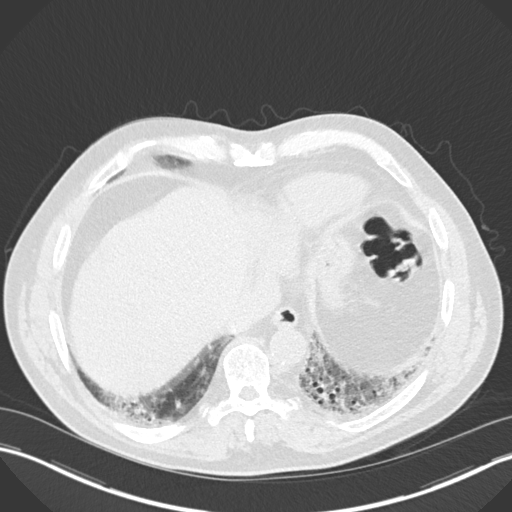
[im 65/146  lung]
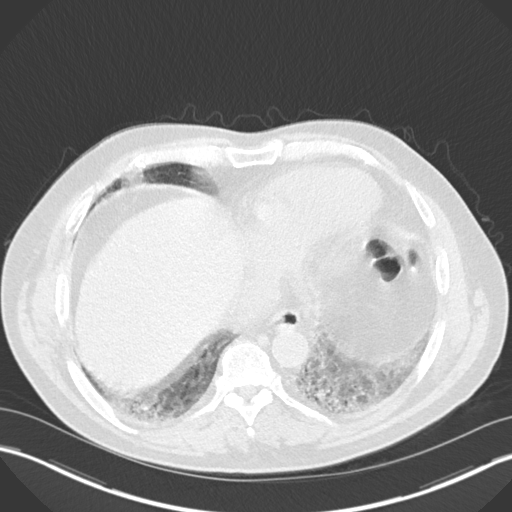
[im 81/146  lung]
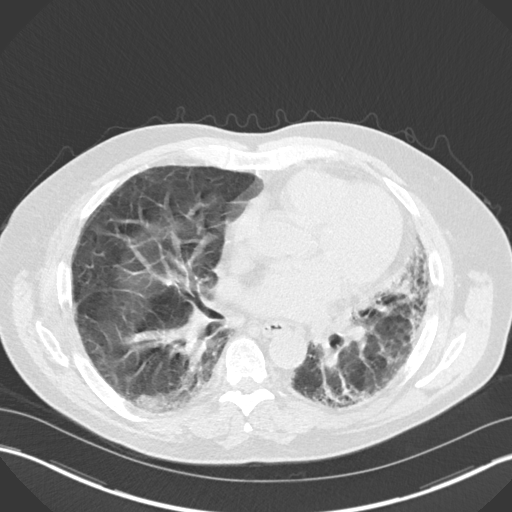
[im 89/146  lung]
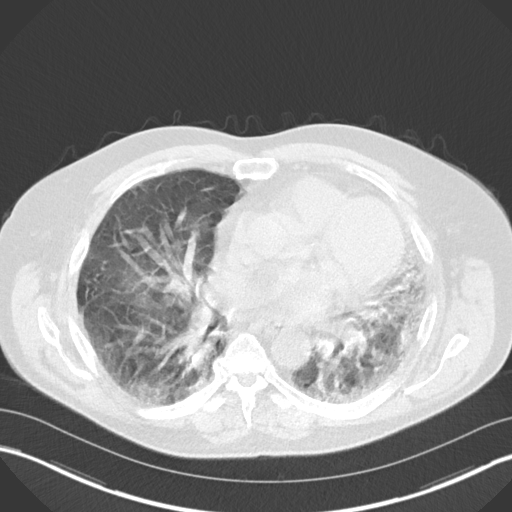
[im 105/146  mediastinal]
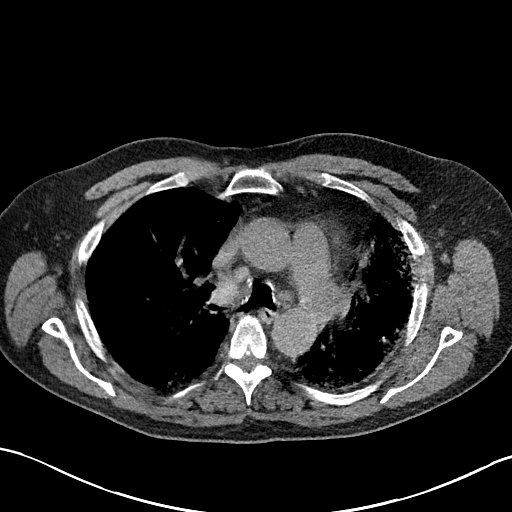
[im 105/146  lung]
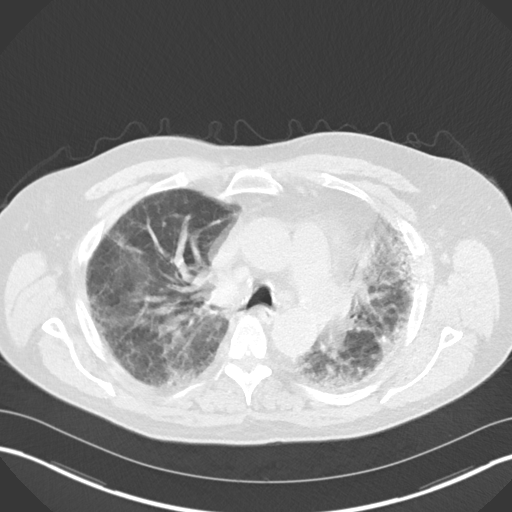
[im 113/146  lung]
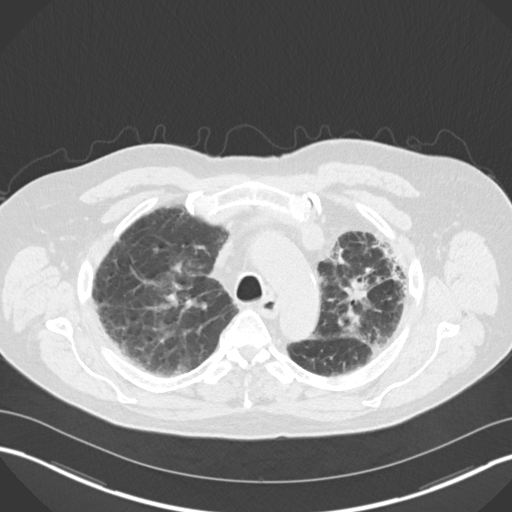
[im 121/146  lung]
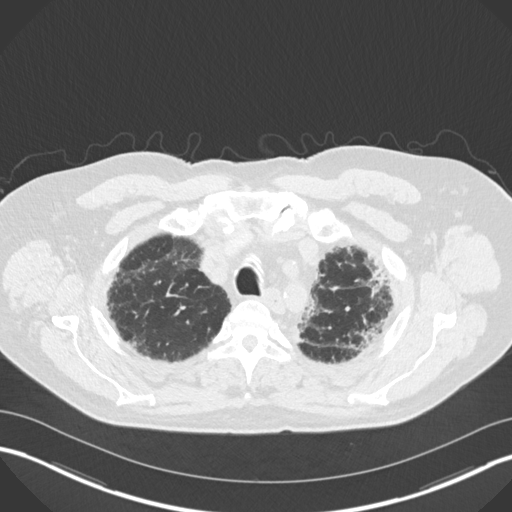
[im 137/146  lung]
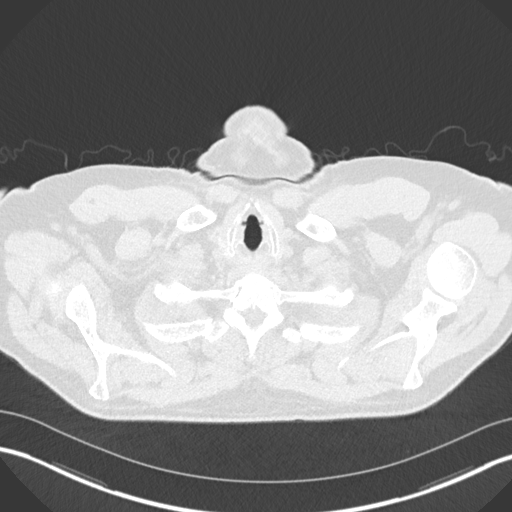

[Series 8: coronal · coronal · 0.62mm/px · 1 of 131 slices shown]
[im 66/131  lung]
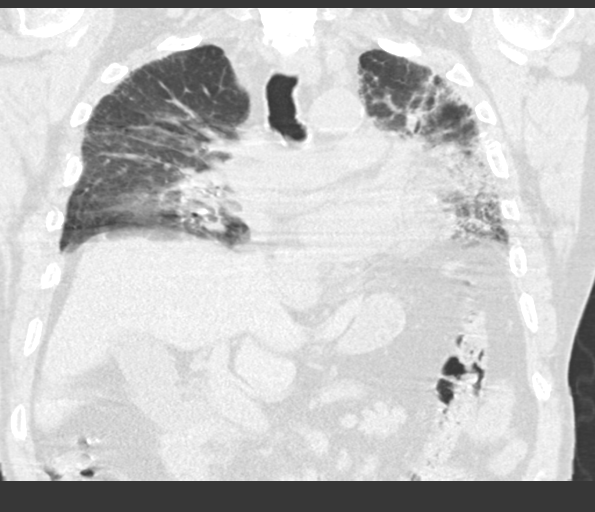

[13 of 36 positions shown; findings below may reference images not displayed]

FINDINGS: Comment:  Study is limited by extensive patient respiratory motion.

Cardiovascular: Heart size is normal. There is no significant
pericardial fluid, thickening or pericardial calcification. There is
aortic atherosclerosis, as well as atherosclerosis of the great
vessels of the mediastinum and the coronary arteries, including
calcified atherosclerotic plaque in the left main, left anterior
descending and right coronary arteries. Calcifications of the aortic
valve.

Mediastinum/Nodes: No pathologically enlarged mediastinal or hilar
lymph nodes. Please note that accurate exclusion of hilar adenopathy
is limited on noncontrast CT scans. Esophagus is unremarkable in
appearance. No axillary lymphadenopathy.

Lungs/Pleura: Study is severely limited by extensive patient
respiratory motion. With these limitations in mind there are patchy
areas of ground-glass attenuation, septal thickening, thickening of
the peribronchovascular interstitium, regional areas of
architectural distortion and traction bronchiectasis scattered
randomly throughout the lungs bilaterally (left greater than right).
In the left lung there is no definite craniocaudal gradient. No
frank honeycombing is confidently identified at this time, although
there has been clear progression of disease compared to the prior
study. No definite acute confluent consolidative airspace disease.
No pleural effusions.

Upper Abdomen: Multiple low-attenuation lesions are again noted in
the visualized portions of the liver, similar in size, number and
distribution to the prior study, but incompletely characterized on
today's noncontrast CT examination. Statistically, these are likely
to represent cysts. The largest of these lesions measures 2.9 x
cm in segment 2. Aortic atherosclerosis.

Musculoskeletal: There are no aggressive appearing lytic or blastic
lesions noted in the visualized portions of the skeleton.
IMPRESSION: 1. No findings to suggest pneumonia.
2. However, there is clear evidence of progressive interstitial lung
disease. Today's study was limited by considerable patient
respiratory motion, and was not acquired as a high-resolution chest
CT protocol. Findings are again favored to reflect chronic
hypersensitivity pneumonitis, however, underlying usual interstitial
pneumonia (UIP) is not entirely excluded. Outpatient referral to
Pulmonology for further evaluation is strongly recommended, and
repeat high-resolution chest CT in the next 6-12 months is
recommended to assess for further parenchymal changes in the lung
parenchyma.
3. Aortic atherosclerosis, in addition to left main and 2 vessel
coronary artery disease. Please note that although the presence of
coronary artery calcium documents the presence of coronary artery
disease, the severity of this disease and any potential stenosis
cannot be assessed on this non-gated CT examination. Assessment for
potential risk factor modification, dietary therapy or pharmacologic
therapy may be warranted, if clinically indicated.

Aortic Atherosclerosis (D6QI5-HKQ.Q).

## 2018-04-04 DIAGNOSIS — R6889 Other general symptoms and signs: Secondary | ICD-10-CM | POA: Diagnosis not present

## 2018-04-05 DIAGNOSIS — Z9981 Dependence on supplemental oxygen: Secondary | ICD-10-CM | POA: Diagnosis not present

## 2018-04-05 DIAGNOSIS — Z5181 Encounter for therapeutic drug level monitoring: Secondary | ICD-10-CM | POA: Diagnosis not present

## 2018-04-05 DIAGNOSIS — R6889 Other general symptoms and signs: Secondary | ICD-10-CM | POA: Diagnosis not present

## 2018-04-05 DIAGNOSIS — R918 Other nonspecific abnormal finding of lung field: Secondary | ICD-10-CM | POA: Diagnosis not present

## 2018-04-05 DIAGNOSIS — Z01818 Encounter for other preprocedural examination: Secondary | ICD-10-CM | POA: Diagnosis not present

## 2018-04-05 DIAGNOSIS — E559 Vitamin D deficiency, unspecified: Secondary | ICD-10-CM | POA: Diagnosis not present

## 2018-04-05 DIAGNOSIS — J84112 Idiopathic pulmonary fibrosis: Secondary | ICD-10-CM | POA: Diagnosis not present

## 2018-04-05 DIAGNOSIS — Z713 Dietary counseling and surveillance: Secondary | ICD-10-CM | POA: Diagnosis not present

## 2018-04-05 DIAGNOSIS — Z008 Encounter for other general examination: Secondary | ICD-10-CM | POA: Diagnosis not present

## 2018-04-05 DIAGNOSIS — Z7682 Awaiting organ transplant status: Secondary | ICD-10-CM | POA: Diagnosis not present

## 2018-04-05 DIAGNOSIS — J849 Interstitial pulmonary disease, unspecified: Secondary | ICD-10-CM | POA: Diagnosis not present

## 2018-04-05 DIAGNOSIS — Z79899 Other long term (current) drug therapy: Secondary | ICD-10-CM | POA: Diagnosis not present

## 2018-04-06 DIAGNOSIS — Z79899 Other long term (current) drug therapy: Secondary | ICD-10-CM | POA: Diagnosis not present

## 2018-04-06 DIAGNOSIS — Z01818 Encounter for other preprocedural examination: Secondary | ICD-10-CM | POA: Diagnosis not present

## 2018-04-06 DIAGNOSIS — E785 Hyperlipidemia, unspecified: Secondary | ICD-10-CM | POA: Diagnosis not present

## 2018-04-06 DIAGNOSIS — R6889 Other general symptoms and signs: Secondary | ICD-10-CM | POA: Diagnosis not present

## 2018-04-06 DIAGNOSIS — J479 Bronchiectasis, uncomplicated: Secondary | ICD-10-CM | POA: Diagnosis not present

## 2018-04-06 DIAGNOSIS — R918 Other nonspecific abnormal finding of lung field: Secondary | ICD-10-CM | POA: Diagnosis not present

## 2018-04-06 DIAGNOSIS — I10 Essential (primary) hypertension: Secondary | ICD-10-CM | POA: Diagnosis not present

## 2018-04-06 DIAGNOSIS — M791 Myalgia, unspecified site: Secondary | ICD-10-CM | POA: Diagnosis not present

## 2018-04-06 DIAGNOSIS — J849 Interstitial pulmonary disease, unspecified: Secondary | ICD-10-CM | POA: Diagnosis not present

## 2018-04-06 DIAGNOSIS — Z7682 Awaiting organ transplant status: Secondary | ICD-10-CM | POA: Diagnosis not present

## 2018-04-06 DIAGNOSIS — J84112 Idiopathic pulmonary fibrosis: Secondary | ICD-10-CM | POA: Diagnosis not present

## 2018-04-07 ENCOUNTER — Telehealth: Payer: Self-pay | Admitting: Pediatrics

## 2018-04-07 DIAGNOSIS — I2510 Atherosclerotic heart disease of native coronary artery without angina pectoris: Secondary | ICD-10-CM | POA: Diagnosis not present

## 2018-04-07 DIAGNOSIS — J84112 Idiopathic pulmonary fibrosis: Secondary | ICD-10-CM | POA: Diagnosis not present

## 2018-04-07 DIAGNOSIS — R6889 Other general symptoms and signs: Secondary | ICD-10-CM | POA: Diagnosis not present

## 2018-04-07 DIAGNOSIS — R0602 Shortness of breath: Secondary | ICD-10-CM | POA: Diagnosis not present

## 2018-04-07 DIAGNOSIS — Z01810 Encounter for preprocedural cardiovascular examination: Secondary | ICD-10-CM | POA: Diagnosis not present

## 2018-04-07 DIAGNOSIS — Z7682 Awaiting organ transplant status: Secondary | ICD-10-CM | POA: Diagnosis not present

## 2018-04-07 NOTE — Telephone Encounter (Signed)
FYI from your patient.

## 2018-04-07 NOTE — Telephone Encounter (Signed)
Thanks

## 2018-04-07 NOTE — Telephone Encounter (Signed)
FYI, appt was cancelled.

## 2018-04-08 DIAGNOSIS — J84112 Idiopathic pulmonary fibrosis: Secondary | ICD-10-CM | POA: Diagnosis not present

## 2018-04-08 DIAGNOSIS — Z7682 Awaiting organ transplant status: Secondary | ICD-10-CM | POA: Diagnosis not present

## 2018-04-08 DIAGNOSIS — R6889 Other general symptoms and signs: Secondary | ICD-10-CM | POA: Diagnosis not present

## 2018-04-09 ENCOUNTER — Ambulatory Visit: Payer: Medicare Other | Admitting: Pediatrics

## 2018-04-09 DIAGNOSIS — R05 Cough: Secondary | ICD-10-CM | POA: Diagnosis not present

## 2018-04-09 DIAGNOSIS — K224 Dyskinesia of esophagus: Secondary | ICD-10-CM | POA: Diagnosis not present

## 2018-04-09 DIAGNOSIS — Z7682 Awaiting organ transplant status: Secondary | ICD-10-CM | POA: Diagnosis not present

## 2018-04-09 DIAGNOSIS — J84112 Idiopathic pulmonary fibrosis: Secondary | ICD-10-CM | POA: Diagnosis not present

## 2018-04-09 DIAGNOSIS — K219 Gastro-esophageal reflux disease without esophagitis: Secondary | ICD-10-CM | POA: Diagnosis not present

## 2018-04-09 DIAGNOSIS — R6889 Other general symptoms and signs: Secondary | ICD-10-CM | POA: Diagnosis not present

## 2018-04-09 DIAGNOSIS — I6523 Occlusion and stenosis of bilateral carotid arteries: Secondary | ICD-10-CM | POA: Diagnosis not present

## 2018-04-10 ENCOUNTER — Other Ambulatory Visit: Payer: Self-pay | Admitting: Pediatrics

## 2018-04-12 DIAGNOSIS — J84112 Idiopathic pulmonary fibrosis: Secondary | ICD-10-CM | POA: Diagnosis not present

## 2018-04-13 DIAGNOSIS — J849 Interstitial pulmonary disease, unspecified: Secondary | ICD-10-CM | POA: Diagnosis not present

## 2018-04-14 DIAGNOSIS — J84112 Idiopathic pulmonary fibrosis: Secondary | ICD-10-CM | POA: Diagnosis not present

## 2018-04-16 DIAGNOSIS — J84112 Idiopathic pulmonary fibrosis: Secondary | ICD-10-CM | POA: Diagnosis not present

## 2018-04-19 DIAGNOSIS — J84112 Idiopathic pulmonary fibrosis: Secondary | ICD-10-CM | POA: Diagnosis not present

## 2018-04-21 DIAGNOSIS — J84112 Idiopathic pulmonary fibrosis: Secondary | ICD-10-CM | POA: Diagnosis not present

## 2018-04-23 DIAGNOSIS — J84112 Idiopathic pulmonary fibrosis: Secondary | ICD-10-CM | POA: Diagnosis not present

## 2018-04-26 DIAGNOSIS — J84112 Idiopathic pulmonary fibrosis: Secondary | ICD-10-CM | POA: Diagnosis not present

## 2018-04-27 DIAGNOSIS — H25811 Combined forms of age-related cataract, right eye: Secondary | ICD-10-CM | POA: Diagnosis not present

## 2018-04-27 DIAGNOSIS — H25812 Combined forms of age-related cataract, left eye: Secondary | ICD-10-CM | POA: Diagnosis not present

## 2018-04-28 DIAGNOSIS — J84112 Idiopathic pulmonary fibrosis: Secondary | ICD-10-CM | POA: Diagnosis not present

## 2018-04-30 DIAGNOSIS — H25812 Combined forms of age-related cataract, left eye: Secondary | ICD-10-CM | POA: Diagnosis not present

## 2018-04-30 DIAGNOSIS — H25811 Combined forms of age-related cataract, right eye: Secondary | ICD-10-CM | POA: Diagnosis not present

## 2018-04-30 DIAGNOSIS — J84112 Idiopathic pulmonary fibrosis: Secondary | ICD-10-CM | POA: Diagnosis not present

## 2018-05-03 DIAGNOSIS — H25812 Combined forms of age-related cataract, left eye: Secondary | ICD-10-CM | POA: Diagnosis not present

## 2018-05-03 DIAGNOSIS — E785 Hyperlipidemia, unspecified: Secondary | ICD-10-CM | POA: Diagnosis not present

## 2018-05-03 DIAGNOSIS — Z7982 Long term (current) use of aspirin: Secondary | ICD-10-CM | POA: Diagnosis not present

## 2018-05-03 DIAGNOSIS — H25813 Combined forms of age-related cataract, bilateral: Secondary | ICD-10-CM | POA: Diagnosis not present

## 2018-05-03 DIAGNOSIS — I10 Essential (primary) hypertension: Secondary | ICD-10-CM | POA: Diagnosis not present

## 2018-05-03 DIAGNOSIS — J8410 Pulmonary fibrosis, unspecified: Secondary | ICD-10-CM

## 2018-05-03 DIAGNOSIS — Z79899 Other long term (current) drug therapy: Secondary | ICD-10-CM | POA: Diagnosis not present

## 2018-05-03 DIAGNOSIS — K219 Gastro-esophageal reflux disease without esophagitis: Secondary | ICD-10-CM | POA: Diagnosis not present

## 2018-05-03 DIAGNOSIS — J849 Interstitial pulmonary disease, unspecified: Secondary | ICD-10-CM | POA: Diagnosis not present

## 2018-05-03 DIAGNOSIS — M1710 Unilateral primary osteoarthritis, unspecified knee: Secondary | ICD-10-CM | POA: Diagnosis not present

## 2018-05-03 DIAGNOSIS — Z9981 Dependence on supplemental oxygen: Secondary | ICD-10-CM | POA: Diagnosis not present

## 2018-05-03 DIAGNOSIS — J9611 Chronic respiratory failure with hypoxia: Secondary | ICD-10-CM | POA: Diagnosis not present

## 2018-05-03 DIAGNOSIS — H2512 Age-related nuclear cataract, left eye: Secondary | ICD-10-CM | POA: Diagnosis not present

## 2018-05-03 HISTORY — DX: Pulmonary fibrosis, unspecified: J84.10

## 2018-05-06 ENCOUNTER — Ambulatory Visit (INDEPENDENT_AMBULATORY_CARE_PROVIDER_SITE_OTHER): Payer: Medicare Other | Admitting: Pediatrics

## 2018-05-06 ENCOUNTER — Encounter: Payer: Self-pay | Admitting: Pediatrics

## 2018-05-06 VITALS — BP 100/68 | HR 79 | Temp 98.4°F | Resp 18 | Ht 68.5 in | Wt 174.2 lb

## 2018-05-06 DIAGNOSIS — J849 Interstitial pulmonary disease, unspecified: Secondary | ICD-10-CM | POA: Diagnosis not present

## 2018-05-06 DIAGNOSIS — I10 Essential (primary) hypertension: Secondary | ICD-10-CM

## 2018-05-06 DIAGNOSIS — E785 Hyperlipidemia, unspecified: Secondary | ICD-10-CM

## 2018-05-06 DIAGNOSIS — M25531 Pain in right wrist: Secondary | ICD-10-CM

## 2018-05-06 DIAGNOSIS — Z23 Encounter for immunization: Secondary | ICD-10-CM

## 2018-05-06 DIAGNOSIS — Z1211 Encounter for screening for malignant neoplasm of colon: Secondary | ICD-10-CM | POA: Diagnosis not present

## 2018-05-06 DIAGNOSIS — Z7682 Awaiting organ transplant status: Secondary | ICD-10-CM

## 2018-05-06 MED ORDER — AMLODIPINE BESYLATE 5 MG PO TABS: 5.0000 mg | ORAL_TABLET | Freq: Every day | ORAL | 3 refills | Status: DC

## 2018-05-06 MED ORDER — CHLORTHALIDONE 25 MG PO TABS: 25.0000 mg | ORAL_TABLET | Freq: Every day | ORAL | 1 refills | Status: DC

## 2018-05-06 MED ORDER — PRAVASTATIN SODIUM 40 MG PO TABS: 40.0000 mg | ORAL_TABLET | Freq: Every day | ORAL | 1 refills | Status: DC

## 2018-05-06 MED ORDER — IRBESARTAN 150 MG PO TABS: 150.0000 mg | ORAL_TABLET | Freq: Every day | ORAL | 1 refills | Status: DC

## 2018-05-06 NOTE — Progress Notes (Signed)
Subjective:   Patient ID: Vernon Richard, male    DOB: 03/17/1951, 67 y.o.   MRN: [id number] CC: Medical Management of Chronic Issues  HPI: Vernon Richard is a 67 y.o. male   Getting worked up for lung transplant.  History of idiopathic pulm fibrosis.  Follows with [facility].  Recently started on pirfenidone.  Needs blood work monthly for the next month faxed to his doctor.  Needs to get up-to-date on routine screening and preventive care.  Due for colonoscopy follow up.  Has paperwork this visit with DEXA scan.  Has upcoming PET scan to evaluate mesenteric lymph nodes seen on CTA for surgery planning.    Symptoms lightheaded when he stands up.  This is been happening more often recently.  Taking blood pressure medicines regularly.  No recent fevers.  Appetite has been ok. Quite SOB with exertion.  He is wearing oxygen regularly.  Recent eye surgery, he feels like he is recovering well.  Right wrist bothering him with certain activities.  Relevant past medical, surgical, family and social history reviewed. Allergies and medications reviewed and updated. Social History   Tobacco Use  Smoking Status Never Smoker  Smokeless Tobacco Never Used   ROS: Per HPI   Objective:    BP 100/68   Pulse 79   Temp 98.4 F (36.9 C) (Oral)   Resp 18   Ht 5' 8.5" (1.74 m)   Wt 174 lb 3.2 oz (79 kg)   SpO2 97% Comment: 2L o2  BMI 26.10 kg/m   Wt Readings from Last 3 Encounters:  05/06/18 174 lb 3.2 oz (79 kg)  12/10/17 189 lb 9.6 oz (86 kg)  11/16/17 185 lb 12.8 oz (84.3 kg)   Gen: NAD, alert, cooperative with exam, NCAT EYES: EOMI, no conjunctival injection, or no icterus ENT:  TMs pearly gray b/l, OP without erythema LYMPH: no cervical LAD CV: NRRR, normal S1/S2, no murmur, distal pulses 2+ b/l Resp: Moving air fair, crackles bilateral bases, no wheezes, comfortable WOB Abd: +BS, soft, NTND. no guarding or organomegaly Ext: No edema, warm Neuro: Alert and oriented MSK: Tender along lateral  right wrist.  Positive Finkelstein's.  Assessment & Plan:  Vernon Richard was seen today for medical management of chronic issues.  Diagnoses and all orders for this visit:  ILD (interstitial lung disease) ([place]) Following with [facility].  Getting work-up for lung transplant.  Essential hypertension Low today.  Regularly lightheaded at home.  Will decrease amlodipine to 5 mg.  Continue other medicines. -     irbesartan (AVAPRO) 150 MG tablet; Take 1 tablet (150 mg total) by mouth daily. -     chlorthalidone (HYGROTON) 25 MG tablet; Take 1 tablet (25 mg total) by mouth daily. -     amLODipine (NORVASC) 5 MG tablet; Take 1 tablet (5 mg total) by mouth daily. -     CMP14+EGFR  Right wrist pain Started after recent initiation of pulmonary rehab.  He has been bothering him some.  Suspect related to recent increase in activity pulmonary rehab.  Encouraged him to talk with his physical therapist, may need modification for some of the exercises.  Screening for colon cancer Due -     Ambulatory referral to Gastroenterology  Lung transplant candidate -     Hepatitis B vaccine adult IM -     DG [facility] DEXA  Need for vaccination with PVAX (pneumococcal vaccination) -     Pneumococcal polysaccharide vaccine 23-valent greater than or equal to 68yo subcutaneous/IM  Hyperlipidemia, unspecified hyperlipidemia type Stable, continue -     pravastatin (PRAVACHOL) 40 MG tablet; Take 1 tablet (40 mg total) by mouth daily.  I spent 25 minutes with the patient with over 50% of the encounter time dedicated to counseling on the above problems.  Follow up plan: 3 months, sooner if needed.  Will be returning monthly to get blood work to his doctor at [facility]. Assunta Found, MD [place]

## 2018-05-07 LAB — CMP14+EGFR
ALBUMIN: 4.2 g/dL (ref 3.6–4.8)
ALT: 13 IU/L (ref 0–44)
AST: 13 IU/L (ref 0–40)
Albumin/Globulin Ratio: 1.8 (ref 1.2–2.2)
Alkaline Phosphatase: 66 IU/L (ref 39–117)
BUN / CREAT RATIO: 13 (ref 10–24)
BUN: 12 mg/dL (ref 8–27)
Bilirubin Total: 0.4 mg/dL (ref 0.0–1.2)
CALCIUM: 9.7 mg/dL (ref 8.6–10.2)
CO2: 26 mmol/L (ref 20–29)
CREATININE: 0.9 mg/dL (ref 0.76–1.27)
Chloride: 96 mmol/L (ref 96–106)
GFR, EST AFRICAN AMERICAN: 101 mL/min/{1.73_m2} (ref >59)
GFR, EST NON AFRICAN AMERICAN: 87 mL/min/{1.73_m2} (ref >59)
GLOBULIN, TOTAL: 2.3 g/dL (ref 1.5–4.5)
Glucose: 96 mg/dL (ref 65–99)
Potassium: 4.8 mmol/L (ref 3.5–5.2)
SODIUM: 137 mmol/L (ref 134–144)
TOTAL PROTEIN: 6.5 g/dL (ref 6.0–8.5)

## 2018-05-10 DIAGNOSIS — J84112 Idiopathic pulmonary fibrosis: Secondary | ICD-10-CM | POA: Diagnosis not present

## 2018-05-12 DIAGNOSIS — J84112 Idiopathic pulmonary fibrosis: Secondary | ICD-10-CM | POA: Diagnosis not present

## 2018-05-13 DIAGNOSIS — Z86010 Personal history of colonic polyps: Secondary | ICD-10-CM | POA: Diagnosis not present

## 2018-05-13 DIAGNOSIS — J849 Interstitial pulmonary disease, unspecified: Secondary | ICD-10-CM | POA: Diagnosis not present

## 2018-05-13 DIAGNOSIS — R1084 Generalized abdominal pain: Secondary | ICD-10-CM | POA: Diagnosis not present

## 2018-05-14 DIAGNOSIS — J84112 Idiopathic pulmonary fibrosis: Secondary | ICD-10-CM | POA: Diagnosis not present

## 2018-05-17 DIAGNOSIS — Z79899 Other long term (current) drug therapy: Secondary | ICD-10-CM | POA: Diagnosis not present

## 2018-05-17 DIAGNOSIS — K219 Gastro-esophageal reflux disease without esophagitis: Secondary | ICD-10-CM | POA: Diagnosis not present

## 2018-05-17 DIAGNOSIS — H2511 Age-related nuclear cataract, right eye: Secondary | ICD-10-CM | POA: Diagnosis not present

## 2018-05-17 DIAGNOSIS — I10 Essential (primary) hypertension: Secondary | ICD-10-CM | POA: Diagnosis not present

## 2018-05-17 DIAGNOSIS — E785 Hyperlipidemia, unspecified: Secondary | ICD-10-CM | POA: Diagnosis not present

## 2018-05-17 DIAGNOSIS — I2510 Atherosclerotic heart disease of native coronary artery without angina pectoris: Secondary | ICD-10-CM | POA: Diagnosis not present

## 2018-05-17 DIAGNOSIS — H0014 Chalazion left upper eyelid: Secondary | ICD-10-CM | POA: Insufficient documentation

## 2018-05-17 DIAGNOSIS — M1710 Unilateral primary osteoarthritis, unspecified knee: Secondary | ICD-10-CM | POA: Diagnosis not present

## 2018-05-17 DIAGNOSIS — H25811 Combined forms of age-related cataract, right eye: Secondary | ICD-10-CM | POA: Diagnosis not present

## 2018-05-19 DIAGNOSIS — L718 Other rosacea: Secondary | ICD-10-CM | POA: Diagnosis not present

## 2018-05-19 DIAGNOSIS — H00024 Hordeolum internum left upper eyelid: Secondary | ICD-10-CM | POA: Diagnosis not present

## 2018-05-20 DIAGNOSIS — Z7982 Long term (current) use of aspirin: Secondary | ICD-10-CM | POA: Diagnosis not present

## 2018-05-20 DIAGNOSIS — K219 Gastro-esophageal reflux disease without esophagitis: Secondary | ICD-10-CM | POA: Diagnosis not present

## 2018-05-20 DIAGNOSIS — E785 Hyperlipidemia, unspecified: Secondary | ICD-10-CM | POA: Diagnosis not present

## 2018-05-20 DIAGNOSIS — Z942 Lung transplant status: Secondary | ICD-10-CM | POA: Diagnosis not present

## 2018-05-20 DIAGNOSIS — R011 Cardiac murmur, unspecified: Secondary | ICD-10-CM | POA: Diagnosis not present

## 2018-05-20 DIAGNOSIS — Z79899 Other long term (current) drug therapy: Secondary | ICD-10-CM | POA: Diagnosis not present

## 2018-05-20 DIAGNOSIS — Z1211 Encounter for screening for malignant neoplasm of colon: Secondary | ICD-10-CM | POA: Diagnosis not present

## 2018-05-20 DIAGNOSIS — Z833 Family history of diabetes mellitus: Secondary | ICD-10-CM | POA: Diagnosis not present

## 2018-05-20 DIAGNOSIS — J84112 Idiopathic pulmonary fibrosis: Secondary | ICD-10-CM | POA: Diagnosis not present

## 2018-05-20 DIAGNOSIS — I10 Essential (primary) hypertension: Secondary | ICD-10-CM | POA: Diagnosis not present

## 2018-05-20 DIAGNOSIS — Z800 Family history of malignant neoplasm of digestive organs: Secondary | ICD-10-CM | POA: Diagnosis not present

## 2018-05-20 DIAGNOSIS — J849 Interstitial pulmonary disease, unspecified: Secondary | ICD-10-CM | POA: Diagnosis not present

## 2018-05-20 DIAGNOSIS — I2510 Atherosclerotic heart disease of native coronary artery without angina pectoris: Secondary | ICD-10-CM | POA: Diagnosis not present

## 2018-05-20 DIAGNOSIS — Z9981 Dependence on supplemental oxygen: Secondary | ICD-10-CM | POA: Diagnosis not present

## 2018-05-20 DIAGNOSIS — Z8719 Personal history of other diseases of the digestive system: Secondary | ICD-10-CM | POA: Diagnosis not present

## 2018-05-20 DIAGNOSIS — Z86010 Personal history of colonic polyps: Secondary | ICD-10-CM | POA: Diagnosis not present

## 2018-05-20 DIAGNOSIS — M1990 Unspecified osteoarthritis, unspecified site: Secondary | ICD-10-CM | POA: Diagnosis not present

## 2018-05-20 DIAGNOSIS — Z825 Family history of asthma and other chronic lower respiratory diseases: Secondary | ICD-10-CM | POA: Diagnosis not present

## 2018-05-20 DIAGNOSIS — Z801 Family history of malignant neoplasm of trachea, bronchus and lung: Secondary | ICD-10-CM | POA: Diagnosis not present

## 2018-05-24 ENCOUNTER — Ambulatory Visit (INDEPENDENT_AMBULATORY_CARE_PROVIDER_SITE_OTHER): Payer: Medicare Other

## 2018-05-24 DIAGNOSIS — Z7682 Awaiting organ transplant status: Secondary | ICD-10-CM

## 2018-05-24 DIAGNOSIS — J84112 Idiopathic pulmonary fibrosis: Secondary | ICD-10-CM | POA: Diagnosis not present

## 2018-05-24 DIAGNOSIS — R6889 Other general symptoms and signs: Secondary | ICD-10-CM | POA: Diagnosis not present

## 2018-05-24 DIAGNOSIS — Z13820 Encounter for screening for osteoporosis: Secondary | ICD-10-CM | POA: Diagnosis not present

## 2018-05-25 DIAGNOSIS — I6529 Occlusion and stenosis of unspecified carotid artery: Secondary | ICD-10-CM | POA: Diagnosis not present

## 2018-05-25 DIAGNOSIS — J8410 Pulmonary fibrosis, unspecified: Secondary | ICD-10-CM | POA: Diagnosis not present

## 2018-05-25 DIAGNOSIS — J984 Other disorders of lung: Secondary | ICD-10-CM | POA: Diagnosis not present

## 2018-05-25 DIAGNOSIS — J84112 Idiopathic pulmonary fibrosis: Secondary | ICD-10-CM | POA: Diagnosis not present

## 2018-05-25 DIAGNOSIS — R935 Abnormal findings on diagnostic imaging of other abdominal regions, including retroperitoneum: Secondary | ICD-10-CM | POA: Diagnosis not present

## 2018-05-25 DIAGNOSIS — R6889 Other general symptoms and signs: Secondary | ICD-10-CM | POA: Diagnosis not present

## 2018-05-25 DIAGNOSIS — J849 Interstitial pulmonary disease, unspecified: Secondary | ICD-10-CM | POA: Diagnosis not present

## 2018-05-25 DIAGNOSIS — R590 Localized enlarged lymph nodes: Secondary | ICD-10-CM | POA: Diagnosis not present

## 2018-05-25 DIAGNOSIS — Z7682 Awaiting organ transplant status: Secondary | ICD-10-CM | POA: Diagnosis not present

## 2018-05-26 DIAGNOSIS — J84112 Idiopathic pulmonary fibrosis: Secondary | ICD-10-CM | POA: Diagnosis not present

## 2018-05-27 ENCOUNTER — Ambulatory Visit (INDEPENDENT_AMBULATORY_CARE_PROVIDER_SITE_OTHER): Payer: Medicare Other

## 2018-05-27 VITALS — BP 115/66 | HR 90 | Temp 98.6°F | Ht 68.0 in | Wt 178.0 lb

## 2018-05-27 DIAGNOSIS — Z0000 Encounter for general adult medical examination without abnormal findings: Secondary | ICD-10-CM | POA: Diagnosis not present

## 2018-05-27 NOTE — Progress Notes (Signed)
Subjective:   Vernon Richard is a 67 y.o. male who presents for an Initial Medicare Annual Wellness Visit. He currently resides in [place] with his wife and his son Glennon Mac.  His son lives in their basement. He has recently been diagnosed with pulmonary fibrosis and is awaiting a lung transplant. He is wearing O2 today at his visit and his sats are 91%. Up until last month he was working full time for [employer] until he became sick and diagnosed with pulmonary fibrosis. He enjoys farming and raising cows. He is unable to do any exercise due to his breathing difficulties. They have a basement and an upstairs in their home. Patient does go up and down steps but not very often. Wife accompanies patient to the appt today and she states her concerns about insurance not approving a needed PET scan for her husband. They are anxiously awaiting this to be done so they can move forward with his transplant. He has never been a smoker. He is currently up to date on all health maintenance.   Review of Systems   Cardiac risk factors: Advanced age (>73men,>65 women); male gender       Objective:    Today's Vitals   05/27/18 1121  BP: 115/66  Pulse: 90  Temp: 98.6 F (37 C)  TempSrc: Oral  SpO2: 91%  Weight: 178 lb (80.7 kg)  Height: 5\' 8"  (1.727 m)   Body mass index is 27.06 kg/m.  Advanced Directives 05/27/2018  Does Patient Have a Medical Advance Directive? Yes  Type of Paramedic of [place];Living will  Copy of [place] in Chart? No - copy requested    Current Medications (verified) Outpatient Encounter Medications as of 05/27/2018  Medication Sig  . amLODipine (NORVASC) 5 MG tablet Take 1 tablet (5 mg total) by mouth daily.  Marland Kitchen aspirin 81 MG tablet Take 81 mg by mouth daily.  . cetirizine (ZYRTEC) 10 MG tablet Take 10 mg by mouth daily.  . chlorthalidone (HYGROTON) 25 MG tablet Take 1 tablet (25 mg total) by mouth daily.  . irbesartan (AVAPRO)  150 MG tablet Take 1 tablet (150 mg total) by mouth daily.  . Pirfenidone (ESBRIET) 267 MG TABS Take by mouth.  . pravastatin (PRAVACHOL) 40 MG tablet Take 1 tablet (40 mg total) by mouth daily.  . prednisoLONE acetate (PRED FORTE) 1 % ophthalmic suspension Apply to eye.   No facility-administered encounter medications on file as of 05/27/2018.     Allergies (verified) Patient has no known allergies.   History: Past Medical History:  Diagnosis Date  . Arthritis    OA of both knees  . Heart murmur, systolic   . Hyperlipidemia   . Hypertension   . Pulmonary fibrosis ([place]) 05/03/2018   Past Surgical History:  Procedure Laterality Date  . CATARACT EXTRACTION, BILATERAL    . HERNIA REPAIR     groin   Family History  Problem Relation Age of Onset  . Cancer Mother   . Cancer Father    Social History   Socioeconomic History  . Marital status: Married    Spouse name: Not on file  . Number of children: Not on file  . Years of education: Not on file  . Highest education level: 12th grade  Occupational History  . Occupation: Immunologist  . Financial resource strain: Not hard at all  . Food insecurity:    Worry: Never true    Inability: Never  true  . Transportation needs:    Medical: No    Non-medical: No  Tobacco Use  . Smoking status: Never Smoker  . Smokeless tobacco: Never Used  Substance and Sexual Activity  . Alcohol use: No  . Drug use: No  . Sexual activity: Not Currently  Lifestyle  . Physical activity:    Days per week: 0 days    Minutes per session: 0 min  . Stress: Not at all  Relationships  . Social connections:    Talks on phone: More than three times a week    Gets together: More than three times a week    Attends religious service: Not on file    Active member of club or organization: Not on file    Attends meetings of clubs or organizations: Not on file    Relationship status: Married  Other Topics Concern  . Not on file  Social History  Narrative   Married    1 child   Tobacco Counseling Patient has never been a smoker  Clinical Intake:  Pre-visit preparation completed: No  Pain : No/denies pain     BMI - recorded: 27.84 Nutritional Status: BMI 25 -29 Overweight Nutritional Risks: None Diabetes: No  How often do you need to have someone help you when you read instructions, pamphlets, or other written materials from your doctor or pharmacy?: 1 - Never What is the last grade level you completed in school?: 12th grade  Interpreter Needed?: No  Information entered by :: Theodoro Clock LPN  Activities of Daily Living In your present state of health, do you have any difficulty performing the following activities: 05/27/2018  Hearing? N  Vision? Y  Comment Needs some new glasses  Difficulty concentrating or making decisions? N  Walking or climbing stairs? Y  Comment Due to breathing  Dressing or bathing? N  Doing errands, shopping? N  Preparing Food and eating ? N  Using the Toilet? N  In the past six months, have you accidently leaked urine? N  Do you have problems with loss of bowel control? N  Managing your Medications? N  Managing your Finances? N  Housekeeping or managing your Housekeeping? N  Some recent data might be hidden     Immunizations and Health Maintenance Immunization History  Administered Date(s) Administered  . Hepatitis B, adult 05/06/2018  . Influenza, High Dose Seasonal PF 09/21/2017  . Influenza, Seasonal, Injecte, Preservative Fre 07/19/2013, 09/13/2014, 09/18/2015, 09/17/2016  . Influenza,inj,Quad PF,6+ Mos 07/19/2013, 09/13/2014, 09/18/2015, 09/17/2016  . Pneumococcal Conjugate-13 03/17/2017  . Pneumococcal Polysaccharide-23 05/06/2018  . Tdap 07/26/2012   Patient will think about new Shingrix vaccine and let us know  Health Maintenance Due  Topic Date Due  . INFLUENZA VACCINE  05/20/2018    Patient Care Team: Eustaquio Maize, MD as PCP - General  (Pediatrics)  Indicate any recent Medical Services you may have received from other than [facility] providers in the past year (date may be approximate).    Assessment:   This is a routine wellness examination for [place].  Hearing/Vision screen No exam data present  Dietary issues and exercise activities discussed: Current Exercise Habits: The patient does not participate in regular exercise at present, Exercise limited by: respiratory conditions(s)  Patient is unable to due to breathing difficulties  Goals    . DIET - EAT MORE FRUITS AND VEGETABLES    . Have 3 meals a day      Depression Screen PHQ 2/9 Scores 05/27/2018 05/27/2018  05/06/2018 12/10/2017  PHQ - 2 Score 0 0 0 0    Fall Risk Fall Risk  05/27/2018 05/27/2018 05/06/2018 12/10/2017 11/04/2017  Falls in the past year? No No No No No    Is the patient's home free of loose throw rugs in walkways, pet beds, electrical cords, etc?   yes      Grab bars in the bathroom? no      Handrails on the stairs?   yes      Adequate lighting?   yes    Cognitive Function: MMSE - Mini Mental State Exam 05/27/2018  Orientation to time 5  Orientation to Place 5  Registration 3  Attention/ Calculation 5  Recall 3  Language- name 2 objects 2  Language- repeat 1  Language- follow 3 step command 3  Language- read & follow direction 1  Write a sentence 1  Copy design 1  Total score 30    Patient has no memory problems    Screening Tests Health Maintenance  Topic Date Due  . INFLUENZA VACCINE  05/20/2018  . COLONOSCOPY  10/03/2018  . TETANUS/TDAP  07/26/2022  . Hepatitis C Screening  Completed  . PNA vac Low Risk Adult  Completed    Qualifies for Shingles Vaccine? Patient will consider vaccine and let us know  Cancer Screenings: Lung: Low Dose CT Chest recommended if Age 56-80 years, 30 pack-year currently smoking OR have quit w/in 15years. Patient does not qualify. Colorectal: Patient next due 10/03/2018  Additional Screenings:   Hepatitis C Screening:     Completed on 03/17/18      Plan:     I have personally reviewed and noted the following in the patient's chart:   . Medical and social history . Use of alcohol, tobacco or illicit drugs  . Current medications and supplements . Functional ability and status . Nutritional status . Physical activity . Advanced directives . List of other physicians . Hospitalizations, surgeries, and ER visits in previous 12 months . Vitals . Screenings to include cognitive, depression, and falls . Referrals and appointments  In addition, I have reviewed and discussed with patient certain preventive protocols, quality metrics, and best practice recommendations. A written personalized care plan for preventive services as well as general preventive health recommendations were provided to patient.     Rolena Infante, LPN   01/18/5829

## 2018-05-27 NOTE — Patient Instructions (Signed)
  Vernon Richard , Thank you for taking time to come for your Medicare Wellness Visit. I appreciate your ongoing commitment to your health goals. Please review the following plan we discussed and let me know if I can assist you in the future.   These are the goals we discussed: Goals    . DIET - EAT MORE FRUITS AND VEGETABLES    . Have 3 meals a day       This is a list of the screening recommended for you and due dates:  Health Maintenance  Topic Date Due  . Flu Shot  05/20/2018  . Colon Cancer Screening  10/03/2018  . Tetanus Vaccine  07/26/2022  .  Hepatitis C: One time screening is recommended by Center for Disease Control  (CDC) for  adults born from [birth year] through 1966.   Completed  . Pneumonia vaccines  Completed

## 2018-05-28 DIAGNOSIS — J84112 Idiopathic pulmonary fibrosis: Secondary | ICD-10-CM | POA: Diagnosis not present

## 2018-05-31 ENCOUNTER — Telehealth: Payer: Self-pay | Admitting: *Deleted

## 2018-05-31 DIAGNOSIS — J84112 Idiopathic pulmonary fibrosis: Secondary | ICD-10-CM | POA: Diagnosis not present

## 2018-06-01 NOTE — Telephone Encounter (Signed)
Called and spoke with patient.  PET scan had not been ordered at this time.

## 2018-06-02 DIAGNOSIS — J84112 Idiopathic pulmonary fibrosis: Secondary | ICD-10-CM | POA: Diagnosis not present

## 2018-06-03 ENCOUNTER — Ambulatory Visit (INDEPENDENT_AMBULATORY_CARE_PROVIDER_SITE_OTHER): Payer: Medicare Other | Admitting: *Deleted

## 2018-06-03 DIAGNOSIS — Z23 Encounter for immunization: Secondary | ICD-10-CM | POA: Diagnosis not present

## 2018-06-03 NOTE — Progress Notes (Signed)
Pt given 2nd Hep B vaccine Tolerated well

## 2018-06-04 DIAGNOSIS — J84112 Idiopathic pulmonary fibrosis: Secondary | ICD-10-CM | POA: Diagnosis not present

## 2018-06-07 ENCOUNTER — Other Ambulatory Visit: Payer: Self-pay | Admitting: Pediatrics

## 2018-06-07 DIAGNOSIS — J84112 Idiopathic pulmonary fibrosis: Secondary | ICD-10-CM | POA: Diagnosis not present

## 2018-06-07 DIAGNOSIS — I10 Essential (primary) hypertension: Secondary | ICD-10-CM

## 2018-06-07 MED ORDER — LOSARTAN POTASSIUM 50 MG PO TABS: 50.0000 mg | ORAL_TABLET | Freq: Every day | ORAL | 1 refills | Status: DC

## 2018-06-07 NOTE — Progress Notes (Signed)
Fax received from pharmacy, irbesartan on back order, needs to be changed to different med.  Sent in losartan 50 mg to replace.  Keep an eye on blood pressures.  Let me know if persistently elevated.  Can you call patient to confirm change?  He should not take both irbesartan and losartan.

## 2018-06-07 NOTE — Progress Notes (Signed)
Patient aware and verbalizes understanding. 

## 2018-06-09 DIAGNOSIS — J84112 Idiopathic pulmonary fibrosis: Secondary | ICD-10-CM | POA: Diagnosis not present

## 2018-06-11 DIAGNOSIS — J84112 Idiopathic pulmonary fibrosis: Secondary | ICD-10-CM | POA: Diagnosis not present

## 2018-06-13 DIAGNOSIS — J849 Interstitial pulmonary disease, unspecified: Secondary | ICD-10-CM | POA: Diagnosis not present

## 2018-06-14 DIAGNOSIS — J84112 Idiopathic pulmonary fibrosis: Secondary | ICD-10-CM | POA: Diagnosis not present

## 2018-06-16 DIAGNOSIS — J84112 Idiopathic pulmonary fibrosis: Secondary | ICD-10-CM | POA: Diagnosis not present

## 2018-06-18 DIAGNOSIS — J84112 Idiopathic pulmonary fibrosis: Secondary | ICD-10-CM | POA: Diagnosis not present

## 2018-06-22 ENCOUNTER — Other Ambulatory Visit: Payer: Medicare Other

## 2018-06-22 DIAGNOSIS — J849 Interstitial pulmonary disease, unspecified: Secondary | ICD-10-CM | POA: Diagnosis not present

## 2018-06-23 DIAGNOSIS — J84112 Idiopathic pulmonary fibrosis: Secondary | ICD-10-CM | POA: Diagnosis not present

## 2018-06-25 DIAGNOSIS — J9611 Chronic respiratory failure with hypoxia: Secondary | ICD-10-CM | POA: Diagnosis not present

## 2018-06-25 DIAGNOSIS — J849 Interstitial pulmonary disease, unspecified: Secondary | ICD-10-CM | POA: Diagnosis not present

## 2018-06-25 DIAGNOSIS — J84112 Idiopathic pulmonary fibrosis: Secondary | ICD-10-CM | POA: Diagnosis not present

## 2018-06-25 DIAGNOSIS — R05 Cough: Secondary | ICD-10-CM | POA: Diagnosis not present

## 2018-06-28 DIAGNOSIS — J84112 Idiopathic pulmonary fibrosis: Secondary | ICD-10-CM | POA: Diagnosis not present

## 2018-06-30 DIAGNOSIS — J84112 Idiopathic pulmonary fibrosis: Secondary | ICD-10-CM | POA: Diagnosis not present

## 2018-07-02 DIAGNOSIS — J84112 Idiopathic pulmonary fibrosis: Secondary | ICD-10-CM | POA: Diagnosis not present

## 2018-07-05 ENCOUNTER — Other Ambulatory Visit: Payer: Self-pay | Admitting: Pediatrics

## 2018-07-05 DIAGNOSIS — J84112 Idiopathic pulmonary fibrosis: Secondary | ICD-10-CM | POA: Diagnosis not present

## 2018-07-07 DIAGNOSIS — R6889 Other general symptoms and signs: Secondary | ICD-10-CM | POA: Diagnosis not present

## 2018-07-07 DIAGNOSIS — J84112 Idiopathic pulmonary fibrosis: Secondary | ICD-10-CM | POA: Diagnosis not present

## 2018-07-08 DIAGNOSIS — Z01818 Encounter for other preprocedural examination: Secondary | ICD-10-CM | POA: Diagnosis not present

## 2018-07-08 DIAGNOSIS — J849 Interstitial pulmonary disease, unspecified: Secondary | ICD-10-CM | POA: Diagnosis not present

## 2018-07-08 DIAGNOSIS — I6529 Occlusion and stenosis of unspecified carotid artery: Secondary | ICD-10-CM | POA: Diagnosis not present

## 2018-07-08 DIAGNOSIS — J984 Other disorders of lung: Secondary | ICD-10-CM | POA: Diagnosis not present

## 2018-07-08 DIAGNOSIS — Z7682 Awaiting organ transplant status: Secondary | ICD-10-CM | POA: Diagnosis not present

## 2018-07-08 DIAGNOSIS — Z9981 Dependence on supplemental oxygen: Secondary | ICD-10-CM | POA: Diagnosis not present

## 2018-07-08 DIAGNOSIS — R6889 Other general symptoms and signs: Secondary | ICD-10-CM | POA: Diagnosis not present

## 2018-07-08 DIAGNOSIS — R590 Localized enlarged lymph nodes: Secondary | ICD-10-CM | POA: Diagnosis not present

## 2018-07-08 DIAGNOSIS — Z5181 Encounter for therapeutic drug level monitoring: Secondary | ICD-10-CM | POA: Diagnosis not present

## 2018-07-08 DIAGNOSIS — J84112 Idiopathic pulmonary fibrosis: Secondary | ICD-10-CM | POA: Diagnosis not present

## 2018-07-08 DIAGNOSIS — R0602 Shortness of breath: Secondary | ICD-10-CM | POA: Diagnosis not present

## 2018-07-08 DIAGNOSIS — Z79899 Other long term (current) drug therapy: Secondary | ICD-10-CM | POA: Diagnosis not present

## 2018-07-08 DIAGNOSIS — N186 End stage renal disease: Secondary | ICD-10-CM | POA: Diagnosis not present

## 2018-07-09 DIAGNOSIS — J84112 Idiopathic pulmonary fibrosis: Secondary | ICD-10-CM | POA: Diagnosis not present

## 2018-07-12 DIAGNOSIS — J84112 Idiopathic pulmonary fibrosis: Secondary | ICD-10-CM | POA: Diagnosis not present

## 2018-07-12 DIAGNOSIS — R6889 Other general symptoms and signs: Secondary | ICD-10-CM | POA: Diagnosis not present

## 2018-07-13 DIAGNOSIS — R6889 Other general symptoms and signs: Secondary | ICD-10-CM | POA: Diagnosis not present

## 2018-07-13 DIAGNOSIS — M7989 Other specified soft tissue disorders: Secondary | ICD-10-CM | POA: Diagnosis not present

## 2018-07-13 DIAGNOSIS — R591 Generalized enlarged lymph nodes: Secondary | ICD-10-CM | POA: Diagnosis not present

## 2018-07-13 DIAGNOSIS — Z7682 Awaiting organ transplant status: Secondary | ICD-10-CM | POA: Diagnosis not present

## 2018-07-14 DIAGNOSIS — J9611 Chronic respiratory failure with hypoxia: Secondary | ICD-10-CM | POA: Diagnosis not present

## 2018-07-14 DIAGNOSIS — R05 Cough: Secondary | ICD-10-CM | POA: Diagnosis not present

## 2018-07-14 DIAGNOSIS — J849 Interstitial pulmonary disease, unspecified: Secondary | ICD-10-CM | POA: Diagnosis not present

## 2018-07-14 DIAGNOSIS — J84112 Idiopathic pulmonary fibrosis: Secondary | ICD-10-CM | POA: Diagnosis not present

## 2018-07-15 DIAGNOSIS — L579 Skin changes due to chronic exposure to nonionizing radiation, unspecified: Secondary | ICD-10-CM | POA: Diagnosis not present

## 2018-07-15 DIAGNOSIS — D225 Melanocytic nevi of trunk: Secondary | ICD-10-CM | POA: Diagnosis not present

## 2018-07-15 DIAGNOSIS — L814 Other melanin hyperpigmentation: Secondary | ICD-10-CM | POA: Diagnosis not present

## 2018-07-15 DIAGNOSIS — L570 Actinic keratosis: Secondary | ICD-10-CM | POA: Diagnosis not present

## 2018-07-15 DIAGNOSIS — D1801 Hemangioma of skin and subcutaneous tissue: Secondary | ICD-10-CM | POA: Diagnosis not present

## 2018-07-16 DIAGNOSIS — J84112 Idiopathic pulmonary fibrosis: Secondary | ICD-10-CM | POA: Diagnosis not present

## 2018-07-16 DIAGNOSIS — H26493 Other secondary cataract, bilateral: Secondary | ICD-10-CM | POA: Insufficient documentation

## 2018-07-19 DIAGNOSIS — J84112 Idiopathic pulmonary fibrosis: Secondary | ICD-10-CM | POA: Diagnosis not present

## 2018-07-21 DIAGNOSIS — J84112 Idiopathic pulmonary fibrosis: Secondary | ICD-10-CM | POA: Diagnosis not present

## 2018-07-23 DIAGNOSIS — J84112 Idiopathic pulmonary fibrosis: Secondary | ICD-10-CM | POA: Diagnosis not present

## 2018-07-27 DIAGNOSIS — H43391 Other vitreous opacities, right eye: Secondary | ICD-10-CM | POA: Insufficient documentation

## 2018-07-28 ENCOUNTER — Telehealth: Payer: Self-pay | Admitting: Pediatrics

## 2018-07-28 NOTE — Telephone Encounter (Signed)
Patient aware of dates hep b vaccines given and will need 3rd dose on 08/26/2018

## 2018-07-28 NOTE — Telephone Encounter (Signed)
Pt wife is wanting to know if pt is update to date on his HEP B states she thinks he has had 2. PT is having to relocate to [place] due to get a lung transplant and needs to have this done. Wife states it is okay to leave message on phone if she doesn't answer.

## 2018-08-03 ENCOUNTER — Ambulatory Visit (INDEPENDENT_AMBULATORY_CARE_PROVIDER_SITE_OTHER): Payer: Medicare Other | Admitting: *Deleted

## 2018-08-03 DIAGNOSIS — R6889 Other general symptoms and signs: Secondary | ICD-10-CM | POA: Diagnosis not present

## 2018-08-03 DIAGNOSIS — Z23 Encounter for immunization: Secondary | ICD-10-CM | POA: Diagnosis not present

## 2018-08-04 DIAGNOSIS — R0609 Other forms of dyspnea: Secondary | ICD-10-CM | POA: Diagnosis not present

## 2018-08-04 DIAGNOSIS — Z7682 Awaiting organ transplant status: Secondary | ICD-10-CM | POA: Diagnosis not present

## 2018-08-04 DIAGNOSIS — J84112 Idiopathic pulmonary fibrosis: Secondary | ICD-10-CM | POA: Diagnosis not present

## 2018-08-04 DIAGNOSIS — R262 Difficulty in walking, not elsewhere classified: Secondary | ICD-10-CM | POA: Diagnosis not present

## 2018-08-04 DIAGNOSIS — R1312 Dysphagia, oropharyngeal phase: Secondary | ICD-10-CM | POA: Diagnosis not present

## 2018-08-04 DIAGNOSIS — R6889 Other general symptoms and signs: Secondary | ICD-10-CM | POA: Diagnosis not present

## 2018-08-04 DIAGNOSIS — Z9981 Dependence on supplemental oxygen: Secondary | ICD-10-CM | POA: Diagnosis not present

## 2018-08-05 DIAGNOSIS — R262 Difficulty in walking, not elsewhere classified: Secondary | ICD-10-CM | POA: Diagnosis not present

## 2018-08-05 DIAGNOSIS — R6889 Other general symptoms and signs: Secondary | ICD-10-CM | POA: Diagnosis not present

## 2018-08-06 DIAGNOSIS — R262 Difficulty in walking, not elsewhere classified: Secondary | ICD-10-CM | POA: Diagnosis not present

## 2018-08-06 DIAGNOSIS — R6889 Other general symptoms and signs: Secondary | ICD-10-CM | POA: Diagnosis not present

## 2018-08-07 DIAGNOSIS — R6889 Other general symptoms and signs: Secondary | ICD-10-CM | POA: Diagnosis not present

## 2018-08-08 DIAGNOSIS — R6889 Other general symptoms and signs: Secondary | ICD-10-CM | POA: Diagnosis not present

## 2018-08-09 DIAGNOSIS — R6889 Other general symptoms and signs: Secondary | ICD-10-CM | POA: Diagnosis not present

## 2018-08-09 DIAGNOSIS — R262 Difficulty in walking, not elsewhere classified: Secondary | ICD-10-CM | POA: Diagnosis not present

## 2018-08-09 DIAGNOSIS — R1312 Dysphagia, oropharyngeal phase: Secondary | ICD-10-CM | POA: Diagnosis not present

## 2018-08-09 DIAGNOSIS — Z9981 Dependence on supplemental oxygen: Secondary | ICD-10-CM | POA: Diagnosis not present

## 2018-08-09 DIAGNOSIS — J84112 Idiopathic pulmonary fibrosis: Secondary | ICD-10-CM | POA: Diagnosis not present

## 2018-08-09 DIAGNOSIS — R0609 Other forms of dyspnea: Secondary | ICD-10-CM | POA: Diagnosis not present

## 2018-08-09 DIAGNOSIS — Z7682 Awaiting organ transplant status: Secondary | ICD-10-CM | POA: Diagnosis not present

## 2018-08-10 DIAGNOSIS — R6889 Other general symptoms and signs: Secondary | ICD-10-CM | POA: Diagnosis not present

## 2018-08-10 DIAGNOSIS — R0609 Other forms of dyspnea: Secondary | ICD-10-CM | POA: Diagnosis not present

## 2018-08-10 DIAGNOSIS — R262 Difficulty in walking, not elsewhere classified: Secondary | ICD-10-CM | POA: Diagnosis not present

## 2018-08-10 DIAGNOSIS — Z9981 Dependence on supplemental oxygen: Secondary | ICD-10-CM | POA: Diagnosis not present

## 2018-08-10 DIAGNOSIS — Z7682 Awaiting organ transplant status: Secondary | ICD-10-CM | POA: Diagnosis not present

## 2018-08-10 DIAGNOSIS — J84112 Idiopathic pulmonary fibrosis: Secondary | ICD-10-CM | POA: Diagnosis not present

## 2018-08-10 DIAGNOSIS — R1312 Dysphagia, oropharyngeal phase: Secondary | ICD-10-CM | POA: Diagnosis not present

## 2018-08-11 DIAGNOSIS — J84112 Idiopathic pulmonary fibrosis: Secondary | ICD-10-CM | POA: Diagnosis not present

## 2018-08-11 DIAGNOSIS — R1312 Dysphagia, oropharyngeal phase: Secondary | ICD-10-CM | POA: Diagnosis not present

## 2018-08-11 DIAGNOSIS — R6889 Other general symptoms and signs: Secondary | ICD-10-CM | POA: Diagnosis not present

## 2018-08-11 DIAGNOSIS — R0609 Other forms of dyspnea: Secondary | ICD-10-CM | POA: Diagnosis not present

## 2018-08-11 DIAGNOSIS — Z7682 Awaiting organ transplant status: Secondary | ICD-10-CM | POA: Diagnosis not present

## 2018-08-11 DIAGNOSIS — R262 Difficulty in walking, not elsewhere classified: Secondary | ICD-10-CM | POA: Diagnosis not present

## 2018-08-11 DIAGNOSIS — Z9981 Dependence on supplemental oxygen: Secondary | ICD-10-CM | POA: Diagnosis not present

## 2018-08-12 DIAGNOSIS — Z7682 Awaiting organ transplant status: Secondary | ICD-10-CM | POA: Diagnosis not present

## 2018-08-12 DIAGNOSIS — R1312 Dysphagia, oropharyngeal phase: Secondary | ICD-10-CM | POA: Diagnosis not present

## 2018-08-12 DIAGNOSIS — R6889 Other general symptoms and signs: Secondary | ICD-10-CM | POA: Diagnosis not present

## 2018-08-12 DIAGNOSIS — R0609 Other forms of dyspnea: Secondary | ICD-10-CM | POA: Diagnosis not present

## 2018-08-12 DIAGNOSIS — J84112 Idiopathic pulmonary fibrosis: Secondary | ICD-10-CM | POA: Diagnosis not present

## 2018-08-12 DIAGNOSIS — Z9981 Dependence on supplemental oxygen: Secondary | ICD-10-CM | POA: Diagnosis not present

## 2018-08-12 DIAGNOSIS — R262 Difficulty in walking, not elsewhere classified: Secondary | ICD-10-CM | POA: Diagnosis not present

## 2018-08-13 DIAGNOSIS — Z9981 Dependence on supplemental oxygen: Secondary | ICD-10-CM | POA: Diagnosis not present

## 2018-08-13 DIAGNOSIS — R6889 Other general symptoms and signs: Secondary | ICD-10-CM | POA: Diagnosis not present

## 2018-08-13 DIAGNOSIS — Z7682 Awaiting organ transplant status: Secondary | ICD-10-CM | POA: Diagnosis not present

## 2018-08-13 DIAGNOSIS — J9611 Chronic respiratory failure with hypoxia: Secondary | ICD-10-CM | POA: Diagnosis not present

## 2018-08-13 DIAGNOSIS — R0609 Other forms of dyspnea: Secondary | ICD-10-CM | POA: Diagnosis not present

## 2018-08-13 DIAGNOSIS — R05 Cough: Secondary | ICD-10-CM | POA: Diagnosis not present

## 2018-08-13 DIAGNOSIS — J84112 Idiopathic pulmonary fibrosis: Secondary | ICD-10-CM | POA: Diagnosis not present

## 2018-08-13 DIAGNOSIS — R262 Difficulty in walking, not elsewhere classified: Secondary | ICD-10-CM | POA: Diagnosis not present

## 2018-08-13 DIAGNOSIS — R1312 Dysphagia, oropharyngeal phase: Secondary | ICD-10-CM | POA: Diagnosis not present

## 2018-08-13 DIAGNOSIS — J849 Interstitial pulmonary disease, unspecified: Secondary | ICD-10-CM | POA: Diagnosis not present

## 2018-08-14 DIAGNOSIS — R6889 Other general symptoms and signs: Secondary | ICD-10-CM | POA: Diagnosis not present

## 2018-08-15 DIAGNOSIS — R6889 Other general symptoms and signs: Secondary | ICD-10-CM | POA: Diagnosis not present

## 2018-08-16 DIAGNOSIS — R0609 Other forms of dyspnea: Secondary | ICD-10-CM | POA: Diagnosis not present

## 2018-08-16 DIAGNOSIS — R262 Difficulty in walking, not elsewhere classified: Secondary | ICD-10-CM | POA: Diagnosis not present

## 2018-08-16 DIAGNOSIS — J84112 Idiopathic pulmonary fibrosis: Secondary | ICD-10-CM | POA: Diagnosis not present

## 2018-08-16 DIAGNOSIS — R6889 Other general symptoms and signs: Secondary | ICD-10-CM | POA: Diagnosis not present

## 2018-08-16 DIAGNOSIS — R1312 Dysphagia, oropharyngeal phase: Secondary | ICD-10-CM | POA: Diagnosis not present

## 2018-08-16 DIAGNOSIS — Z7682 Awaiting organ transplant status: Secondary | ICD-10-CM | POA: Diagnosis not present

## 2018-08-16 DIAGNOSIS — Z9981 Dependence on supplemental oxygen: Secondary | ICD-10-CM | POA: Diagnosis not present

## 2018-08-17 DIAGNOSIS — Z7682 Awaiting organ transplant status: Secondary | ICD-10-CM | POA: Diagnosis not present

## 2018-08-17 DIAGNOSIS — R6889 Other general symptoms and signs: Secondary | ICD-10-CM | POA: Diagnosis not present

## 2018-08-17 DIAGNOSIS — J849 Interstitial pulmonary disease, unspecified: Secondary | ICD-10-CM | POA: Diagnosis not present

## 2018-08-17 DIAGNOSIS — Z23 Encounter for immunization: Secondary | ICD-10-CM | POA: Diagnosis not present

## 2018-08-17 DIAGNOSIS — J9611 Chronic respiratory failure with hypoxia: Secondary | ICD-10-CM | POA: Diagnosis not present

## 2018-08-17 DIAGNOSIS — Z01818 Encounter for other preprocedural examination: Secondary | ICD-10-CM | POA: Diagnosis not present

## 2018-08-17 DIAGNOSIS — J84112 Idiopathic pulmonary fibrosis: Secondary | ICD-10-CM | POA: Diagnosis not present

## 2018-08-17 DIAGNOSIS — J8410 Pulmonary fibrosis, unspecified: Secondary | ICD-10-CM | POA: Diagnosis not present

## 2018-08-18 DIAGNOSIS — R262 Difficulty in walking, not elsewhere classified: Secondary | ICD-10-CM | POA: Diagnosis not present

## 2018-08-18 DIAGNOSIS — R6889 Other general symptoms and signs: Secondary | ICD-10-CM | POA: Diagnosis not present

## 2018-08-19 DIAGNOSIS — Z9981 Dependence on supplemental oxygen: Secondary | ICD-10-CM | POA: Diagnosis not present

## 2018-08-19 DIAGNOSIS — J84112 Idiopathic pulmonary fibrosis: Secondary | ICD-10-CM | POA: Diagnosis not present

## 2018-08-19 DIAGNOSIS — R1312 Dysphagia, oropharyngeal phase: Secondary | ICD-10-CM | POA: Diagnosis not present

## 2018-08-19 DIAGNOSIS — R262 Difficulty in walking, not elsewhere classified: Secondary | ICD-10-CM | POA: Diagnosis not present

## 2018-08-19 DIAGNOSIS — Z7682 Awaiting organ transplant status: Secondary | ICD-10-CM | POA: Diagnosis not present

## 2018-08-19 DIAGNOSIS — R0609 Other forms of dyspnea: Secondary | ICD-10-CM | POA: Diagnosis not present

## 2018-08-19 DIAGNOSIS — R6889 Other general symptoms and signs: Secondary | ICD-10-CM | POA: Diagnosis not present

## 2018-08-20 DIAGNOSIS — R262 Difficulty in walking, not elsewhere classified: Secondary | ICD-10-CM | POA: Diagnosis not present

## 2018-08-20 DIAGNOSIS — R6889 Other general symptoms and signs: Secondary | ICD-10-CM | POA: Diagnosis not present

## 2018-08-20 DIAGNOSIS — Z9981 Dependence on supplemental oxygen: Secondary | ICD-10-CM | POA: Diagnosis not present

## 2018-08-21 DIAGNOSIS — R6889 Other general symptoms and signs: Secondary | ICD-10-CM | POA: Diagnosis not present

## 2018-08-22 DIAGNOSIS — R6889 Other general symptoms and signs: Secondary | ICD-10-CM | POA: Diagnosis not present

## 2018-08-23 DIAGNOSIS — Z9981 Dependence on supplemental oxygen: Secondary | ICD-10-CM | POA: Diagnosis not present

## 2018-08-23 DIAGNOSIS — R6889 Other general symptoms and signs: Secondary | ICD-10-CM | POA: Diagnosis not present

## 2018-08-23 DIAGNOSIS — R262 Difficulty in walking, not elsewhere classified: Secondary | ICD-10-CM | POA: Diagnosis not present

## 2018-08-24 DIAGNOSIS — J84112 Idiopathic pulmonary fibrosis: Secondary | ICD-10-CM | POA: Diagnosis not present

## 2018-08-24 DIAGNOSIS — R6889 Other general symptoms and signs: Secondary | ICD-10-CM | POA: Diagnosis not present

## 2018-08-24 DIAGNOSIS — Z01818 Encounter for other preprocedural examination: Secondary | ICD-10-CM | POA: Diagnosis not present

## 2018-08-24 DIAGNOSIS — Z7682 Awaiting organ transplant status: Secondary | ICD-10-CM | POA: Diagnosis not present

## 2018-08-25 DIAGNOSIS — R262 Difficulty in walking, not elsewhere classified: Secondary | ICD-10-CM | POA: Diagnosis not present

## 2018-08-25 DIAGNOSIS — Z9981 Dependence on supplemental oxygen: Secondary | ICD-10-CM | POA: Diagnosis not present

## 2018-08-25 DIAGNOSIS — R6889 Other general symptoms and signs: Secondary | ICD-10-CM | POA: Diagnosis not present

## 2018-08-26 DIAGNOSIS — R6889 Other general symptoms and signs: Secondary | ICD-10-CM | POA: Diagnosis not present

## 2018-08-26 DIAGNOSIS — Z942 Lung transplant status: Secondary | ICD-10-CM | POA: Diagnosis not present

## 2018-08-26 DIAGNOSIS — Z9981 Dependence on supplemental oxygen: Secondary | ICD-10-CM | POA: Diagnosis not present

## 2018-08-26 DIAGNOSIS — R262 Difficulty in walking, not elsewhere classified: Secondary | ICD-10-CM | POA: Diagnosis not present

## 2018-08-27 DIAGNOSIS — Z9981 Dependence on supplemental oxygen: Secondary | ICD-10-CM | POA: Diagnosis not present

## 2018-08-27 DIAGNOSIS — R6889 Other general symptoms and signs: Secondary | ICD-10-CM | POA: Diagnosis not present

## 2018-08-27 DIAGNOSIS — R262 Difficulty in walking, not elsewhere classified: Secondary | ICD-10-CM | POA: Diagnosis not present

## 2018-08-27 DIAGNOSIS — Z942 Lung transplant status: Secondary | ICD-10-CM | POA: Diagnosis not present

## 2018-08-28 DIAGNOSIS — R6889 Other general symptoms and signs: Secondary | ICD-10-CM | POA: Diagnosis not present

## 2018-08-29 DIAGNOSIS — R6889 Other general symptoms and signs: Secondary | ICD-10-CM | POA: Diagnosis not present

## 2018-08-30 DIAGNOSIS — Z942 Lung transplant status: Secondary | ICD-10-CM | POA: Diagnosis not present

## 2018-08-30 DIAGNOSIS — R262 Difficulty in walking, not elsewhere classified: Secondary | ICD-10-CM | POA: Diagnosis not present

## 2018-08-30 DIAGNOSIS — Z9981 Dependence on supplemental oxygen: Secondary | ICD-10-CM | POA: Diagnosis not present

## 2018-08-30 DIAGNOSIS — R6889 Other general symptoms and signs: Secondary | ICD-10-CM | POA: Diagnosis not present

## 2018-08-31 DIAGNOSIS — R262 Difficulty in walking, not elsewhere classified: Secondary | ICD-10-CM | POA: Diagnosis not present

## 2018-08-31 DIAGNOSIS — Z9981 Dependence on supplemental oxygen: Secondary | ICD-10-CM | POA: Diagnosis not present

## 2018-08-31 DIAGNOSIS — Z942 Lung transplant status: Secondary | ICD-10-CM | POA: Diagnosis not present

## 2018-08-31 DIAGNOSIS — R6889 Other general symptoms and signs: Secondary | ICD-10-CM | POA: Diagnosis not present

## 2018-09-01 ENCOUNTER — Other Ambulatory Visit: Payer: Self-pay

## 2018-09-01 DIAGNOSIS — R262 Difficulty in walking, not elsewhere classified: Secondary | ICD-10-CM | POA: Diagnosis not present

## 2018-09-01 DIAGNOSIS — R6889 Other general symptoms and signs: Secondary | ICD-10-CM | POA: Diagnosis not present

## 2018-09-01 NOTE — Patient Outreach (Signed)
[facility]) Care Management  09/01/2018  Vernon Richard Oct 17, 1951 [id number]   Medication Adherence call to Mr. Dustin Folks left a message for patient to call back patient is showing past due on Pravastatin 40 mg under [place].   [place] Management Direct Dial [phone]  Fax [phone] [email or web address]

## 2018-09-02 DIAGNOSIS — R262 Difficulty in walking, not elsewhere classified: Secondary | ICD-10-CM | POA: Diagnosis not present

## 2018-09-02 DIAGNOSIS — R6889 Other general symptoms and signs: Secondary | ICD-10-CM | POA: Diagnosis not present

## 2018-09-03 DIAGNOSIS — R262 Difficulty in walking, not elsewhere classified: Secondary | ICD-10-CM | POA: Diagnosis not present

## 2018-09-03 DIAGNOSIS — R6889 Other general symptoms and signs: Secondary | ICD-10-CM | POA: Diagnosis not present

## 2018-09-04 DIAGNOSIS — R0981 Nasal congestion: Secondary | ICD-10-CM | POA: Diagnosis not present

## 2018-09-04 DIAGNOSIS — R6889 Other general symptoms and signs: Secondary | ICD-10-CM | POA: Diagnosis not present

## 2018-09-05 DIAGNOSIS — R6889 Other general symptoms and signs: Secondary | ICD-10-CM | POA: Diagnosis not present

## 2018-09-06 DIAGNOSIS — R262 Difficulty in walking, not elsewhere classified: Secondary | ICD-10-CM | POA: Diagnosis not present

## 2018-09-06 DIAGNOSIS — R6889 Other general symptoms and signs: Secondary | ICD-10-CM | POA: Diagnosis not present

## 2018-09-07 DIAGNOSIS — R262 Difficulty in walking, not elsewhere classified: Secondary | ICD-10-CM | POA: Diagnosis not present

## 2018-09-07 DIAGNOSIS — R6889 Other general symptoms and signs: Secondary | ICD-10-CM | POA: Diagnosis not present

## 2018-09-07 DIAGNOSIS — Z942 Lung transplant status: Secondary | ICD-10-CM | POA: Diagnosis not present

## 2018-09-07 DIAGNOSIS — Z9981 Dependence on supplemental oxygen: Secondary | ICD-10-CM | POA: Diagnosis not present

## 2018-09-08 DIAGNOSIS — R6889 Other general symptoms and signs: Secondary | ICD-10-CM | POA: Diagnosis not present

## 2018-09-08 DIAGNOSIS — Z942 Lung transplant status: Secondary | ICD-10-CM | POA: Diagnosis not present

## 2018-09-08 DIAGNOSIS — R262 Difficulty in walking, not elsewhere classified: Secondary | ICD-10-CM | POA: Diagnosis not present

## 2018-09-08 DIAGNOSIS — Z9981 Dependence on supplemental oxygen: Secondary | ICD-10-CM | POA: Diagnosis not present

## 2018-09-09 DIAGNOSIS — Z9981 Dependence on supplemental oxygen: Secondary | ICD-10-CM | POA: Diagnosis not present

## 2018-09-09 DIAGNOSIS — R6889 Other general symptoms and signs: Secondary | ICD-10-CM | POA: Diagnosis not present

## 2018-09-09 DIAGNOSIS — Z942 Lung transplant status: Secondary | ICD-10-CM | POA: Diagnosis not present

## 2018-09-09 DIAGNOSIS — R262 Difficulty in walking, not elsewhere classified: Secondary | ICD-10-CM | POA: Diagnosis not present

## 2018-09-10 DIAGNOSIS — R6889 Other general symptoms and signs: Secondary | ICD-10-CM | POA: Diagnosis not present

## 2018-09-10 DIAGNOSIS — Z9981 Dependence on supplemental oxygen: Secondary | ICD-10-CM | POA: Diagnosis not present

## 2018-09-10 DIAGNOSIS — R262 Difficulty in walking, not elsewhere classified: Secondary | ICD-10-CM | POA: Diagnosis not present

## 2018-09-10 DIAGNOSIS — Z942 Lung transplant status: Secondary | ICD-10-CM | POA: Diagnosis not present

## 2018-09-11 DIAGNOSIS — R6889 Other general symptoms and signs: Secondary | ICD-10-CM | POA: Diagnosis not present

## 2018-09-12 DIAGNOSIS — R6889 Other general symptoms and signs: Secondary | ICD-10-CM | POA: Diagnosis not present

## 2018-09-12 DIAGNOSIS — J849 Interstitial pulmonary disease, unspecified: Secondary | ICD-10-CM | POA: Diagnosis not present

## 2018-09-12 DIAGNOSIS — Z7682 Awaiting organ transplant status: Secondary | ICD-10-CM | POA: Diagnosis not present

## 2018-09-12 DIAGNOSIS — J84112 Idiopathic pulmonary fibrosis: Secondary | ICD-10-CM | POA: Diagnosis not present

## 2018-09-12 DIAGNOSIS — Z79899 Other long term (current) drug therapy: Secondary | ICD-10-CM | POA: Diagnosis not present

## 2018-09-12 DIAGNOSIS — J984 Other disorders of lung: Secondary | ICD-10-CM | POA: Diagnosis not present

## 2018-09-12 DIAGNOSIS — Z7982 Long term (current) use of aspirin: Secondary | ICD-10-CM | POA: Diagnosis not present

## 2018-09-12 DIAGNOSIS — J8410 Pulmonary fibrosis, unspecified: Secondary | ICD-10-CM | POA: Diagnosis not present

## 2018-09-12 DIAGNOSIS — J9611 Chronic respiratory failure with hypoxia: Secondary | ICD-10-CM | POA: Diagnosis not present

## 2018-09-13 DIAGNOSIS — J849 Interstitial pulmonary disease, unspecified: Secondary | ICD-10-CM | POA: Diagnosis not present

## 2018-09-13 DIAGNOSIS — R05 Cough: Secondary | ICD-10-CM | POA: Diagnosis not present

## 2018-09-13 DIAGNOSIS — R1312 Dysphagia, oropharyngeal phase: Secondary | ICD-10-CM | POA: Diagnosis not present

## 2018-09-13 DIAGNOSIS — R6889 Other general symptoms and signs: Secondary | ICD-10-CM | POA: Diagnosis not present

## 2018-09-13 DIAGNOSIS — J9611 Chronic respiratory failure with hypoxia: Secondary | ICD-10-CM | POA: Diagnosis not present

## 2018-09-14 ENCOUNTER — Other Ambulatory Visit: Payer: Self-pay | Admitting: *Deleted

## 2018-09-14 DIAGNOSIS — R262 Difficulty in walking, not elsewhere classified: Secondary | ICD-10-CM | POA: Diagnosis not present

## 2018-09-14 DIAGNOSIS — R6889 Other general symptoms and signs: Secondary | ICD-10-CM | POA: Diagnosis not present

## 2018-09-14 MED ORDER — LOSARTAN POTASSIUM 50 MG PO TABS: 50.0000 mg | ORAL_TABLET | Freq: Every day | ORAL | 1 refills | Status: DC

## 2018-09-15 DIAGNOSIS — J849 Interstitial pulmonary disease, unspecified: Secondary | ICD-10-CM | POA: Diagnosis not present

## 2018-09-15 DIAGNOSIS — J9811 Atelectasis: Secondary | ICD-10-CM | POA: Diagnosis not present

## 2018-09-15 DIAGNOSIS — T380X5A Adverse effect of glucocorticoids and synthetic analogues, initial encounter: Secondary | ICD-10-CM | POA: Diagnosis not present

## 2018-09-15 DIAGNOSIS — R9389 Abnormal findings on diagnostic imaging of other specified body structures: Secondary | ICD-10-CM | POA: Diagnosis not present

## 2018-09-15 DIAGNOSIS — I959 Hypotension, unspecified: Secondary | ICD-10-CM | POA: Diagnosis not present

## 2018-09-15 DIAGNOSIS — R1312 Dysphagia, oropharyngeal phase: Secondary | ICD-10-CM | POA: Diagnosis not present

## 2018-09-15 DIAGNOSIS — R6889 Other general symptoms and signs: Secondary | ICD-10-CM | POA: Diagnosis not present

## 2018-09-15 DIAGNOSIS — R918 Other nonspecific abnormal finding of lung field: Secondary | ICD-10-CM | POA: Diagnosis not present

## 2018-09-15 DIAGNOSIS — N179 Acute kidney failure, unspecified: Secondary | ICD-10-CM | POA: Diagnosis not present

## 2018-09-15 DIAGNOSIS — J948 Other specified pleural conditions: Secondary | ICD-10-CM | POA: Diagnosis not present

## 2018-09-15 DIAGNOSIS — J984 Other disorders of lung: Secondary | ICD-10-CM | POA: Diagnosis not present

## 2018-09-15 DIAGNOSIS — R5381 Other malaise: Secondary | ICD-10-CM | POA: Diagnosis not present

## 2018-09-15 DIAGNOSIS — I509 Heart failure, unspecified: Secondary | ICD-10-CM | POA: Diagnosis not present

## 2018-09-15 DIAGNOSIS — Z9189 Other specified personal risk factors, not elsewhere classified: Secondary | ICD-10-CM | POA: Diagnosis not present

## 2018-09-15 DIAGNOSIS — Z79899 Other long term (current) drug therapy: Secondary | ICD-10-CM | POA: Diagnosis not present

## 2018-09-15 DIAGNOSIS — Z4682 Encounter for fitting and adjustment of non-vascular catheter: Secondary | ICD-10-CM | POA: Diagnosis not present

## 2018-09-15 DIAGNOSIS — Z7982 Long term (current) use of aspirin: Secondary | ICD-10-CM | POA: Diagnosis not present

## 2018-09-15 DIAGNOSIS — Z6824 Body mass index (BMI) 24.0-24.9, adult: Secondary | ICD-10-CM | POA: Diagnosis not present

## 2018-09-15 DIAGNOSIS — Z800 Family history of malignant neoplasm of digestive organs: Secondary | ICD-10-CM | POA: Diagnosis not present

## 2018-09-15 DIAGNOSIS — Z9981 Dependence on supplemental oxygen: Secondary | ICD-10-CM | POA: Diagnosis not present

## 2018-09-15 DIAGNOSIS — J982 Interstitial emphysema: Secondary | ICD-10-CM | POA: Diagnosis not present

## 2018-09-15 DIAGNOSIS — Z942 Lung transplant status: Secondary | ICD-10-CM | POA: Diagnosis not present

## 2018-09-15 DIAGNOSIS — Z7682 Awaiting organ transplant status: Secondary | ICD-10-CM | POA: Diagnosis not present

## 2018-09-15 DIAGNOSIS — R6881 Early satiety: Secondary | ICD-10-CM | POA: Diagnosis not present

## 2018-09-15 DIAGNOSIS — J9611 Chronic respiratory failure with hypoxia: Secondary | ICD-10-CM | POA: Diagnosis not present

## 2018-09-15 DIAGNOSIS — D899 Disorder involving the immune mechanism, unspecified: Secondary | ICD-10-CM | POA: Diagnosis not present

## 2018-09-15 DIAGNOSIS — G8918 Other acute postprocedural pain: Secondary | ICD-10-CM | POA: Diagnosis not present

## 2018-09-15 DIAGNOSIS — E785 Hyperlipidemia, unspecified: Secondary | ICD-10-CM | POA: Diagnosis not present

## 2018-09-15 DIAGNOSIS — E1165 Type 2 diabetes mellitus with hyperglycemia: Secondary | ICD-10-CM | POA: Diagnosis not present

## 2018-09-15 DIAGNOSIS — T797XXA Traumatic subcutaneous emphysema, initial encounter: Secondary | ICD-10-CM | POA: Diagnosis not present

## 2018-09-15 DIAGNOSIS — R0989 Other specified symptoms and signs involving the circulatory and respiratory systems: Secondary | ICD-10-CM | POA: Diagnosis not present

## 2018-09-15 DIAGNOSIS — R739 Hyperglycemia, unspecified: Secondary | ICD-10-CM | POA: Diagnosis not present

## 2018-09-15 DIAGNOSIS — E440 Moderate protein-calorie malnutrition: Secondary | ICD-10-CM | POA: Diagnosis not present

## 2018-09-15 DIAGNOSIS — I739 Peripheral vascular disease, unspecified: Secondary | ICD-10-CM | POA: Diagnosis not present

## 2018-09-15 DIAGNOSIS — I110 Hypertensive heart disease with heart failure: Secondary | ICD-10-CM | POA: Diagnosis not present

## 2018-09-15 DIAGNOSIS — I517 Cardiomegaly: Secondary | ICD-10-CM | POA: Diagnosis not present

## 2018-09-15 DIAGNOSIS — I480 Paroxysmal atrial fibrillation: Secondary | ICD-10-CM | POA: Diagnosis not present

## 2018-09-15 DIAGNOSIS — J95821 Acute postprocedural respiratory failure: Secondary | ICD-10-CM | POA: Diagnosis not present

## 2018-09-15 DIAGNOSIS — J8410 Pulmonary fibrosis, unspecified: Secondary | ICD-10-CM | POA: Diagnosis not present

## 2018-09-15 DIAGNOSIS — J939 Pneumothorax, unspecified: Secondary | ICD-10-CM | POA: Diagnosis not present

## 2018-09-15 DIAGNOSIS — J84112 Idiopathic pulmonary fibrosis: Secondary | ICD-10-CM | POA: Diagnosis not present

## 2018-09-15 DIAGNOSIS — G8912 Acute post-thoracotomy pain: Secondary | ICD-10-CM | POA: Diagnosis not present

## 2018-09-15 HISTORY — PX: LUNG TRANSPLANT, SINGLE: SHX705

## 2018-09-16 DIAGNOSIS — Z942 Lung transplant status: Secondary | ICD-10-CM | POA: Insufficient documentation

## 2018-09-20 DIAGNOSIS — R1312 Dysphagia, oropharyngeal phase: Secondary | ICD-10-CM

## 2018-09-20 HISTORY — DX: Dysphagia, oropharyngeal phase: R13.12

## 2018-10-02 DIAGNOSIS — R6889 Other general symptoms and signs: Secondary | ICD-10-CM | POA: Diagnosis not present

## 2018-10-03 DIAGNOSIS — R6889 Other general symptoms and signs: Secondary | ICD-10-CM | POA: Diagnosis not present

## 2018-10-04 DIAGNOSIS — R262 Difficulty in walking, not elsewhere classified: Secondary | ICD-10-CM | POA: Diagnosis not present

## 2018-10-04 DIAGNOSIS — Z4889 Encounter for other specified surgical aftercare: Secondary | ICD-10-CM | POA: Diagnosis not present

## 2018-10-04 DIAGNOSIS — R0609 Other forms of dyspnea: Secondary | ICD-10-CM | POA: Diagnosis not present

## 2018-10-04 DIAGNOSIS — Z942 Lung transplant status: Secondary | ICD-10-CM | POA: Diagnosis not present

## 2018-10-05 DIAGNOSIS — R262 Difficulty in walking, not elsewhere classified: Secondary | ICD-10-CM | POA: Diagnosis not present

## 2018-10-05 DIAGNOSIS — Z942 Lung transplant status: Secondary | ICD-10-CM | POA: Diagnosis not present

## 2018-10-05 DIAGNOSIS — Z4889 Encounter for other specified surgical aftercare: Secondary | ICD-10-CM | POA: Diagnosis not present

## 2018-10-05 DIAGNOSIS — R0609 Other forms of dyspnea: Secondary | ICD-10-CM | POA: Diagnosis not present

## 2018-10-06 DIAGNOSIS — J84112 Idiopathic pulmonary fibrosis: Secondary | ICD-10-CM | POA: Diagnosis not present

## 2018-10-06 DIAGNOSIS — E139 Other specified diabetes mellitus without complications: Secondary | ICD-10-CM | POA: Diagnosis not present

## 2018-10-06 DIAGNOSIS — Z4824 Encounter for aftercare following lung transplant: Secondary | ICD-10-CM | POA: Diagnosis not present

## 2018-10-06 DIAGNOSIS — I10 Essential (primary) hypertension: Secondary | ICD-10-CM | POA: Diagnosis not present

## 2018-10-06 DIAGNOSIS — Z713 Dietary counseling and surveillance: Secondary | ICD-10-CM | POA: Diagnosis not present

## 2018-10-06 DIAGNOSIS — E1122 Type 2 diabetes mellitus with diabetic chronic kidney disease: Secondary | ICD-10-CM | POA: Diagnosis not present

## 2018-10-06 DIAGNOSIS — Z79899 Other long term (current) drug therapy: Secondary | ICD-10-CM | POA: Diagnosis not present

## 2018-10-06 DIAGNOSIS — Z48298 Encounter for aftercare following other organ transplant: Secondary | ICD-10-CM | POA: Diagnosis not present

## 2018-10-06 DIAGNOSIS — Z942 Lung transplant status: Secondary | ICD-10-CM | POA: Diagnosis not present

## 2018-10-06 DIAGNOSIS — J90 Pleural effusion, not elsewhere classified: Secondary | ICD-10-CM | POA: Diagnosis not present

## 2018-10-06 DIAGNOSIS — Z794 Long term (current) use of insulin: Secondary | ICD-10-CM | POA: Diagnosis not present

## 2018-10-06 DIAGNOSIS — T797XXA Traumatic subcutaneous emphysema, initial encounter: Secondary | ICD-10-CM | POA: Diagnosis not present

## 2018-10-06 DIAGNOSIS — N182 Chronic kidney disease, stage 2 (mild): Secondary | ICD-10-CM | POA: Diagnosis not present

## 2018-10-06 DIAGNOSIS — J849 Interstitial pulmonary disease, unspecified: Secondary | ICD-10-CM | POA: Diagnosis not present

## 2018-10-06 DIAGNOSIS — R110 Nausea: Secondary | ICD-10-CM | POA: Diagnosis not present

## 2018-10-06 DIAGNOSIS — Z5181 Encounter for therapeutic drug level monitoring: Secondary | ICD-10-CM | POA: Diagnosis not present

## 2018-10-07 DIAGNOSIS — Z942 Lung transplant status: Secondary | ICD-10-CM | POA: Diagnosis not present

## 2018-10-07 DIAGNOSIS — R262 Difficulty in walking, not elsewhere classified: Secondary | ICD-10-CM | POA: Diagnosis not present

## 2018-10-07 DIAGNOSIS — Z4889 Encounter for other specified surgical aftercare: Secondary | ICD-10-CM | POA: Diagnosis not present

## 2018-10-07 DIAGNOSIS — R0609 Other forms of dyspnea: Secondary | ICD-10-CM | POA: Diagnosis not present

## 2018-10-08 DIAGNOSIS — R262 Difficulty in walking, not elsewhere classified: Secondary | ICD-10-CM | POA: Diagnosis not present

## 2018-10-08 DIAGNOSIS — Z942 Lung transplant status: Secondary | ICD-10-CM | POA: Diagnosis not present

## 2018-10-08 DIAGNOSIS — Z4889 Encounter for other specified surgical aftercare: Secondary | ICD-10-CM | POA: Diagnosis not present

## 2018-10-08 DIAGNOSIS — R0609 Other forms of dyspnea: Secondary | ICD-10-CM | POA: Diagnosis not present

## 2018-10-11 DIAGNOSIS — E1122 Type 2 diabetes mellitus with diabetic chronic kidney disease: Secondary | ICD-10-CM | POA: Diagnosis not present

## 2018-10-11 DIAGNOSIS — Z7901 Long term (current) use of anticoagulants: Secondary | ICD-10-CM | POA: Diagnosis not present

## 2018-10-11 DIAGNOSIS — D899 Disorder involving the immune mechanism, unspecified: Secondary | ICD-10-CM | POA: Diagnosis not present

## 2018-10-11 DIAGNOSIS — Z4824 Encounter for aftercare following lung transplant: Secondary | ICD-10-CM | POA: Diagnosis not present

## 2018-10-11 DIAGNOSIS — E875 Hyperkalemia: Secondary | ICD-10-CM | POA: Diagnosis not present

## 2018-10-11 DIAGNOSIS — N182 Chronic kidney disease, stage 2 (mild): Secondary | ICD-10-CM | POA: Diagnosis not present

## 2018-10-11 DIAGNOSIS — E785 Hyperlipidemia, unspecified: Secondary | ICD-10-CM | POA: Diagnosis not present

## 2018-10-11 DIAGNOSIS — D849 Immunodeficiency, unspecified: Secondary | ICD-10-CM | POA: Insufficient documentation

## 2018-10-11 DIAGNOSIS — R918 Other nonspecific abnormal finding of lung field: Secondary | ICD-10-CM | POA: Diagnosis not present

## 2018-10-11 DIAGNOSIS — Z794 Long term (current) use of insulin: Secondary | ICD-10-CM | POA: Diagnosis not present

## 2018-10-11 DIAGNOSIS — K5900 Constipation, unspecified: Secondary | ICD-10-CM | POA: Diagnosis not present

## 2018-10-11 DIAGNOSIS — Z79899 Other long term (current) drug therapy: Secondary | ICD-10-CM | POA: Diagnosis not present

## 2018-10-11 DIAGNOSIS — Z48298 Encounter for aftercare following other organ transplant: Secondary | ICD-10-CM | POA: Diagnosis not present

## 2018-10-11 DIAGNOSIS — Z713 Dietary counseling and surveillance: Secondary | ICD-10-CM | POA: Diagnosis not present

## 2018-10-11 DIAGNOSIS — Z942 Lung transplant status: Secondary | ICD-10-CM | POA: Diagnosis not present

## 2018-10-11 DIAGNOSIS — I10 Essential (primary) hypertension: Secondary | ICD-10-CM | POA: Diagnosis not present

## 2018-10-12 DIAGNOSIS — Z4889 Encounter for other specified surgical aftercare: Secondary | ICD-10-CM | POA: Diagnosis not present

## 2018-10-12 DIAGNOSIS — Z942 Lung transplant status: Secondary | ICD-10-CM | POA: Diagnosis not present

## 2018-10-12 DIAGNOSIS — R0609 Other forms of dyspnea: Secondary | ICD-10-CM | POA: Diagnosis not present

## 2018-10-12 DIAGNOSIS — R262 Difficulty in walking, not elsewhere classified: Secondary | ICD-10-CM | POA: Diagnosis not present

## 2018-10-13 DIAGNOSIS — R05 Cough: Secondary | ICD-10-CM | POA: Diagnosis not present

## 2018-10-13 DIAGNOSIS — J9611 Chronic respiratory failure with hypoxia: Secondary | ICD-10-CM | POA: Diagnosis not present

## 2018-10-13 DIAGNOSIS — J849 Interstitial pulmonary disease, unspecified: Secondary | ICD-10-CM | POA: Diagnosis not present

## 2018-10-14 DIAGNOSIS — R0609 Other forms of dyspnea: Secondary | ICD-10-CM | POA: Diagnosis not present

## 2018-10-14 DIAGNOSIS — Z942 Lung transplant status: Secondary | ICD-10-CM | POA: Diagnosis not present

## 2018-10-14 DIAGNOSIS — R262 Difficulty in walking, not elsewhere classified: Secondary | ICD-10-CM | POA: Diagnosis not present

## 2018-10-14 DIAGNOSIS — Z4889 Encounter for other specified surgical aftercare: Secondary | ICD-10-CM | POA: Diagnosis not present

## 2018-10-15 DIAGNOSIS — Z4889 Encounter for other specified surgical aftercare: Secondary | ICD-10-CM | POA: Diagnosis not present

## 2018-10-15 DIAGNOSIS — Z942 Lung transplant status: Secondary | ICD-10-CM | POA: Diagnosis not present

## 2018-10-15 DIAGNOSIS — R0609 Other forms of dyspnea: Secondary | ICD-10-CM | POA: Diagnosis not present

## 2018-10-15 DIAGNOSIS — R262 Difficulty in walking, not elsewhere classified: Secondary | ICD-10-CM | POA: Diagnosis not present

## 2018-10-18 DIAGNOSIS — Z942 Lung transplant status: Secondary | ICD-10-CM | POA: Diagnosis not present

## 2018-10-18 DIAGNOSIS — Z79899 Other long term (current) drug therapy: Secondary | ICD-10-CM | POA: Diagnosis not present

## 2018-10-18 DIAGNOSIS — T86810 Lung transplant rejection: Secondary | ICD-10-CM | POA: Diagnosis not present

## 2018-10-18 DIAGNOSIS — E785 Hyperlipidemia, unspecified: Secondary | ICD-10-CM | POA: Diagnosis not present

## 2018-10-18 DIAGNOSIS — Z7901 Long term (current) use of anticoagulants: Secondary | ICD-10-CM | POA: Diagnosis not present

## 2018-10-18 DIAGNOSIS — Z794 Long term (current) use of insulin: Secondary | ICD-10-CM | POA: Diagnosis not present

## 2018-10-18 DIAGNOSIS — Z48298 Encounter for aftercare following other organ transplant: Secondary | ICD-10-CM | POA: Diagnosis not present

## 2018-10-18 DIAGNOSIS — Z4824 Encounter for aftercare following lung transplant: Secondary | ICD-10-CM | POA: Diagnosis not present

## 2018-10-18 DIAGNOSIS — I10 Essential (primary) hypertension: Secondary | ICD-10-CM | POA: Diagnosis not present

## 2018-10-18 DIAGNOSIS — Z8709 Personal history of other diseases of the respiratory system: Secondary | ICD-10-CM | POA: Diagnosis not present

## 2018-10-19 DIAGNOSIS — G8918 Other acute postprocedural pain: Secondary | ICD-10-CM | POA: Diagnosis not present

## 2018-10-19 DIAGNOSIS — I10 Essential (primary) hypertension: Secondary | ICD-10-CM | POA: Diagnosis not present

## 2018-10-19 DIAGNOSIS — D899 Disorder involving the immune mechanism, unspecified: Secondary | ICD-10-CM | POA: Diagnosis not present

## 2018-10-19 DIAGNOSIS — Z4802 Encounter for removal of sutures: Secondary | ICD-10-CM | POA: Diagnosis not present

## 2018-10-19 DIAGNOSIS — N183 Chronic kidney disease, stage 3 (moderate): Secondary | ICD-10-CM | POA: Diagnosis not present

## 2018-10-19 DIAGNOSIS — Z942 Lung transplant status: Secondary | ICD-10-CM | POA: Diagnosis not present

## 2018-10-19 DIAGNOSIS — R1312 Dysphagia, oropharyngeal phase: Secondary | ICD-10-CM | POA: Diagnosis not present

## 2018-10-20 DIAGNOSIS — R6889 Other general symptoms and signs: Secondary | ICD-10-CM | POA: Diagnosis not present

## 2018-10-21 DIAGNOSIS — R262 Difficulty in walking, not elsewhere classified: Secondary | ICD-10-CM | POA: Diagnosis not present

## 2018-10-21 DIAGNOSIS — R1312 Dysphagia, oropharyngeal phase: Secondary | ICD-10-CM | POA: Diagnosis not present

## 2018-10-21 DIAGNOSIS — R6889 Other general symptoms and signs: Secondary | ICD-10-CM | POA: Diagnosis not present

## 2018-10-21 DIAGNOSIS — Z4889 Encounter for other specified surgical aftercare: Secondary | ICD-10-CM | POA: Diagnosis not present

## 2018-10-22 DIAGNOSIS — R6889 Other general symptoms and signs: Secondary | ICD-10-CM | POA: Diagnosis not present

## 2018-10-22 DIAGNOSIS — T380X5A Adverse effect of glucocorticoids and synthetic analogues, initial encounter: Secondary | ICD-10-CM | POA: Diagnosis not present

## 2018-10-22 DIAGNOSIS — N183 Chronic kidney disease, stage 3 unspecified: Secondary | ICD-10-CM | POA: Insufficient documentation

## 2018-10-22 DIAGNOSIS — Z7952 Long term (current) use of systemic steroids: Secondary | ICD-10-CM | POA: Diagnosis not present

## 2018-10-22 DIAGNOSIS — R739 Hyperglycemia, unspecified: Secondary | ICD-10-CM | POA: Diagnosis not present

## 2018-10-22 DIAGNOSIS — Z942 Lung transplant status: Secondary | ICD-10-CM | POA: Diagnosis not present

## 2018-10-23 DIAGNOSIS — R6889 Other general symptoms and signs: Secondary | ICD-10-CM | POA: Diagnosis not present

## 2018-10-24 DIAGNOSIS — R6889 Other general symptoms and signs: Secondary | ICD-10-CM | POA: Diagnosis not present

## 2018-10-25 DIAGNOSIS — R1312 Dysphagia, oropharyngeal phase: Secondary | ICD-10-CM | POA: Diagnosis not present

## 2018-10-25 DIAGNOSIS — R6889 Other general symptoms and signs: Secondary | ICD-10-CM | POA: Diagnosis not present

## 2018-10-25 DIAGNOSIS — Z4889 Encounter for other specified surgical aftercare: Secondary | ICD-10-CM | POA: Diagnosis not present

## 2018-10-25 DIAGNOSIS — R262 Difficulty in walking, not elsewhere classified: Secondary | ICD-10-CM | POA: Diagnosis not present

## 2018-10-26 DIAGNOSIS — R1312 Dysphagia, oropharyngeal phase: Secondary | ICD-10-CM | POA: Diagnosis not present

## 2018-10-26 DIAGNOSIS — R6889 Other general symptoms and signs: Secondary | ICD-10-CM | POA: Diagnosis not present

## 2018-10-26 DIAGNOSIS — Z48298 Encounter for aftercare following other organ transplant: Secondary | ICD-10-CM | POA: Diagnosis not present

## 2018-10-26 DIAGNOSIS — Z4824 Encounter for aftercare following lung transplant: Secondary | ICD-10-CM | POA: Diagnosis not present

## 2018-10-26 DIAGNOSIS — J90 Pleural effusion, not elsewhere classified: Secondary | ICD-10-CM | POA: Diagnosis not present

## 2018-10-26 DIAGNOSIS — G8918 Other acute postprocedural pain: Secondary | ICD-10-CM | POA: Diagnosis not present

## 2018-10-26 DIAGNOSIS — J8410 Pulmonary fibrosis, unspecified: Secondary | ICD-10-CM | POA: Diagnosis not present

## 2018-10-26 DIAGNOSIS — R918 Other nonspecific abnormal finding of lung field: Secondary | ICD-10-CM | POA: Diagnosis not present

## 2018-10-26 DIAGNOSIS — M1991 Primary osteoarthritis, unspecified site: Secondary | ICD-10-CM | POA: Diagnosis not present

## 2018-10-26 DIAGNOSIS — Z942 Lung transplant status: Secondary | ICD-10-CM | POA: Diagnosis not present

## 2018-10-26 DIAGNOSIS — N183 Chronic kidney disease, stage 3 (moderate): Secondary | ICD-10-CM | POA: Diagnosis not present

## 2018-10-26 DIAGNOSIS — D899 Disorder involving the immune mechanism, unspecified: Secondary | ICD-10-CM | POA: Diagnosis not present

## 2018-10-26 DIAGNOSIS — I10 Essential (primary) hypertension: Secondary | ICD-10-CM | POA: Diagnosis not present

## 2018-10-27 DIAGNOSIS — Z4889 Encounter for other specified surgical aftercare: Secondary | ICD-10-CM | POA: Diagnosis not present

## 2018-10-27 DIAGNOSIS — R262 Difficulty in walking, not elsewhere classified: Secondary | ICD-10-CM | POA: Diagnosis not present

## 2018-10-27 DIAGNOSIS — R1312 Dysphagia, oropharyngeal phase: Secondary | ICD-10-CM | POA: Diagnosis not present

## 2018-10-27 DIAGNOSIS — R6889 Other general symptoms and signs: Secondary | ICD-10-CM | POA: Diagnosis not present

## 2018-10-28 DIAGNOSIS — R262 Difficulty in walking, not elsewhere classified: Secondary | ICD-10-CM | POA: Diagnosis not present

## 2018-10-28 DIAGNOSIS — Z4889 Encounter for other specified surgical aftercare: Secondary | ICD-10-CM | POA: Diagnosis not present

## 2018-10-28 DIAGNOSIS — R1312 Dysphagia, oropharyngeal phase: Secondary | ICD-10-CM | POA: Diagnosis not present

## 2018-10-28 DIAGNOSIS — R6889 Other general symptoms and signs: Secondary | ICD-10-CM | POA: Diagnosis not present

## 2018-10-29 DIAGNOSIS — R262 Difficulty in walking, not elsewhere classified: Secondary | ICD-10-CM | POA: Diagnosis not present

## 2018-10-29 DIAGNOSIS — R6889 Other general symptoms and signs: Secondary | ICD-10-CM | POA: Diagnosis not present

## 2018-10-29 DIAGNOSIS — R1312 Dysphagia, oropharyngeal phase: Secondary | ICD-10-CM | POA: Diagnosis not present

## 2018-10-29 DIAGNOSIS — Z4889 Encounter for other specified surgical aftercare: Secondary | ICD-10-CM | POA: Diagnosis not present

## 2018-10-30 DIAGNOSIS — R6889 Other general symptoms and signs: Secondary | ICD-10-CM | POA: Diagnosis not present

## 2018-10-31 DIAGNOSIS — R6889 Other general symptoms and signs: Secondary | ICD-10-CM | POA: Diagnosis not present

## 2018-11-01 DIAGNOSIS — Z5181 Encounter for therapeutic drug level monitoring: Secondary | ICD-10-CM | POA: Diagnosis not present

## 2018-11-01 DIAGNOSIS — R6889 Other general symptoms and signs: Secondary | ICD-10-CM | POA: Diagnosis not present

## 2018-11-01 DIAGNOSIS — Z79899 Other long term (current) drug therapy: Secondary | ICD-10-CM | POA: Diagnosis not present

## 2018-11-01 DIAGNOSIS — R1312 Dysphagia, oropharyngeal phase: Secondary | ICD-10-CM | POA: Diagnosis not present

## 2018-11-01 DIAGNOSIS — T86818 Other complications of lung transplant: Secondary | ICD-10-CM | POA: Diagnosis not present

## 2018-11-01 DIAGNOSIS — Z4889 Encounter for other specified surgical aftercare: Secondary | ICD-10-CM | POA: Diagnosis not present

## 2018-11-01 DIAGNOSIS — R262 Difficulty in walking, not elsewhere classified: Secondary | ICD-10-CM | POA: Diagnosis not present

## 2018-11-02 DIAGNOSIS — R6889 Other general symptoms and signs: Secondary | ICD-10-CM | POA: Diagnosis not present

## 2018-11-02 DIAGNOSIS — Z4889 Encounter for other specified surgical aftercare: Secondary | ICD-10-CM | POA: Diagnosis not present

## 2018-11-02 DIAGNOSIS — R1312 Dysphagia, oropharyngeal phase: Secondary | ICD-10-CM | POA: Diagnosis not present

## 2018-11-02 DIAGNOSIS — R262 Difficulty in walking, not elsewhere classified: Secondary | ICD-10-CM | POA: Diagnosis not present

## 2018-11-03 DIAGNOSIS — Z4889 Encounter for other specified surgical aftercare: Secondary | ICD-10-CM | POA: Diagnosis not present

## 2018-11-03 DIAGNOSIS — R1312 Dysphagia, oropharyngeal phase: Secondary | ICD-10-CM | POA: Diagnosis not present

## 2018-11-03 DIAGNOSIS — R6889 Other general symptoms and signs: Secondary | ICD-10-CM | POA: Diagnosis not present

## 2018-11-03 DIAGNOSIS — R262 Difficulty in walking, not elsewhere classified: Secondary | ICD-10-CM | POA: Diagnosis not present

## 2018-11-04 DIAGNOSIS — Z4889 Encounter for other specified surgical aftercare: Secondary | ICD-10-CM | POA: Diagnosis not present

## 2018-11-04 DIAGNOSIS — R1312 Dysphagia, oropharyngeal phase: Secondary | ICD-10-CM | POA: Diagnosis not present

## 2018-11-04 DIAGNOSIS — R262 Difficulty in walking, not elsewhere classified: Secondary | ICD-10-CM | POA: Diagnosis not present

## 2018-11-04 DIAGNOSIS — R6889 Other general symptoms and signs: Secondary | ICD-10-CM | POA: Diagnosis not present

## 2018-11-05 DIAGNOSIS — R1312 Dysphagia, oropharyngeal phase: Secondary | ICD-10-CM | POA: Diagnosis not present

## 2018-11-05 DIAGNOSIS — R262 Difficulty in walking, not elsewhere classified: Secondary | ICD-10-CM | POA: Diagnosis not present

## 2018-11-05 DIAGNOSIS — R6889 Other general symptoms and signs: Secondary | ICD-10-CM | POA: Diagnosis not present

## 2018-11-05 DIAGNOSIS — Z4889 Encounter for other specified surgical aftercare: Secondary | ICD-10-CM | POA: Diagnosis not present

## 2018-11-06 DIAGNOSIS — R6889 Other general symptoms and signs: Secondary | ICD-10-CM | POA: Diagnosis not present

## 2018-11-07 DIAGNOSIS — R6889 Other general symptoms and signs: Secondary | ICD-10-CM | POA: Diagnosis not present

## 2018-11-08 DIAGNOSIS — R6889 Other general symptoms and signs: Secondary | ICD-10-CM | POA: Diagnosis not present

## 2018-11-09 DIAGNOSIS — Z48298 Encounter for aftercare following other organ transplant: Secondary | ICD-10-CM | POA: Diagnosis not present

## 2018-11-09 DIAGNOSIS — R6889 Other general symptoms and signs: Secondary | ICD-10-CM | POA: Diagnosis not present

## 2018-11-09 DIAGNOSIS — Z942 Lung transplant status: Secondary | ICD-10-CM | POA: Diagnosis not present

## 2018-11-10 DIAGNOSIS — R6889 Other general symptoms and signs: Secondary | ICD-10-CM | POA: Diagnosis not present

## 2018-11-11 DIAGNOSIS — Z4824 Encounter for aftercare following lung transplant: Secondary | ICD-10-CM | POA: Diagnosis not present

## 2018-11-11 DIAGNOSIS — R6889 Other general symptoms and signs: Secondary | ICD-10-CM | POA: Diagnosis not present

## 2018-11-11 DIAGNOSIS — E119 Type 2 diabetes mellitus without complications: Secondary | ICD-10-CM | POA: Diagnosis not present

## 2018-11-11 DIAGNOSIS — D899 Disorder involving the immune mechanism, unspecified: Secondary | ICD-10-CM | POA: Diagnosis not present

## 2018-11-11 DIAGNOSIS — I517 Cardiomegaly: Secondary | ICD-10-CM | POA: Diagnosis not present

## 2018-11-11 DIAGNOSIS — Z942 Lung transplant status: Secondary | ICD-10-CM | POA: Diagnosis not present

## 2018-11-11 DIAGNOSIS — Z4889 Encounter for other specified surgical aftercare: Secondary | ICD-10-CM | POA: Diagnosis not present

## 2018-11-11 DIAGNOSIS — R1312 Dysphagia, oropharyngeal phase: Secondary | ICD-10-CM | POA: Diagnosis not present

## 2018-11-11 DIAGNOSIS — I4891 Unspecified atrial fibrillation: Secondary | ICD-10-CM | POA: Diagnosis not present

## 2018-11-11 DIAGNOSIS — I10 Essential (primary) hypertension: Secondary | ICD-10-CM | POA: Diagnosis not present

## 2018-11-11 DIAGNOSIS — Z79899 Other long term (current) drug therapy: Secondary | ICD-10-CM | POA: Diagnosis not present

## 2018-11-11 DIAGNOSIS — Z794 Long term (current) use of insulin: Secondary | ICD-10-CM | POA: Diagnosis not present

## 2018-11-11 DIAGNOSIS — J8410 Pulmonary fibrosis, unspecified: Secondary | ICD-10-CM | POA: Diagnosis not present

## 2018-11-11 DIAGNOSIS — Z5181 Encounter for therapeutic drug level monitoring: Secondary | ICD-10-CM | POA: Diagnosis not present

## 2018-11-11 DIAGNOSIS — R262 Difficulty in walking, not elsewhere classified: Secondary | ICD-10-CM | POA: Diagnosis not present

## 2018-11-11 DIAGNOSIS — R251 Tremor, unspecified: Secondary | ICD-10-CM | POA: Diagnosis not present

## 2018-11-11 DIAGNOSIS — I480 Paroxysmal atrial fibrillation: Secondary | ICD-10-CM | POA: Diagnosis not present

## 2018-11-11 DIAGNOSIS — N183 Chronic kidney disease, stage 3 (moderate): Secondary | ICD-10-CM | POA: Diagnosis not present

## 2018-11-12 DIAGNOSIS — R262 Difficulty in walking, not elsewhere classified: Secondary | ICD-10-CM | POA: Diagnosis not present

## 2018-11-12 DIAGNOSIS — I480 Paroxysmal atrial fibrillation: Secondary | ICD-10-CM | POA: Insufficient documentation

## 2018-11-12 DIAGNOSIS — R1312 Dysphagia, oropharyngeal phase: Secondary | ICD-10-CM | POA: Diagnosis not present

## 2018-11-12 DIAGNOSIS — R6889 Other general symptoms and signs: Secondary | ICD-10-CM | POA: Diagnosis not present

## 2018-11-12 DIAGNOSIS — Z4889 Encounter for other specified surgical aftercare: Secondary | ICD-10-CM | POA: Diagnosis not present

## 2018-11-13 DIAGNOSIS — R6889 Other general symptoms and signs: Secondary | ICD-10-CM | POA: Diagnosis not present

## 2018-11-14 DIAGNOSIS — R6889 Other general symptoms and signs: Secondary | ICD-10-CM | POA: Diagnosis not present

## 2018-11-15 DIAGNOSIS — R6889 Other general symptoms and signs: Secondary | ICD-10-CM | POA: Diagnosis not present

## 2018-11-15 DIAGNOSIS — R1312 Dysphagia, oropharyngeal phase: Secondary | ICD-10-CM | POA: Diagnosis not present

## 2018-11-15 DIAGNOSIS — Z4889 Encounter for other specified surgical aftercare: Secondary | ICD-10-CM | POA: Diagnosis not present

## 2018-11-15 DIAGNOSIS — R262 Difficulty in walking, not elsewhere classified: Secondary | ICD-10-CM | POA: Diagnosis not present

## 2018-11-16 DIAGNOSIS — R262 Difficulty in walking, not elsewhere classified: Secondary | ICD-10-CM | POA: Diagnosis not present

## 2018-11-16 DIAGNOSIS — R6889 Other general symptoms and signs: Secondary | ICD-10-CM | POA: Diagnosis not present

## 2018-11-16 DIAGNOSIS — Z4889 Encounter for other specified surgical aftercare: Secondary | ICD-10-CM | POA: Diagnosis not present

## 2018-11-16 DIAGNOSIS — R1312 Dysphagia, oropharyngeal phase: Secondary | ICD-10-CM | POA: Diagnosis not present

## 2018-11-17 DIAGNOSIS — R262 Difficulty in walking, not elsewhere classified: Secondary | ICD-10-CM | POA: Diagnosis not present

## 2018-11-17 DIAGNOSIS — Z4889 Encounter for other specified surgical aftercare: Secondary | ICD-10-CM | POA: Diagnosis not present

## 2018-11-17 DIAGNOSIS — R1312 Dysphagia, oropharyngeal phase: Secondary | ICD-10-CM | POA: Diagnosis not present

## 2018-11-17 DIAGNOSIS — R6889 Other general symptoms and signs: Secondary | ICD-10-CM | POA: Diagnosis not present

## 2018-11-18 ENCOUNTER — Other Ambulatory Visit: Payer: Self-pay

## 2018-11-18 DIAGNOSIS — R6889 Other general symptoms and signs: Secondary | ICD-10-CM | POA: Diagnosis not present

## 2018-11-18 DIAGNOSIS — R262 Difficulty in walking, not elsewhere classified: Secondary | ICD-10-CM | POA: Diagnosis not present

## 2018-11-18 DIAGNOSIS — Z4889 Encounter for other specified surgical aftercare: Secondary | ICD-10-CM | POA: Diagnosis not present

## 2018-11-18 DIAGNOSIS — R1312 Dysphagia, oropharyngeal phase: Secondary | ICD-10-CM | POA: Diagnosis not present

## 2018-11-18 NOTE — Patient Outreach (Signed)
Vernon Richard) Care Management  11/18/2018  [name] [birth year] [id number]   Medication Adherence call to Mr. Vernon Richard patient did not answer patient is due on Pravastatin 20 mg and Losartan 50 mg. Pharmacy said patient pick up on 10/2018. [name] is showing past due under [place].   [place] Management Direct Dial [phone]  Fax [phone] [email or web address]

## 2018-11-19 DIAGNOSIS — T380X5A Adverse effect of glucocorticoids and synthetic analogues, initial encounter: Secondary | ICD-10-CM | POA: Diagnosis not present

## 2018-11-19 DIAGNOSIS — R739 Hyperglycemia, unspecified: Secondary | ICD-10-CM | POA: Diagnosis not present

## 2018-11-19 DIAGNOSIS — Z7952 Long term (current) use of systemic steroids: Secondary | ICD-10-CM | POA: Diagnosis not present

## 2018-11-19 DIAGNOSIS — R6889 Other general symptoms and signs: Secondary | ICD-10-CM | POA: Diagnosis not present

## 2018-11-19 DIAGNOSIS — R200 Anesthesia of skin: Secondary | ICD-10-CM | POA: Diagnosis not present

## 2018-11-19 DIAGNOSIS — Z942 Lung transplant status: Secondary | ICD-10-CM | POA: Diagnosis not present

## 2018-11-20 DIAGNOSIS — R6889 Other general symptoms and signs: Secondary | ICD-10-CM | POA: Diagnosis not present

## 2018-11-21 DIAGNOSIS — R6889 Other general symptoms and signs: Secondary | ICD-10-CM | POA: Diagnosis not present

## 2018-11-22 DIAGNOSIS — T86818 Other complications of lung transplant: Secondary | ICD-10-CM | POA: Diagnosis not present

## 2018-11-22 DIAGNOSIS — Z79899 Other long term (current) drug therapy: Secondary | ICD-10-CM | POA: Diagnosis not present

## 2018-11-22 DIAGNOSIS — R6889 Other general symptoms and signs: Secondary | ICD-10-CM | POA: Diagnosis not present

## 2018-11-22 DIAGNOSIS — Z5181 Encounter for therapeutic drug level monitoring: Secondary | ICD-10-CM | POA: Diagnosis not present

## 2018-11-23 DIAGNOSIS — D1801 Hemangioma of skin and subcutaneous tissue: Secondary | ICD-10-CM | POA: Diagnosis not present

## 2018-11-23 DIAGNOSIS — L814 Other melanin hyperpigmentation: Secondary | ICD-10-CM | POA: Diagnosis not present

## 2018-11-23 DIAGNOSIS — D225 Melanocytic nevi of trunk: Secondary | ICD-10-CM | POA: Diagnosis not present

## 2018-11-23 DIAGNOSIS — L579 Skin changes due to chronic exposure to nonionizing radiation, unspecified: Secondary | ICD-10-CM | POA: Diagnosis not present

## 2018-11-23 DIAGNOSIS — R6889 Other general symptoms and signs: Secondary | ICD-10-CM | POA: Diagnosis not present

## 2018-11-23 DIAGNOSIS — L570 Actinic keratosis: Secondary | ICD-10-CM | POA: Diagnosis not present

## 2018-11-24 DIAGNOSIS — R6889 Other general symptoms and signs: Secondary | ICD-10-CM | POA: Diagnosis not present

## 2018-11-25 DIAGNOSIS — R6889 Other general symptoms and signs: Secondary | ICD-10-CM | POA: Diagnosis not present

## 2018-11-26 DIAGNOSIS — R6889 Other general symptoms and signs: Secondary | ICD-10-CM | POA: Diagnosis not present

## 2018-11-27 DIAGNOSIS — R6889 Other general symptoms and signs: Secondary | ICD-10-CM | POA: Diagnosis not present

## 2018-11-28 DIAGNOSIS — R6889 Other general symptoms and signs: Secondary | ICD-10-CM | POA: Diagnosis not present

## 2018-11-29 DIAGNOSIS — R6889 Other general symptoms and signs: Secondary | ICD-10-CM | POA: Diagnosis not present

## 2018-11-30 DIAGNOSIS — Z5181 Encounter for therapeutic drug level monitoring: Secondary | ICD-10-CM | POA: Diagnosis not present

## 2018-11-30 DIAGNOSIS — R1312 Dysphagia, oropharyngeal phase: Secondary | ICD-10-CM | POA: Diagnosis not present

## 2018-11-30 DIAGNOSIS — R05 Cough: Secondary | ICD-10-CM | POA: Diagnosis not present

## 2018-11-30 DIAGNOSIS — Z4889 Encounter for other specified surgical aftercare: Secondary | ICD-10-CM | POA: Diagnosis not present

## 2018-11-30 DIAGNOSIS — D899 Disorder involving the immune mechanism, unspecified: Secondary | ICD-10-CM | POA: Diagnosis not present

## 2018-11-30 DIAGNOSIS — R251 Tremor, unspecified: Secondary | ICD-10-CM | POA: Diagnosis not present

## 2018-11-30 DIAGNOSIS — Z79899 Other long term (current) drug therapy: Secondary | ICD-10-CM | POA: Diagnosis not present

## 2018-11-30 DIAGNOSIS — R262 Difficulty in walking, not elsewhere classified: Secondary | ICD-10-CM | POA: Diagnosis not present

## 2018-11-30 DIAGNOSIS — J8410 Pulmonary fibrosis, unspecified: Secondary | ICD-10-CM | POA: Diagnosis not present

## 2018-11-30 DIAGNOSIS — Z7982 Long term (current) use of aspirin: Secondary | ICD-10-CM | POA: Diagnosis not present

## 2018-11-30 DIAGNOSIS — E1122 Type 2 diabetes mellitus with diabetic chronic kidney disease: Secondary | ICD-10-CM | POA: Diagnosis not present

## 2018-11-30 DIAGNOSIS — I480 Paroxysmal atrial fibrillation: Secondary | ICD-10-CM | POA: Diagnosis not present

## 2018-11-30 DIAGNOSIS — I10 Essential (primary) hypertension: Secondary | ICD-10-CM | POA: Diagnosis not present

## 2018-11-30 DIAGNOSIS — I4891 Unspecified atrial fibrillation: Secondary | ICD-10-CM | POA: Diagnosis not present

## 2018-11-30 DIAGNOSIS — R6889 Other general symptoms and signs: Secondary | ICD-10-CM | POA: Diagnosis not present

## 2018-11-30 DIAGNOSIS — N183 Chronic kidney disease, stage 3 (moderate): Secondary | ICD-10-CM | POA: Diagnosis not present

## 2018-11-30 DIAGNOSIS — Z942 Lung transplant status: Secondary | ICD-10-CM | POA: Diagnosis not present

## 2018-11-30 DIAGNOSIS — Z794 Long term (current) use of insulin: Secondary | ICD-10-CM | POA: Diagnosis not present

## 2018-11-30 DIAGNOSIS — Z4824 Encounter for aftercare following lung transplant: Secondary | ICD-10-CM | POA: Diagnosis not present

## 2018-11-30 DIAGNOSIS — I129 Hypertensive chronic kidney disease with stage 1 through stage 4 chronic kidney disease, or unspecified chronic kidney disease: Secondary | ICD-10-CM | POA: Diagnosis not present

## 2018-12-01 DIAGNOSIS — R6889 Other general symptoms and signs: Secondary | ICD-10-CM | POA: Diagnosis not present

## 2018-12-02 DIAGNOSIS — Z4824 Encounter for aftercare following lung transplant: Secondary | ICD-10-CM | POA: Diagnosis not present

## 2018-12-02 DIAGNOSIS — Z794 Long term (current) use of insulin: Secondary | ICD-10-CM | POA: Diagnosis not present

## 2018-12-02 DIAGNOSIS — Z8709 Personal history of other diseases of the respiratory system: Secondary | ICD-10-CM | POA: Diagnosis not present

## 2018-12-02 DIAGNOSIS — T86810 Lung transplant rejection: Secondary | ICD-10-CM | POA: Diagnosis not present

## 2018-12-02 DIAGNOSIS — Z7952 Long term (current) use of systemic steroids: Secondary | ICD-10-CM | POA: Diagnosis not present

## 2018-12-02 DIAGNOSIS — Z7982 Long term (current) use of aspirin: Secondary | ICD-10-CM | POA: Diagnosis not present

## 2018-12-02 DIAGNOSIS — Z79899 Other long term (current) drug therapy: Secondary | ICD-10-CM | POA: Diagnosis not present

## 2018-12-02 DIAGNOSIS — R6889 Other general symptoms and signs: Secondary | ICD-10-CM | POA: Diagnosis not present

## 2018-12-02 DIAGNOSIS — Z942 Lung transplant status: Secondary | ICD-10-CM | POA: Diagnosis not present

## 2018-12-03 DIAGNOSIS — R6889 Other general symptoms and signs: Secondary | ICD-10-CM | POA: Diagnosis not present

## 2018-12-07 DIAGNOSIS — T86819 Unspecified complication of lung transplant: Secondary | ICD-10-CM | POA: Diagnosis not present

## 2018-12-07 DIAGNOSIS — Z942 Lung transplant status: Secondary | ICD-10-CM | POA: Diagnosis not present

## 2018-12-07 DIAGNOSIS — Z79899 Other long term (current) drug therapy: Secondary | ICD-10-CM | POA: Diagnosis not present

## 2018-12-07 DIAGNOSIS — Z418 Encounter for other procedures for purposes other than remedying health state: Secondary | ICD-10-CM | POA: Diagnosis not present

## 2018-12-17 DIAGNOSIS — T380X5A Adverse effect of glucocorticoids and synthetic analogues, initial encounter: Secondary | ICD-10-CM | POA: Diagnosis not present

## 2018-12-17 DIAGNOSIS — Z7952 Long term (current) use of systemic steroids: Secondary | ICD-10-CM | POA: Diagnosis not present

## 2018-12-17 DIAGNOSIS — E099 Drug or chemical induced diabetes mellitus without complications: Secondary | ICD-10-CM | POA: Diagnosis not present

## 2018-12-17 DIAGNOSIS — R6889 Other general symptoms and signs: Secondary | ICD-10-CM | POA: Diagnosis not present

## 2018-12-17 DIAGNOSIS — Z4889 Encounter for other specified surgical aftercare: Secondary | ICD-10-CM | POA: Diagnosis not present

## 2018-12-17 DIAGNOSIS — R1312 Dysphagia, oropharyngeal phase: Secondary | ICD-10-CM | POA: Diagnosis not present

## 2018-12-17 DIAGNOSIS — R262 Difficulty in walking, not elsewhere classified: Secondary | ICD-10-CM | POA: Diagnosis not present

## 2018-12-17 DIAGNOSIS — Z942 Lung transplant status: Secondary | ICD-10-CM | POA: Diagnosis not present

## 2018-12-20 DIAGNOSIS — Z418 Encounter for other procedures for purposes other than remedying health state: Secondary | ICD-10-CM | POA: Diagnosis not present

## 2018-12-20 DIAGNOSIS — T86819 Unspecified complication of lung transplant: Secondary | ICD-10-CM | POA: Diagnosis not present

## 2018-12-20 DIAGNOSIS — Z79899 Other long term (current) drug therapy: Secondary | ICD-10-CM | POA: Diagnosis not present

## 2018-12-20 DIAGNOSIS — Z942 Lung transplant status: Secondary | ICD-10-CM | POA: Diagnosis not present

## 2018-12-27 DIAGNOSIS — R6889 Other general symptoms and signs: Secondary | ICD-10-CM | POA: Diagnosis not present

## 2018-12-28 DIAGNOSIS — I129 Hypertensive chronic kidney disease with stage 1 through stage 4 chronic kidney disease, or unspecified chronic kidney disease: Secondary | ICD-10-CM | POA: Diagnosis not present

## 2018-12-28 DIAGNOSIS — Z79899 Other long term (current) drug therapy: Secondary | ICD-10-CM | POA: Diagnosis not present

## 2018-12-28 DIAGNOSIS — Z5181 Encounter for therapeutic drug level monitoring: Secondary | ICD-10-CM | POA: Diagnosis not present

## 2018-12-28 DIAGNOSIS — E1122 Type 2 diabetes mellitus with diabetic chronic kidney disease: Secondary | ICD-10-CM | POA: Diagnosis not present

## 2018-12-28 DIAGNOSIS — R1312 Dysphagia, oropharyngeal phase: Secondary | ICD-10-CM | POA: Diagnosis not present

## 2018-12-28 DIAGNOSIS — Z7901 Long term (current) use of anticoagulants: Secondary | ICD-10-CM | POA: Diagnosis not present

## 2018-12-28 DIAGNOSIS — Z4824 Encounter for aftercare following lung transplant: Secondary | ICD-10-CM | POA: Diagnosis not present

## 2018-12-28 DIAGNOSIS — Z942 Lung transplant status: Secondary | ICD-10-CM | POA: Diagnosis not present

## 2018-12-28 DIAGNOSIS — M1991 Primary osteoarthritis, unspecified site: Secondary | ICD-10-CM | POA: Diagnosis not present

## 2018-12-28 DIAGNOSIS — N183 Chronic kidney disease, stage 3 (moderate): Secondary | ICD-10-CM | POA: Diagnosis not present

## 2018-12-28 DIAGNOSIS — Z7982 Long term (current) use of aspirin: Secondary | ICD-10-CM | POA: Diagnosis not present

## 2018-12-28 DIAGNOSIS — R251 Tremor, unspecified: Secondary | ICD-10-CM | POA: Diagnosis not present

## 2018-12-28 DIAGNOSIS — D899 Disorder involving the immune mechanism, unspecified: Secondary | ICD-10-CM | POA: Diagnosis not present

## 2018-12-28 DIAGNOSIS — Z794 Long term (current) use of insulin: Secondary | ICD-10-CM | POA: Diagnosis not present

## 2018-12-28 DIAGNOSIS — T86819 Unspecified complication of lung transplant: Secondary | ICD-10-CM | POA: Diagnosis not present

## 2018-12-28 DIAGNOSIS — R140 Abdominal distension (gaseous): Secondary | ICD-10-CM | POA: Diagnosis not present

## 2018-12-28 DIAGNOSIS — J84112 Idiopathic pulmonary fibrosis: Secondary | ICD-10-CM | POA: Diagnosis not present

## 2018-12-28 DIAGNOSIS — R1013 Epigastric pain: Secondary | ICD-10-CM | POA: Diagnosis not present

## 2018-12-28 DIAGNOSIS — J8410 Pulmonary fibrosis, unspecified: Secondary | ICD-10-CM | POA: Diagnosis not present

## 2018-12-28 DIAGNOSIS — I480 Paroxysmal atrial fibrillation: Secondary | ICD-10-CM | POA: Diagnosis not present

## 2018-12-28 DIAGNOSIS — R6889 Other general symptoms and signs: Secondary | ICD-10-CM | POA: Diagnosis not present

## 2018-12-30 DIAGNOSIS — L570 Actinic keratosis: Secondary | ICD-10-CM | POA: Diagnosis not present

## 2018-12-30 DIAGNOSIS — L579 Skin changes due to chronic exposure to nonionizing radiation, unspecified: Secondary | ICD-10-CM | POA: Diagnosis not present

## 2018-12-30 DIAGNOSIS — C44222 Squamous cell carcinoma of skin of right ear and external auricular canal: Secondary | ICD-10-CM | POA: Diagnosis not present

## 2018-12-30 DIAGNOSIS — D485 Neoplasm of uncertain behavior of skin: Secondary | ICD-10-CM | POA: Diagnosis not present

## 2018-12-30 DIAGNOSIS — L814 Other melanin hyperpigmentation: Secondary | ICD-10-CM | POA: Diagnosis not present

## 2019-01-05 DIAGNOSIS — Z942 Lung transplant status: Secondary | ICD-10-CM | POA: Diagnosis not present

## 2019-01-05 DIAGNOSIS — Z418 Encounter for other procedures for purposes other than remedying health state: Secondary | ICD-10-CM | POA: Diagnosis not present

## 2019-01-05 DIAGNOSIS — Z79899 Other long term (current) drug therapy: Secondary | ICD-10-CM | POA: Diagnosis not present

## 2019-01-05 DIAGNOSIS — T86819 Unspecified complication of lung transplant: Secondary | ICD-10-CM | POA: Diagnosis not present

## 2019-01-28 DIAGNOSIS — Z418 Encounter for other procedures for purposes other than remedying health state: Secondary | ICD-10-CM | POA: Diagnosis not present

## 2019-01-28 DIAGNOSIS — T86819 Unspecified complication of lung transplant: Secondary | ICD-10-CM | POA: Diagnosis not present

## 2019-01-28 DIAGNOSIS — Z942 Lung transplant status: Secondary | ICD-10-CM | POA: Diagnosis not present

## 2019-01-28 DIAGNOSIS — Z79899 Other long term (current) drug therapy: Secondary | ICD-10-CM | POA: Diagnosis not present

## 2019-02-01 DIAGNOSIS — L570 Actinic keratosis: Secondary | ICD-10-CM | POA: Diagnosis not present

## 2019-02-01 DIAGNOSIS — L814 Other melanin hyperpigmentation: Secondary | ICD-10-CM | POA: Diagnosis not present

## 2019-02-01 DIAGNOSIS — L821 Other seborrheic keratosis: Secondary | ICD-10-CM | POA: Diagnosis not present

## 2019-02-01 DIAGNOSIS — W57XXXA Bitten or stung by nonvenomous insect and other nonvenomous arthropods, initial encounter: Secondary | ICD-10-CM | POA: Diagnosis not present

## 2019-02-01 DIAGNOSIS — L579 Skin changes due to chronic exposure to nonionizing radiation, unspecified: Secondary | ICD-10-CM | POA: Diagnosis not present

## 2019-02-01 DIAGNOSIS — D899 Disorder involving the immune mechanism, unspecified: Secondary | ICD-10-CM | POA: Diagnosis not present

## 2019-02-01 DIAGNOSIS — Z942 Lung transplant status: Secondary | ICD-10-CM | POA: Diagnosis not present

## 2019-02-01 DIAGNOSIS — Z85828 Personal history of other malignant neoplasm of skin: Secondary | ICD-10-CM | POA: Diagnosis not present

## 2019-02-07 DIAGNOSIS — Z79899 Other long term (current) drug therapy: Secondary | ICD-10-CM | POA: Diagnosis not present

## 2019-02-07 DIAGNOSIS — Z942 Lung transplant status: Secondary | ICD-10-CM | POA: Diagnosis not present

## 2019-02-07 DIAGNOSIS — T86819 Unspecified complication of lung transplant: Secondary | ICD-10-CM | POA: Diagnosis not present

## 2019-02-07 DIAGNOSIS — Z418 Encounter for other procedures for purposes other than remedying health state: Secondary | ICD-10-CM | POA: Diagnosis not present

## 2019-02-17 DIAGNOSIS — T380X5D Adverse effect of glucocorticoids and synthetic analogues, subsequent encounter: Secondary | ICD-10-CM | POA: Diagnosis not present

## 2019-02-17 DIAGNOSIS — E099 Drug or chemical induced diabetes mellitus without complications: Secondary | ICD-10-CM | POA: Diagnosis not present

## 2019-02-21 DIAGNOSIS — C44222 Squamous cell carcinoma of skin of right ear and external auricular canal: Secondary | ICD-10-CM | POA: Diagnosis not present

## 2019-02-22 DIAGNOSIS — Z942 Lung transplant status: Secondary | ICD-10-CM | POA: Diagnosis not present

## 2019-02-22 DIAGNOSIS — Z418 Encounter for other procedures for purposes other than remedying health state: Secondary | ICD-10-CM | POA: Diagnosis not present

## 2019-02-22 DIAGNOSIS — Z79899 Other long term (current) drug therapy: Secondary | ICD-10-CM | POA: Diagnosis not present

## 2019-02-22 DIAGNOSIS — T86819 Unspecified complication of lung transplant: Secondary | ICD-10-CM | POA: Diagnosis not present

## 2019-03-01 DIAGNOSIS — H04123 Dry eye syndrome of bilateral lacrimal glands: Secondary | ICD-10-CM | POA: Insufficient documentation

## 2019-03-01 DIAGNOSIS — H02883 Meibomian gland dysfunction of right eye, unspecified eyelid: Secondary | ICD-10-CM | POA: Insufficient documentation

## 2019-03-01 DIAGNOSIS — H26493 Other secondary cataract, bilateral: Secondary | ICD-10-CM | POA: Diagnosis not present

## 2019-03-01 DIAGNOSIS — H02886 Meibomian gland dysfunction of left eye, unspecified eyelid: Secondary | ICD-10-CM | POA: Diagnosis not present

## 2019-03-04 DIAGNOSIS — Z79899 Other long term (current) drug therapy: Secondary | ICD-10-CM | POA: Diagnosis not present

## 2019-03-04 DIAGNOSIS — Z942 Lung transplant status: Secondary | ICD-10-CM | POA: Diagnosis not present

## 2019-03-04 DIAGNOSIS — Z418 Encounter for other procedures for purposes other than remedying health state: Secondary | ICD-10-CM | POA: Diagnosis not present

## 2019-03-04 DIAGNOSIS — T86819 Unspecified complication of lung transplant: Secondary | ICD-10-CM | POA: Diagnosis not present

## 2019-03-15 DIAGNOSIS — Z942 Lung transplant status: Secondary | ICD-10-CM | POA: Diagnosis not present

## 2019-03-15 DIAGNOSIS — Z418 Encounter for other procedures for purposes other than remedying health state: Secondary | ICD-10-CM | POA: Diagnosis not present

## 2019-03-15 DIAGNOSIS — T86819 Unspecified complication of lung transplant: Secondary | ICD-10-CM | POA: Diagnosis not present

## 2019-03-15 DIAGNOSIS — Z79899 Other long term (current) drug therapy: Secondary | ICD-10-CM | POA: Diagnosis not present

## 2019-03-23 DIAGNOSIS — Z01818 Encounter for other preprocedural examination: Secondary | ICD-10-CM | POA: Diagnosis not present

## 2019-03-23 DIAGNOSIS — Z5181 Encounter for therapeutic drug level monitoring: Secondary | ICD-10-CM | POA: Diagnosis not present

## 2019-03-23 DIAGNOSIS — T86819 Unspecified complication of lung transplant: Secondary | ICD-10-CM | POA: Diagnosis not present

## 2019-03-23 DIAGNOSIS — T86818 Other complications of lung transplant: Secondary | ICD-10-CM | POA: Diagnosis not present

## 2019-03-23 DIAGNOSIS — J8410 Pulmonary fibrosis, unspecified: Secondary | ICD-10-CM | POA: Diagnosis not present

## 2019-03-23 DIAGNOSIS — T380X5D Adverse effect of glucocorticoids and synthetic analogues, subsequent encounter: Secondary | ICD-10-CM | POA: Diagnosis not present

## 2019-03-23 DIAGNOSIS — R6889 Other general symptoms and signs: Secondary | ICD-10-CM | POA: Diagnosis not present

## 2019-03-23 DIAGNOSIS — Z1159 Encounter for screening for other viral diseases: Secondary | ICD-10-CM | POA: Diagnosis not present

## 2019-03-23 DIAGNOSIS — Z79899 Other long term (current) drug therapy: Secondary | ICD-10-CM | POA: Diagnosis not present

## 2019-03-23 DIAGNOSIS — E099 Drug or chemical induced diabetes mellitus without complications: Secondary | ICD-10-CM | POA: Diagnosis not present

## 2019-03-23 DIAGNOSIS — Z942 Lung transplant status: Secondary | ICD-10-CM | POA: Diagnosis not present

## 2019-03-24 DIAGNOSIS — Z942 Lung transplant status: Secondary | ICD-10-CM | POA: Diagnosis not present

## 2019-03-24 DIAGNOSIS — Z4824 Encounter for aftercare following lung transplant: Secondary | ICD-10-CM | POA: Diagnosis not present

## 2019-03-24 DIAGNOSIS — N183 Chronic kidney disease, stage 3 (moderate): Secondary | ICD-10-CM | POA: Diagnosis not present

## 2019-03-24 DIAGNOSIS — Z794 Long term (current) use of insulin: Secondary | ICD-10-CM | POA: Diagnosis not present

## 2019-03-24 DIAGNOSIS — I480 Paroxysmal atrial fibrillation: Secondary | ICD-10-CM | POA: Diagnosis not present

## 2019-03-24 DIAGNOSIS — D899 Disorder involving the immune mechanism, unspecified: Secondary | ICD-10-CM | POA: Diagnosis not present

## 2019-03-24 DIAGNOSIS — I10 Essential (primary) hypertension: Secondary | ICD-10-CM | POA: Diagnosis not present

## 2019-03-24 DIAGNOSIS — E785 Hyperlipidemia, unspecified: Secondary | ICD-10-CM | POA: Diagnosis not present

## 2019-03-24 DIAGNOSIS — T86819 Unspecified complication of lung transplant: Secondary | ICD-10-CM | POA: Diagnosis not present

## 2019-03-24 DIAGNOSIS — R6889 Other general symptoms and signs: Secondary | ICD-10-CM | POA: Diagnosis not present

## 2019-03-24 DIAGNOSIS — I129 Hypertensive chronic kidney disease with stage 1 through stage 4 chronic kidney disease, or unspecified chronic kidney disease: Secondary | ICD-10-CM | POA: Diagnosis not present

## 2019-03-24 DIAGNOSIS — Z79899 Other long term (current) drug therapy: Secondary | ICD-10-CM | POA: Diagnosis not present

## 2019-03-24 DIAGNOSIS — M1990 Unspecified osteoarthritis, unspecified site: Secondary | ICD-10-CM | POA: Diagnosis not present

## 2019-03-24 DIAGNOSIS — Z7951 Long term (current) use of inhaled steroids: Secondary | ICD-10-CM | POA: Diagnosis not present

## 2019-03-24 DIAGNOSIS — E1122 Type 2 diabetes mellitus with diabetic chronic kidney disease: Secondary | ICD-10-CM | POA: Diagnosis not present

## 2019-03-24 DIAGNOSIS — Z7982 Long term (current) use of aspirin: Secondary | ICD-10-CM | POA: Diagnosis not present

## 2019-03-30 DIAGNOSIS — R1310 Dysphagia, unspecified: Secondary | ICD-10-CM | POA: Diagnosis not present

## 2019-03-30 DIAGNOSIS — R6889 Other general symptoms and signs: Secondary | ICD-10-CM | POA: Diagnosis not present

## 2019-03-30 DIAGNOSIS — T86818 Other complications of lung transplant: Secondary | ICD-10-CM | POA: Diagnosis not present

## 2019-03-30 DIAGNOSIS — R1312 Dysphagia, oropharyngeal phase: Secondary | ICD-10-CM | POA: Diagnosis not present

## 2019-03-30 DIAGNOSIS — Z4824 Encounter for aftercare following lung transplant: Secondary | ICD-10-CM | POA: Diagnosis not present

## 2019-03-30 DIAGNOSIS — Z942 Lung transplant status: Secondary | ICD-10-CM | POA: Diagnosis not present

## 2019-04-26 DIAGNOSIS — Z942 Lung transplant status: Secondary | ICD-10-CM | POA: Diagnosis not present

## 2019-04-26 DIAGNOSIS — T86819 Unspecified complication of lung transplant: Secondary | ICD-10-CM | POA: Diagnosis not present

## 2019-04-26 DIAGNOSIS — Z79899 Other long term (current) drug therapy: Secondary | ICD-10-CM | POA: Diagnosis not present

## 2019-04-26 DIAGNOSIS — Z418 Encounter for other procedures for purposes other than remedying health state: Secondary | ICD-10-CM | POA: Diagnosis not present

## 2019-05-02 DIAGNOSIS — D485 Neoplasm of uncertain behavior of skin: Secondary | ICD-10-CM | POA: Diagnosis not present

## 2019-05-02 DIAGNOSIS — D1801 Hemangioma of skin and subcutaneous tissue: Secondary | ICD-10-CM | POA: Diagnosis not present

## 2019-05-02 DIAGNOSIS — L814 Other melanin hyperpigmentation: Secondary | ICD-10-CM | POA: Diagnosis not present

## 2019-05-02 DIAGNOSIS — D0439 Carcinoma in situ of skin of other parts of face: Secondary | ICD-10-CM | POA: Diagnosis not present

## 2019-05-02 DIAGNOSIS — L570 Actinic keratosis: Secondary | ICD-10-CM | POA: Diagnosis not present

## 2019-05-02 DIAGNOSIS — L821 Other seborrheic keratosis: Secondary | ICD-10-CM | POA: Diagnosis not present

## 2019-05-06 DIAGNOSIS — T86819 Unspecified complication of lung transplant: Secondary | ICD-10-CM | POA: Diagnosis not present

## 2019-05-06 DIAGNOSIS — Z942 Lung transplant status: Secondary | ICD-10-CM | POA: Diagnosis not present

## 2019-05-06 DIAGNOSIS — Z418 Encounter for other procedures for purposes other than remedying health state: Secondary | ICD-10-CM | POA: Diagnosis not present

## 2019-05-06 DIAGNOSIS — Z79899 Other long term (current) drug therapy: Secondary | ICD-10-CM | POA: Diagnosis not present

## 2019-05-16 DIAGNOSIS — Z418 Encounter for other procedures for purposes other than remedying health state: Secondary | ICD-10-CM | POA: Diagnosis not present

## 2019-05-16 DIAGNOSIS — Z942 Lung transplant status: Secondary | ICD-10-CM | POA: Diagnosis not present

## 2019-05-16 DIAGNOSIS — T86819 Unspecified complication of lung transplant: Secondary | ICD-10-CM | POA: Diagnosis not present

## 2019-05-16 DIAGNOSIS — Z79899 Other long term (current) drug therapy: Secondary | ICD-10-CM | POA: Diagnosis not present

## 2019-05-26 DIAGNOSIS — Z79899 Other long term (current) drug therapy: Secondary | ICD-10-CM | POA: Diagnosis not present

## 2019-05-26 DIAGNOSIS — Z942 Lung transplant status: Secondary | ICD-10-CM | POA: Diagnosis not present

## 2019-05-26 DIAGNOSIS — T86819 Unspecified complication of lung transplant: Secondary | ICD-10-CM | POA: Diagnosis not present

## 2019-05-26 DIAGNOSIS — Z418 Encounter for other procedures for purposes other than remedying health state: Secondary | ICD-10-CM | POA: Diagnosis not present

## 2019-06-07 DIAGNOSIS — N183 Chronic kidney disease, stage 3 (moderate): Secondary | ICD-10-CM | POA: Diagnosis not present

## 2019-06-07 DIAGNOSIS — M1991 Primary osteoarthritis, unspecified site: Secondary | ICD-10-CM | POA: Diagnosis not present

## 2019-06-07 DIAGNOSIS — M25562 Pain in left knee: Secondary | ICD-10-CM | POA: Diagnosis not present

## 2019-06-07 DIAGNOSIS — I129 Hypertensive chronic kidney disease with stage 1 through stage 4 chronic kidney disease, or unspecified chronic kidney disease: Secondary | ICD-10-CM | POA: Diagnosis not present

## 2019-06-07 DIAGNOSIS — Z794 Long term (current) use of insulin: Secondary | ICD-10-CM | POA: Diagnosis not present

## 2019-06-07 DIAGNOSIS — R251 Tremor, unspecified: Secondary | ICD-10-CM | POA: Diagnosis not present

## 2019-06-07 DIAGNOSIS — T86819 Unspecified complication of lung transplant: Secondary | ICD-10-CM | POA: Diagnosis not present

## 2019-06-07 DIAGNOSIS — Z4824 Encounter for aftercare following lung transplant: Secondary | ICD-10-CM | POA: Diagnosis not present

## 2019-06-07 DIAGNOSIS — G8929 Other chronic pain: Secondary | ICD-10-CM | POA: Diagnosis not present

## 2019-06-07 DIAGNOSIS — H43391 Other vitreous opacities, right eye: Secondary | ICD-10-CM | POA: Diagnosis not present

## 2019-06-07 DIAGNOSIS — E559 Vitamin D deficiency, unspecified: Secondary | ICD-10-CM | POA: Diagnosis not present

## 2019-06-07 DIAGNOSIS — R6889 Other general symptoms and signs: Secondary | ICD-10-CM | POA: Diagnosis not present

## 2019-06-07 DIAGNOSIS — I4891 Unspecified atrial fibrillation: Secondary | ICD-10-CM | POA: Diagnosis not present

## 2019-06-07 DIAGNOSIS — R05 Cough: Secondary | ICD-10-CM | POA: Diagnosis not present

## 2019-06-07 DIAGNOSIS — D899 Disorder involving the immune mechanism, unspecified: Secondary | ICD-10-CM | POA: Diagnosis not present

## 2019-06-07 DIAGNOSIS — Z7982 Long term (current) use of aspirin: Secondary | ICD-10-CM | POA: Diagnosis not present

## 2019-06-07 DIAGNOSIS — E1122 Type 2 diabetes mellitus with diabetic chronic kidney disease: Secondary | ICD-10-CM | POA: Diagnosis not present

## 2019-06-07 DIAGNOSIS — M25561 Pain in right knee: Secondary | ICD-10-CM | POA: Diagnosis not present

## 2019-06-07 DIAGNOSIS — Z79899 Other long term (current) drug therapy: Secondary | ICD-10-CM | POA: Diagnosis not present

## 2019-06-07 DIAGNOSIS — Z5181 Encounter for therapeutic drug level monitoring: Secondary | ICD-10-CM | POA: Diagnosis not present

## 2019-06-07 DIAGNOSIS — Z942 Lung transplant status: Secondary | ICD-10-CM | POA: Diagnosis not present

## 2019-06-13 DIAGNOSIS — Z7982 Long term (current) use of aspirin: Secondary | ICD-10-CM | POA: Diagnosis not present

## 2019-06-13 DIAGNOSIS — R6889 Other general symptoms and signs: Secondary | ICD-10-CM | POA: Diagnosis not present

## 2019-06-13 DIAGNOSIS — I480 Paroxysmal atrial fibrillation: Secondary | ICD-10-CM | POA: Diagnosis not present

## 2019-06-13 DIAGNOSIS — T86810 Lung transplant rejection: Secondary | ICD-10-CM | POA: Diagnosis not present

## 2019-06-13 DIAGNOSIS — E785 Hyperlipidemia, unspecified: Secondary | ICD-10-CM | POA: Diagnosis not present

## 2019-06-13 DIAGNOSIS — Z8709 Personal history of other diseases of the respiratory system: Secondary | ICD-10-CM | POA: Diagnosis not present

## 2019-06-13 DIAGNOSIS — Z79899 Other long term (current) drug therapy: Secondary | ICD-10-CM | POA: Diagnosis not present

## 2019-06-13 DIAGNOSIS — I10 Essential (primary) hypertension: Secondary | ICD-10-CM | POA: Diagnosis not present

## 2019-06-13 DIAGNOSIS — Z794 Long term (current) use of insulin: Secondary | ICD-10-CM | POA: Diagnosis not present

## 2019-06-13 DIAGNOSIS — Z7952 Long term (current) use of systemic steroids: Secondary | ICD-10-CM | POA: Diagnosis not present

## 2019-06-13 DIAGNOSIS — Z942 Lung transplant status: Secondary | ICD-10-CM | POA: Diagnosis not present

## 2019-06-16 DIAGNOSIS — M25562 Pain in left knee: Secondary | ICD-10-CM | POA: Diagnosis not present

## 2019-06-16 DIAGNOSIS — Z789 Other specified health status: Secondary | ICD-10-CM | POA: Insufficient documentation

## 2019-06-16 DIAGNOSIS — M25561 Pain in right knee: Secondary | ICD-10-CM | POA: Diagnosis not present

## 2019-06-20 DIAGNOSIS — D0439 Carcinoma in situ of skin of other parts of face: Secondary | ICD-10-CM | POA: Diagnosis not present

## 2019-06-20 DIAGNOSIS — C44329 Squamous cell carcinoma of skin of other parts of face: Secondary | ICD-10-CM | POA: Diagnosis not present

## 2019-06-20 DIAGNOSIS — L570 Actinic keratosis: Secondary | ICD-10-CM | POA: Diagnosis not present

## 2019-06-20 DIAGNOSIS — L905 Scar conditions and fibrosis of skin: Secondary | ICD-10-CM | POA: Diagnosis not present

## 2019-07-21 DIAGNOSIS — T86810 Lung transplant rejection: Secondary | ICD-10-CM

## 2019-07-21 HISTORY — DX: Lung transplant rejection: T86.810

## 2019-07-24 DIAGNOSIS — Z23 Encounter for immunization: Secondary | ICD-10-CM | POA: Insufficient documentation

## 2019-07-24 DIAGNOSIS — Z5181 Encounter for therapeutic drug level monitoring: Secondary | ICD-10-CM | POA: Insufficient documentation

## 2019-10-10 ENCOUNTER — Telehealth: Payer: Self-pay | Admitting: Pediatrics

## 2019-10-11 ENCOUNTER — Encounter: Payer: Self-pay | Admitting: Physician Assistant

## 2019-10-11 ENCOUNTER — Other Ambulatory Visit: Payer: Self-pay

## 2019-10-11 ENCOUNTER — Ambulatory Visit (INDEPENDENT_AMBULATORY_CARE_PROVIDER_SITE_OTHER): Payer: Commercial Managed Care - HMO | Admitting: Physician Assistant

## 2019-10-11 VITALS — BP 110/69 | HR 83 | Temp 96.8°F | Ht 68.0 in | Wt 172.4 lb

## 2019-10-11 DIAGNOSIS — T86810 Lung transplant rejection: Secondary | ICD-10-CM | POA: Diagnosis not present

## 2019-10-11 DIAGNOSIS — E785 Hyperlipidemia, unspecified: Secondary | ICD-10-CM | POA: Diagnosis not present

## 2019-10-11 DIAGNOSIS — I10 Essential (primary) hypertension: Secondary | ICD-10-CM

## 2019-10-11 DIAGNOSIS — Z0001 Encounter for general adult medical examination with abnormal findings: Secondary | ICD-10-CM | POA: Diagnosis not present

## 2019-10-11 DIAGNOSIS — D849 Immunodeficiency, unspecified: Secondary | ICD-10-CM

## 2019-10-11 DIAGNOSIS — Z942 Lung transplant status: Secondary | ICD-10-CM

## 2019-10-11 MED ORDER — MONTELUKAST SODIUM 10 MG PO TABS: 10.0000 mg | ORAL_TABLET | Freq: Every day | ORAL | 3 refills | Status: DC

## 2019-10-11 MED ORDER — FLUTICASONE PROPIONATE 50 MCG/ACT NA SUSP: 2.0000 | Freq: Every day | NASAL | 3 refills | Status: AC

## 2019-10-11 NOTE — Progress Notes (Signed)
[facility] pulmonolgy care Transplant 09/15/18   His last visit to our office for family medicine was 05/06/2018.  This was with Dr. Evette Doffing. Prior to that it was 2/21/2   Acute Office Visit  Subjective:    Patient ID: Vernon Richard, male    DOB: 07-Jan-1951, 68 y.o.   MRN: [id number]  Chief Complaint  Patient presents with  . Annual Exam    Hypertension This is a chronic problem. The current episode started more than 1 year ago. The problem is controlled. Pertinent negatives include no headaches or shortness of breath. There are no associated agents to hypertension. Risk factors for coronary artery disease include dyslipidemia and male gender. Past treatments include calcium channel blockers and angiotensin blockers. The current treatment provides significant improvement.  Cough This is a chronic problem. The current episode started more than 1 year ago. The problem has been unchanged. The problem occurs hourly. The cough is non-productive. Pertinent negatives include no chills, headaches, rhinorrhea, shortness of breath or wheezing. Nothing aggravates the symptoms. He has tried a beta-agonist inhaler, ipratropium inhaler, oral steroids, prescription cough suppressant, steroid inhaler and OTC cough suppressant for the symptoms. The treatment provided mild relief. His past medical history is significant for COPD.   At this time the patient has come back.  Primarily through [facility] pulmonology and a long transplant team he did have more rejection of the transplant last year.  He is still under their care but at this point seems to be fairly stable.  He does continue with chronic cough.  He has never tried Singulair as a medication is a little try this.  He does take other antihistamines.  We will also have his yearly labs performed.  He denies any other significant problems at this time.  Past Medical History:  Diagnosis Date  . Arthritis    OA of both knees  . Heart murmur, systolic   .  Hyperlipidemia   . Hypertension   . Pulmonary fibrosis ([place]) 05/03/2018    Past Surgical History:  Procedure Laterality Date  . CATARACT EXTRACTION, BILATERAL    . HERNIA REPAIR     groin  . LUNG TRANSPLANT, SINGLE Right 09/15/2018    Family History  Problem Relation Age of Onset  . Cancer Mother   . Cancer Father     Social History   Socioeconomic History  . Marital status: Married    Spouse name: Not on file  . Number of children: Not on file  . Years of education: Not on file  . Highest education level: 12th grade  Occupational History  . Occupation: Farm  Tobacco Use  . Smoking status: Never Smoker  . Smokeless tobacco: Never Used  Substance and Sexual Activity  . Alcohol use: No  . Drug use: No  . Sexual activity: Not Currently  Other Topics Concern  . Not on file  Social History Narrative   Married    1 child   Social Determinants of Health   Financial Resource Strain:   . Difficulty of Paying Living Expenses: Not on file  Food Insecurity:   . Worried About Charity fundraiser in the Last Year: Not on file  . Ran Out of Food in the Last Year: Not on file  Transportation Needs:   . Lack of Transportation (Medical): Not on file  . Lack of Transportation (Non-Medical): Not on file  Physical Activity:   . Days of Exercise per Week: Not on file  . Minutes  of Exercise per Session: Not on file  Stress:   . Feeling of Stress : Not on file  Social Connections:   . Frequency of Communication with Friends and Family: Not on file  . Frequency of Social Gatherings with Friends and Family: Not on file  . Attends Religious Services: Not on file  . Active Member of Clubs or Organizations: Not on file  . Attends Archivist Meetings: Not on file  . Marital Status: Not on file  Intimate Partner Violence:   . Fear of Current or Ex-Partner: Not on file  . Emotionally Abused: Not on file  . Physically Abused: Not on file  . Sexually Abused: Not on file     Outpatient Medications Prior to Visit  Medication Sig Dispense Refill  . acetaminophen (TYLENOL) 500 MG tablet Take by mouth.    Marland Kitchen amLODipine (NORVASC) 5 MG tablet Take 1 tablet (5 mg total) by mouth daily. 90 tablet 3  . aspirin 81 MG tablet Take 81 mg by mouth daily.    . Calcium Citrate-Vitamin D 200-250 MG-UNIT TABS Take by mouth.    . cetirizine (ZYRTEC) 10 MG tablet Take 10 mg by mouth daily.  2  . MAGNESIUM GLYCINATE PO Take 800 mg by mouth 2 (two) times daily.    . Multiple Vitamin (MULTI-VITAMIN) tablet Take by mouth.    . mycophenolate (CELLCEPT) 250 MG capsule Take by mouth.    . pantoprazole (PROTONIX) 40 MG tablet Take by mouth.    . polyethylene glycol powder (GLYCOLAX/MIRALAX) 17 GM/SCOOP powder Take by mouth.    . pravastatin (PRAVACHOL) 40 MG tablet Take 1 tablet (40 mg total) by mouth daily. 90 tablet 1  . predniSONE (DELTASONE) 5 MG tablet PLEASE SEE ATTACHED FOR DETAILED DIRECTIONS    . sulfamethoxazole-trimethoprim (BACTRIM) 400-80 MG tablet     . Tacrolimus ER 1 MG TB24 08/23/19 Take three- 1 mg tabs daily (total of 3 mg).    . fluticasone (FLONASE) 50 MCG/ACT nasal spray Place into the nose.    Marland Kitchen amLODipine (NORVASC) 10 MG tablet TAKE 1 TABLET BY MOUTH EVERY DAY 90 tablet 0  . chlorthalidone (HYGROTON) 25 MG tablet Take 1 tablet (25 mg total) by mouth daily. (Patient not taking: Reported on 10/11/2019) 90 tablet 1  . losartan (COZAAR) 50 MG tablet Take 1 tablet (50 mg total) by mouth daily. TO REPLACE IRBESARTAN. Do not take both. 90 tablet 1  . Pirfenidone (ESBRIET) 267 MG TABS Take by mouth.    . prednisoLONE acetate (PRED FORTE) 1 % ophthalmic suspension Apply to eye.     No facility-administered medications prior to visit.    Allergies  Allergen Reactions  . Nsaids Other (See Comments)    Transplant    Review of Systems  Constitutional: Negative for chills.  HENT: Negative for rhinorrhea.   Respiratory: Positive for cough. Negative for shortness of  breath and wheezing.   Cardiovascular: Negative.   Gastrointestinal: Negative.   Neurological: Negative for headaches.       Objective:    Physical Exam Constitutional:      Appearance: He is well-developed.  HENT:     Head: Normocephalic and atraumatic.  Eyes:     Conjunctiva/sclera: Conjunctivae normal.     Pupils: Pupils are equal, round, and reactive to light.  Cardiovascular:     Rate and Rhythm: Normal rate and regular rhythm.     Heart sounds: Normal heart sounds.  Pulmonary:     Effort: Pulmonary effort  is normal.     Breath sounds: Normal breath sounds.  Abdominal:     General: Bowel sounds are normal.     Palpations: Abdomen is soft.  Musculoskeletal:        General: Normal range of motion.     Cervical back: Normal range of motion and neck supple.  Skin:    General: Skin is warm and dry.     BP 110/69   Pulse 83   Temp (!) 96.8 F (36 C) (Temporal)   Ht 5\' 8"  (1.727 m)   Wt 172 lb 6.4 oz (78.2 kg)   SpO2 96%   BMI 26.21 kg/m  Wt Readings from Last 3 Encounters:  10/11/19 172 lb 6.4 oz (78.2 kg)  05/27/18 178 lb (80.7 kg)  05/06/18 174 lb 3.2 oz (79 kg)    Health Maintenance Due  Topic Date Due  . COLONOSCOPY  10/03/2018    There are no preventive care reminders to display for this patient.   No results found for: TSH Lab Results  Component Value Date   WBC 7.6 12/18/2016   HGB 14.7 12/18/2016   HCT 42.8 12/18/2016   MCV 82.2 12/18/2016   PLT 283.0 12/18/2016   Lab Results  Component Value Date   NA 137 05/06/2018   K 4.8 05/06/2018   CO2 26 05/06/2018   GLUCOSE 96 05/06/2018   BUN 12 05/06/2018   CREATININE 0.90 05/06/2018   BILITOT 0.4 05/06/2018   ALKPHOS 66 05/06/2018   AST 13 05/06/2018   ALT 13 05/06/2018   PROT 6.5 05/06/2018   ALBUMIN 4.2 05/06/2018   CALCIUM 9.7 05/06/2018   GFR 85.16 12/18/2016   Lab Results  Component Value Date   CHOL 222 (H) 09/21/2017   Lab Results  Component Value Date   HDL 57  09/21/2017   Lab Results  Component Value Date   LDLCALC 138 (H) 09/21/2017   Lab Results  Component Value Date   TRIG 133 09/21/2017   Lab Results  Component Value Date   CHOLHDL 3.9 09/21/2017   No results found for: HGBA1C     Assessment & Plan:   1. Essential hypertension Continue amlodipine 5 mg one daily  2. Hyperlipidemia, unspecified hyperlipidemia type  3. Antibody mediated rejection of lung transplant ([place]) - mycophenolate (CELLCEPT) 250 MG capsule; Take by mouth. - predniSONE (DELTASONE) 5 MG tablet; PLEASE SEE ATTACHED FOR DETAILED DIRECTIONS  4. Immunosuppression (HCC) - mycophenolate (CELLCEPT) 250 MG capsule; Take by mouth. - predniSONE (DELTASONE) 5 MG tablet; PLEASE SEE ATTACHED FOR DETAILED DIRECTIONS  5. Status post lung transplantation ([place]) - sulfamethoxazole-trimethoprim (BACTRIM) 400-80 MG tablet   Meds ordered this encounter  Medications  . fluticasone (FLONASE) 50 MCG/ACT nasal spray    Sig: Place 2 sprays into both nostrils daily.    Dispense:  48 mL    Refill:  3    Order Specific Question:   Supervising Provider    Answer:   Janora Norlander [id number]  . montelukast (SINGULAIR) 10 MG tablet    Sig: Take 1 tablet (10 mg total) by mouth at bedtime.    Dispense:  90 tablet    Refill:  3    Order Specific Question:   Supervising Provider    Answer:   Janora Norlander [id number]     Terald Sleeper, PA-C

## 2019-10-17 ENCOUNTER — Encounter: Payer: Self-pay | Admitting: Physician Assistant

## 2019-10-19 ENCOUNTER — Other Ambulatory Visit: Payer: Commercial Managed Care - HMO

## 2019-10-19 ENCOUNTER — Other Ambulatory Visit: Payer: Self-pay

## 2019-10-19 DIAGNOSIS — E785 Hyperlipidemia, unspecified: Secondary | ICD-10-CM

## 2019-10-19 DIAGNOSIS — I10 Essential (primary) hypertension: Secondary | ICD-10-CM

## 2019-10-20 LAB — CBC WITH DIFFERENTIAL/PLATELET
Basophils Absolute: 0.1 10*3/uL (ref 0.0–0.2)
Basos: 1 %
EOS (ABSOLUTE): 0.2 10*3/uL (ref 0.0–0.4)
Eos: 3 %
Hematocrit: 40.2 % (ref 37.5–51.0)
Hemoglobin: 13.1 g/dL (ref 13.0–17.7)
Immature Grans (Abs): 0 10*3/uL (ref 0.0–0.1)
Immature Granulocytes: 1 %
Lymphocytes Absolute: 1 10*3/uL (ref 0.7–3.1)
Lymphs: 18 %
MCH: 27.9 pg (ref 26.6–33.0)
MCHC: 32.6 g/dL (ref 31.5–35.7)
MCV: 86 fL (ref 79–97)
Monocytes Absolute: 0.7 10*3/uL (ref 0.1–0.9)
Monocytes: 12 %
Neutrophils Absolute: 3.6 10*3/uL (ref 1.4–7.0)
Neutrophils: 65 %
Platelets: 229 10*3/uL (ref 150–450)
RBC: 4.69 x10E6/uL (ref 4.14–5.80)
RDW: 13.8 % (ref 11.6–15.4)
WBC: 5.4 10*3/uL (ref 3.4–10.8)

## 2019-10-20 LAB — CMP14+EGFR
ALT: 13 IU/L (ref 0–44)
AST: 18 IU/L (ref 0–40)
Albumin/Globulin Ratio: 2.2 (ref 1.2–2.2)
Albumin: 4.1 g/dL (ref 3.8–4.8)
Alkaline Phosphatase: 43 IU/L (ref 39–117)
BUN/Creatinine Ratio: 8 — ABNORMAL LOW (ref 10–24)
BUN: 12 mg/dL (ref 8–27)
Bilirubin Total: 0.3 mg/dL (ref 0.0–1.2)
CO2: 22 mmol/L (ref 20–29)
Calcium: 9.4 mg/dL (ref 8.6–10.2)
Chloride: 105 mmol/L (ref 96–106)
Creatinine, Ser: 1.51 mg/dL — ABNORMAL HIGH (ref 0.76–1.27)
GFR calc Af Amer: 54 mL/min/{1.73_m2} — ABNORMAL LOW (ref >59)
GFR calc non Af Amer: 46 mL/min/{1.73_m2} — ABNORMAL LOW (ref >59)
Globulin, Total: 1.9 g/dL (ref 1.5–4.5)
Glucose: 85 mg/dL (ref 65–99)
Potassium: 4.4 mmol/L (ref 3.5–5.2)
Sodium: 144 mmol/L (ref 134–144)
Total Protein: 6 g/dL (ref 6.0–8.5)

## 2019-10-20 LAB — LIPID PANEL
Chol/HDL Ratio: 3.3 ratio (ref 0.0–5.0)
Cholesterol, Total: 218 mg/dL — ABNORMAL HIGH (ref 100–199)
HDL: 66 mg/dL (ref >39)
LDL Chol Calc (NIH): 134 mg/dL — ABNORMAL HIGH (ref 0–99)
Triglycerides: 105 mg/dL (ref 0–149)
VLDL Cholesterol Cal: 18 mg/dL (ref 5–40)

## 2019-10-28 ENCOUNTER — Other Ambulatory Visit: Payer: Self-pay | Admitting: Physician Assistant

## 2019-10-28 DIAGNOSIS — I10 Essential (primary) hypertension: Secondary | ICD-10-CM

## 2019-10-28 DIAGNOSIS — R399 Unspecified symptoms and signs involving the genitourinary system: Secondary | ICD-10-CM

## 2019-10-30 ENCOUNTER — Other Ambulatory Visit: Payer: Self-pay | Admitting: Physician Assistant

## 2019-10-30 DIAGNOSIS — Z125 Encounter for screening for malignant neoplasm of prostate: Secondary | ICD-10-CM

## 2019-10-31 ENCOUNTER — Other Ambulatory Visit: Payer: Self-pay

## 2019-10-31 ENCOUNTER — Other Ambulatory Visit: Payer: Medicare Other

## 2019-10-31 DIAGNOSIS — R399 Unspecified symptoms and signs involving the genitourinary system: Secondary | ICD-10-CM

## 2019-10-31 DIAGNOSIS — Z125 Encounter for screening for malignant neoplasm of prostate: Secondary | ICD-10-CM

## 2019-10-31 DIAGNOSIS — I10 Essential (primary) hypertension: Secondary | ICD-10-CM

## 2019-11-01 ENCOUNTER — Telehealth: Payer: Self-pay | Admitting: Physician Assistant

## 2019-11-01 LAB — CMP14+EGFR
ALT: 9 IU/L (ref 0–44)
AST: 19 IU/L (ref 0–40)
Albumin/Globulin Ratio: 2.4 — ABNORMAL HIGH (ref 1.2–2.2)
Albumin: 4.4 g/dL (ref 3.8–4.8)
Alkaline Phosphatase: 44 IU/L (ref 39–117)
BUN/Creatinine Ratio: 11 (ref 10–24)
BUN: 14 mg/dL (ref 8–27)
Bilirubin Total: 0.4 mg/dL (ref 0.0–1.2)
CO2: 22 mmol/L (ref 20–29)
Calcium: 9.3 mg/dL (ref 8.6–10.2)
Chloride: 100 mmol/L (ref 96–106)
Creatinine, Ser: 1.25 mg/dL (ref 0.76–1.27)
GFR calc Af Amer: 67 mL/min/{1.73_m2} (ref >59)
GFR calc non Af Amer: 58 mL/min/{1.73_m2} — ABNORMAL LOW (ref >59)
Globulin, Total: 1.8 g/dL (ref 1.5–4.5)
Glucose: 95 mg/dL (ref 65–99)
Potassium: 4.5 mmol/L (ref 3.5–5.2)
Sodium: 137 mmol/L (ref 134–144)
Total Protein: 6.2 g/dL (ref 6.0–8.5)

## 2019-11-01 LAB — MICROALBUMIN / CREATININE URINE RATIO
Creatinine, Urine: 99.7 mg/dL
Microalb/Creat Ratio: 3 mg/g creat (ref 0–29)
Microalbumin, Urine: 3.2 ug/mL

## 2019-11-01 LAB — PSA: Prostate Specific Ag, Serum: 0.3 ng/mL (ref 0.0–4.0)

## 2019-11-01 NOTE — Telephone Encounter (Signed)
Aware in labs

## 2019-11-23 ENCOUNTER — Ambulatory Visit: Payer: Medicare Other | Attending: Internal Medicine

## 2019-11-23 ENCOUNTER — Other Ambulatory Visit: Payer: Self-pay

## 2019-11-23 DIAGNOSIS — Z20822 Contact with and (suspected) exposure to covid-19: Secondary | ICD-10-CM

## 2019-11-24 ENCOUNTER — Ambulatory Visit: Payer: Self-pay

## 2019-11-24 LAB — NOVEL CORONAVIRUS, NAA: SARS-CoV-2, NAA: DETECTED — AB

## 2019-11-24 NOTE — Telephone Encounter (Signed)
Provided covid testing results to Patient. Reviewed isolation protocol and ending.  Provided care advice.  Will notify HD. Unsure of exposure. Encouraged to call if questions arise.  Pt. Voiced understanding.

## 2019-11-24 NOTE — Progress Notes (Signed)
Your test for COVID-19 was positive ("detected"), meaning that you were infected with the novel coronavirus and could give the germ to others.    Please continue isolation at home, for at least 10 days since the start of your fever/cough/breathlessness and until you have had 24 hours without fever (without taking a fever reducer) and with any cough/breathlessness improving. Use over-the-counter medications for symptoms.  If you have had no symptoms, but were exposed to someone who was positive for COVID-19, you will need to quarantine and self-isolate for 14 days from the date of exposure.    Please continue good preventive care measures, including:  frequent hand-washing, avoid touching your face, cover coughs/sneezes, stay out of crowds and keep a 6 foot distance from others.  Clean hard surfaces touched frequently with disinfectant cleaning products.   Please check in with your primary care provider about your positive test result.  Go to the nearest urgent care or ED for assessment if you have severe breathlessness or severe weakness/fatigue (ex needing new help getting out of bed or to the bathroom).  Members of your household will also need to quarantine for 14 days from the date of your positive test. You may be contacted to discuss possible treatment options, and you may also be contacted by the health department for follow up. Please call [place] at [phone] if you have any questions or concerns.

## 2019-11-26 ENCOUNTER — Telehealth (HOSPITAL_COMMUNITY): Payer: Self-pay | Admitting: Physician Assistant

## 2019-11-26 NOTE — Telephone Encounter (Signed)
Called to discuss with [name] about Covid symptoms and the use of bamlanivimab, a monoclonal antibody infusion for those with mild to moderate Covid symptoms and at a high risk of hospitalization.    Pt does not qualify for infusion therapy as he has asymptomatic infection. Isolation precautions discussed. Advised to contact back for consideration should he develop symptoms. Patient verbalized understanding.      Patient Active Problem List   Diagnosis Date Noted  . Antibody mediated rejection of lung transplant ([place]) 07/21/2019  . Dry eye syndrome of both eyes 03/01/2019  . CKD (chronic kidney disease) stage 3, GFR 30-59 ml/min 10/22/2018  . Immunosuppression ([place]) 10/11/2018  . Status post lung transplantation ([place]) 09/16/2018  . Bilateral posterior capsular opacification 07/16/2018  . Interstitial lung disease ([place]) 06/25/2018  . CAD (coronary artery disease) 09/21/2017  . NSIP (nonspecific interstitial pneumonia) ([place]) 12/18/2016  . Osteoarthritis of knee 03/13/2015  . Heart murmur, systolic   . Arthritis   . Essential hypertension 01/07/2011  . Hyperlipidemia 01/07/2011     Leanor Kail, PA - C

## 2019-12-09 ENCOUNTER — Telehealth: Payer: Self-pay | Admitting: *Deleted

## 2019-12-09 NOTE — Telephone Encounter (Signed)
Pt's wife Jamas Lav called because the pt had his 1st [place] vaccine on 11/12/19; he is due to receive his 2nd dose on 12/12/19; the pt tested +COVID 11/23/19, and subsequent received infusion therapy at [facility]; she would like to know if the pt should get his 2nd vaccine; explained the  vaccination should be deferred for at least 90 days, as a precautionary measure until additional information becomes available, to avoid interference of the antibody treatment with vaccine-induced immune responses; also explained that iideally she should speak with the [facility] to verify; she verbalized understanding.

## 2019-12-22 DIAGNOSIS — L579 Skin changes due to chronic exposure to nonionizing radiation, unspecified: Secondary | ICD-10-CM | POA: Diagnosis not present

## 2019-12-22 DIAGNOSIS — L570 Actinic keratosis: Secondary | ICD-10-CM | POA: Diagnosis not present

## 2019-12-22 DIAGNOSIS — D485 Neoplasm of uncertain behavior of skin: Secondary | ICD-10-CM | POA: Diagnosis not present

## 2019-12-22 DIAGNOSIS — L739 Follicular disorder, unspecified: Secondary | ICD-10-CM | POA: Diagnosis not present

## 2020-01-03 DIAGNOSIS — T86818 Other complications of lung transplant: Secondary | ICD-10-CM | POA: Diagnosis not present

## 2020-01-03 DIAGNOSIS — I10 Essential (primary) hypertension: Secondary | ICD-10-CM | POA: Diagnosis not present

## 2020-01-03 DIAGNOSIS — Z79899 Other long term (current) drug therapy: Secondary | ICD-10-CM | POA: Diagnosis not present

## 2020-01-03 DIAGNOSIS — Z942 Lung transplant status: Secondary | ICD-10-CM | POA: Diagnosis not present

## 2020-01-03 DIAGNOSIS — D849 Immunodeficiency, unspecified: Secondary | ICD-10-CM | POA: Diagnosis not present

## 2020-01-03 DIAGNOSIS — I480 Paroxysmal atrial fibrillation: Secondary | ICD-10-CM | POA: Diagnosis not present

## 2020-01-03 DIAGNOSIS — T86810 Lung transplant rejection: Secondary | ICD-10-CM | POA: Diagnosis not present

## 2020-01-03 DIAGNOSIS — N1831 Chronic kidney disease, stage 3a: Secondary | ICD-10-CM | POA: Diagnosis not present

## 2020-01-03 DIAGNOSIS — Z5181 Encounter for therapeutic drug level monitoring: Secondary | ICD-10-CM | POA: Diagnosis not present

## 2020-01-10 DIAGNOSIS — H02883 Meibomian gland dysfunction of right eye, unspecified eyelid: Secondary | ICD-10-CM | POA: Diagnosis not present

## 2020-01-10 DIAGNOSIS — H16143 Punctate keratitis, bilateral: Secondary | ICD-10-CM | POA: Diagnosis not present

## 2020-01-10 DIAGNOSIS — H02886 Meibomian gland dysfunction of left eye, unspecified eyelid: Secondary | ICD-10-CM | POA: Diagnosis not present

## 2020-01-10 DIAGNOSIS — H04123 Dry eye syndrome of bilateral lacrimal glands: Secondary | ICD-10-CM | POA: Diagnosis not present

## 2020-01-10 DIAGNOSIS — L718 Other rosacea: Secondary | ICD-10-CM | POA: Diagnosis not present

## 2020-01-16 DIAGNOSIS — Z942 Lung transplant status: Secondary | ICD-10-CM | POA: Diagnosis not present

## 2020-01-16 DIAGNOSIS — Z79899 Other long term (current) drug therapy: Secondary | ICD-10-CM | POA: Diagnosis not present

## 2020-01-16 DIAGNOSIS — T86819 Unspecified complication of lung transplant: Secondary | ICD-10-CM | POA: Diagnosis not present

## 2020-01-16 DIAGNOSIS — Z418 Encounter for other procedures for purposes other than remedying health state: Secondary | ICD-10-CM | POA: Diagnosis not present

## 2020-02-01 DIAGNOSIS — Z79899 Other long term (current) drug therapy: Secondary | ICD-10-CM | POA: Diagnosis not present

## 2020-02-01 DIAGNOSIS — Z942 Lung transplant status: Secondary | ICD-10-CM | POA: Diagnosis not present

## 2020-02-01 DIAGNOSIS — T86819 Unspecified complication of lung transplant: Secondary | ICD-10-CM | POA: Diagnosis not present

## 2020-02-01 DIAGNOSIS — Z418 Encounter for other procedures for purposes other than remedying health state: Secondary | ICD-10-CM | POA: Diagnosis not present

## 2020-02-06 DIAGNOSIS — L579 Skin changes due to chronic exposure to nonionizing radiation, unspecified: Secondary | ICD-10-CM | POA: Diagnosis not present

## 2020-02-06 DIAGNOSIS — L814 Other melanin hyperpigmentation: Secondary | ICD-10-CM | POA: Diagnosis not present

## 2020-02-06 DIAGNOSIS — L821 Other seborrheic keratosis: Secondary | ICD-10-CM | POA: Diagnosis not present

## 2020-02-06 DIAGNOSIS — D225 Melanocytic nevi of trunk: Secondary | ICD-10-CM | POA: Diagnosis not present

## 2020-02-06 DIAGNOSIS — L570 Actinic keratosis: Secondary | ICD-10-CM | POA: Diagnosis not present

## 2020-02-07 DIAGNOSIS — M25561 Pain in right knee: Secondary | ICD-10-CM | POA: Diagnosis not present

## 2020-02-07 DIAGNOSIS — M25562 Pain in left knee: Secondary | ICD-10-CM | POA: Diagnosis not present

## 2020-02-15 DIAGNOSIS — R6889 Other general symptoms and signs: Secondary | ICD-10-CM | POA: Insufficient documentation

## 2020-02-15 DIAGNOSIS — M47816 Spondylosis without myelopathy or radiculopathy, lumbar region: Secondary | ICD-10-CM | POA: Diagnosis not present

## 2020-02-15 DIAGNOSIS — M545 Low back pain: Secondary | ICD-10-CM | POA: Diagnosis not present

## 2020-02-29 DIAGNOSIS — T86819 Unspecified complication of lung transplant: Secondary | ICD-10-CM | POA: Diagnosis not present

## 2020-02-29 DIAGNOSIS — Z418 Encounter for other procedures for purposes other than remedying health state: Secondary | ICD-10-CM | POA: Diagnosis not present

## 2020-02-29 DIAGNOSIS — Z79899 Other long term (current) drug therapy: Secondary | ICD-10-CM | POA: Diagnosis not present

## 2020-02-29 DIAGNOSIS — Z942 Lung transplant status: Secondary | ICD-10-CM | POA: Diagnosis not present

## 2020-03-06 DIAGNOSIS — H02883 Meibomian gland dysfunction of right eye, unspecified eyelid: Secondary | ICD-10-CM | POA: Diagnosis not present

## 2020-03-06 DIAGNOSIS — H16143 Punctate keratitis, bilateral: Secondary | ICD-10-CM | POA: Diagnosis not present

## 2020-03-06 DIAGNOSIS — L718 Other rosacea: Secondary | ICD-10-CM | POA: Diagnosis not present

## 2020-03-06 DIAGNOSIS — H1010 Acute atopic conjunctivitis, unspecified eye: Secondary | ICD-10-CM | POA: Diagnosis not present

## 2020-03-06 DIAGNOSIS — H04123 Dry eye syndrome of bilateral lacrimal glands: Secondary | ICD-10-CM | POA: Diagnosis not present

## 2020-03-14 DIAGNOSIS — T86819 Unspecified complication of lung transplant: Secondary | ICD-10-CM | POA: Diagnosis not present

## 2020-03-14 DIAGNOSIS — Z418 Encounter for other procedures for purposes other than remedying health state: Secondary | ICD-10-CM | POA: Diagnosis not present

## 2020-03-14 DIAGNOSIS — Z79899 Other long term (current) drug therapy: Secondary | ICD-10-CM | POA: Diagnosis not present

## 2020-03-14 DIAGNOSIS — Z942 Lung transplant status: Secondary | ICD-10-CM | POA: Diagnosis not present

## 2020-03-29 DIAGNOSIS — L739 Follicular disorder, unspecified: Secondary | ICD-10-CM | POA: Diagnosis not present

## 2020-03-29 DIAGNOSIS — L814 Other melanin hyperpigmentation: Secondary | ICD-10-CM | POA: Diagnosis not present

## 2020-03-29 DIAGNOSIS — Z85828 Personal history of other malignant neoplasm of skin: Secondary | ICD-10-CM | POA: Diagnosis not present

## 2020-03-29 DIAGNOSIS — L570 Actinic keratosis: Secondary | ICD-10-CM | POA: Diagnosis not present

## 2020-03-29 DIAGNOSIS — L579 Skin changes due to chronic exposure to nonionizing radiation, unspecified: Secondary | ICD-10-CM | POA: Diagnosis not present

## 2020-03-30 DIAGNOSIS — Z942 Lung transplant status: Secondary | ICD-10-CM | POA: Diagnosis not present

## 2020-03-30 DIAGNOSIS — Z418 Encounter for other procedures for purposes other than remedying health state: Secondary | ICD-10-CM | POA: Diagnosis not present

## 2020-03-30 DIAGNOSIS — Z79899 Other long term (current) drug therapy: Secondary | ICD-10-CM | POA: Diagnosis not present

## 2020-03-30 DIAGNOSIS — T86819 Unspecified complication of lung transplant: Secondary | ICD-10-CM | POA: Diagnosis not present

## 2020-04-10 DIAGNOSIS — Z5181 Encounter for therapeutic drug level monitoring: Secondary | ICD-10-CM | POA: Diagnosis not present

## 2020-04-10 DIAGNOSIS — Z79899 Other long term (current) drug therapy: Secondary | ICD-10-CM | POA: Diagnosis not present

## 2020-04-10 DIAGNOSIS — I129 Hypertensive chronic kidney disease with stage 1 through stage 4 chronic kidney disease, or unspecified chronic kidney disease: Secondary | ICD-10-CM | POA: Diagnosis not present

## 2020-04-10 DIAGNOSIS — Z7982 Long term (current) use of aspirin: Secondary | ICD-10-CM | POA: Diagnosis not present

## 2020-04-10 DIAGNOSIS — Z794 Long term (current) use of insulin: Secondary | ICD-10-CM | POA: Diagnosis not present

## 2020-04-10 DIAGNOSIS — R251 Tremor, unspecified: Secondary | ICD-10-CM | POA: Diagnosis not present

## 2020-04-10 DIAGNOSIS — D84821 Immunodeficiency due to drugs: Secondary | ICD-10-CM | POA: Diagnosis not present

## 2020-04-10 DIAGNOSIS — Z7952 Long term (current) use of systemic steroids: Secondary | ICD-10-CM | POA: Diagnosis not present

## 2020-04-10 DIAGNOSIS — Z8616 Personal history of COVID-19: Secondary | ICD-10-CM | POA: Diagnosis not present

## 2020-04-10 DIAGNOSIS — D849 Immunodeficiency, unspecified: Secondary | ICD-10-CM | POA: Diagnosis not present

## 2020-04-10 DIAGNOSIS — M1991 Primary osteoarthritis, unspecified site: Secondary | ICD-10-CM | POA: Diagnosis not present

## 2020-04-10 DIAGNOSIS — E1122 Type 2 diabetes mellitus with diabetic chronic kidney disease: Secondary | ICD-10-CM | POA: Diagnosis not present

## 2020-04-10 DIAGNOSIS — R942 Abnormal results of pulmonary function studies: Secondary | ICD-10-CM | POA: Diagnosis not present

## 2020-04-10 DIAGNOSIS — N183 Chronic kidney disease, stage 3 unspecified: Secondary | ICD-10-CM | POA: Diagnosis not present

## 2020-04-10 DIAGNOSIS — T86818 Other complications of lung transplant: Secondary | ICD-10-CM | POA: Diagnosis not present

## 2020-04-10 DIAGNOSIS — N1831 Chronic kidney disease, stage 3a: Secondary | ICD-10-CM | POA: Diagnosis not present

## 2020-04-10 DIAGNOSIS — Z8709 Personal history of other diseases of the respiratory system: Secondary | ICD-10-CM | POA: Diagnosis not present

## 2020-04-10 DIAGNOSIS — I480 Paroxysmal atrial fibrillation: Secondary | ICD-10-CM | POA: Diagnosis not present

## 2020-04-10 DIAGNOSIS — J8410 Pulmonary fibrosis, unspecified: Secondary | ICD-10-CM | POA: Diagnosis not present

## 2020-04-10 DIAGNOSIS — Z4824 Encounter for aftercare following lung transplant: Secondary | ICD-10-CM | POA: Diagnosis not present

## 2020-04-10 DIAGNOSIS — M1710 Unilateral primary osteoarthritis, unspecified knee: Secondary | ICD-10-CM | POA: Diagnosis not present

## 2020-04-10 DIAGNOSIS — Z942 Lung transplant status: Secondary | ICD-10-CM | POA: Diagnosis not present

## 2020-04-11 DIAGNOSIS — T86810 Lung transplant rejection: Secondary | ICD-10-CM | POA: Diagnosis not present

## 2020-05-08 DIAGNOSIS — T86819 Unspecified complication of lung transplant: Secondary | ICD-10-CM | POA: Diagnosis not present

## 2020-05-08 DIAGNOSIS — Z79899 Other long term (current) drug therapy: Secondary | ICD-10-CM | POA: Diagnosis not present

## 2020-05-08 DIAGNOSIS — M25561 Pain in right knee: Secondary | ICD-10-CM | POA: Diagnosis not present

## 2020-05-08 DIAGNOSIS — Z942 Lung transplant status: Secondary | ICD-10-CM | POA: Diagnosis not present

## 2020-05-08 DIAGNOSIS — M25562 Pain in left knee: Secondary | ICD-10-CM | POA: Diagnosis not present

## 2020-05-08 DIAGNOSIS — Z418 Encounter for other procedures for purposes other than remedying health state: Secondary | ICD-10-CM | POA: Diagnosis not present

## 2020-05-14 DIAGNOSIS — D225 Melanocytic nevi of trunk: Secondary | ICD-10-CM | POA: Diagnosis not present

## 2020-05-14 DIAGNOSIS — D485 Neoplasm of uncertain behavior of skin: Secondary | ICD-10-CM | POA: Diagnosis not present

## 2020-05-14 DIAGNOSIS — D0462 Carcinoma in situ of skin of left upper limb, including shoulder: Secondary | ICD-10-CM | POA: Diagnosis not present

## 2020-05-14 DIAGNOSIS — D1801 Hemangioma of skin and subcutaneous tissue: Secondary | ICD-10-CM | POA: Diagnosis not present

## 2020-05-14 DIAGNOSIS — L570 Actinic keratosis: Secondary | ICD-10-CM | POA: Diagnosis not present

## 2020-05-14 DIAGNOSIS — L814 Other melanin hyperpigmentation: Secondary | ICD-10-CM | POA: Diagnosis not present

## 2020-06-06 DIAGNOSIS — Z942 Lung transplant status: Secondary | ICD-10-CM | POA: Diagnosis not present

## 2020-06-06 DIAGNOSIS — T86819 Unspecified complication of lung transplant: Secondary | ICD-10-CM | POA: Diagnosis not present

## 2020-06-06 DIAGNOSIS — Z418 Encounter for other procedures for purposes other than remedying health state: Secondary | ICD-10-CM | POA: Diagnosis not present

## 2020-06-06 DIAGNOSIS — Z79899 Other long term (current) drug therapy: Secondary | ICD-10-CM | POA: Diagnosis not present

## 2020-06-07 DIAGNOSIS — L739 Follicular disorder, unspecified: Secondary | ICD-10-CM | POA: Diagnosis not present

## 2020-06-07 DIAGNOSIS — D485 Neoplasm of uncertain behavior of skin: Secondary | ICD-10-CM | POA: Diagnosis not present

## 2020-06-07 DIAGNOSIS — L570 Actinic keratosis: Secondary | ICD-10-CM | POA: Diagnosis not present

## 2020-06-07 DIAGNOSIS — D0462 Carcinoma in situ of skin of left upper limb, including shoulder: Secondary | ICD-10-CM | POA: Diagnosis not present

## 2020-06-28 ENCOUNTER — Other Ambulatory Visit: Payer: Self-pay

## 2020-06-28 ENCOUNTER — Ambulatory Visit: Payer: Medicare Other | Admitting: Family Medicine

## 2020-07-06 ENCOUNTER — Ambulatory Visit (INDEPENDENT_AMBULATORY_CARE_PROVIDER_SITE_OTHER): Payer: Medicare Other

## 2020-07-06 ENCOUNTER — Encounter: Payer: Self-pay | Admitting: Family Medicine

## 2020-07-06 ENCOUNTER — Other Ambulatory Visit: Payer: Self-pay

## 2020-07-06 ENCOUNTER — Ambulatory Visit (INDEPENDENT_AMBULATORY_CARE_PROVIDER_SITE_OTHER): Payer: Medicare Other | Admitting: Family Medicine

## 2020-07-06 VITALS — BP 148/68 | HR 73 | Temp 97.5°F | Resp 20 | Ht 68.0 in | Wt 165.0 lb

## 2020-07-06 DIAGNOSIS — Z0001 Encounter for general adult medical examination with abnormal findings: Secondary | ICD-10-CM | POA: Diagnosis not present

## 2020-07-06 DIAGNOSIS — I10 Essential (primary) hypertension: Secondary | ICD-10-CM

## 2020-07-06 DIAGNOSIS — M8589 Other specified disorders of bone density and structure, multiple sites: Secondary | ICD-10-CM | POA: Diagnosis not present

## 2020-07-06 DIAGNOSIS — E785 Hyperlipidemia, unspecified: Secondary | ICD-10-CM

## 2020-07-06 DIAGNOSIS — M810 Age-related osteoporosis without current pathological fracture: Secondary | ICD-10-CM | POA: Diagnosis not present

## 2020-07-06 DIAGNOSIS — D849 Immunodeficiency, unspecified: Secondary | ICD-10-CM

## 2020-07-06 DIAGNOSIS — H04123 Dry eye syndrome of bilateral lacrimal glands: Secondary | ICD-10-CM

## 2020-07-06 DIAGNOSIS — Z942 Lung transplant status: Secondary | ICD-10-CM

## 2020-07-06 DIAGNOSIS — M1990 Unspecified osteoarthritis, unspecified site: Secondary | ICD-10-CM

## 2020-07-06 DIAGNOSIS — Z780 Asymptomatic menopausal state: Secondary | ICD-10-CM | POA: Diagnosis not present

## 2020-07-06 DIAGNOSIS — Z0000 Encounter for general adult medical examination without abnormal findings: Secondary | ICD-10-CM

## 2020-07-06 DIAGNOSIS — M8588 Other specified disorders of bone density and structure, other site: Secondary | ICD-10-CM | POA: Diagnosis not present

## 2020-07-06 DIAGNOSIS — N1831 Chronic kidney disease, stage 3a: Secondary | ICD-10-CM

## 2020-07-06 LAB — URINALYSIS
Bilirubin, UA: NEGATIVE
Glucose, UA: NEGATIVE
Ketones, UA: NEGATIVE
Leukocytes,UA: NEGATIVE
Nitrite, UA: NEGATIVE
Protein,UA: NEGATIVE
RBC, UA: NEGATIVE
Specific Gravity, UA: 1.02 (ref 1.005–1.030)
Urobilinogen, Ur: 0.1 mg/dL — ABNORMAL LOW (ref 0.2–1.0)
pH, UA: 7 (ref 5.0–7.5)

## 2020-07-06 MED ORDER — PANTOPRAZOLE SODIUM 40 MG PO TBEC
40.0000 mg | DELAYED_RELEASE_TABLET | Freq: Every day | ORAL | 1 refills | Status: DC
Start: 2020-07-06 — End: 2021-08-07

## 2020-07-06 MED ORDER — RISEDRONATE SODIUM 150 MG PO TABS: 150.0000 mg | ORAL_TABLET | ORAL | 3 refills | Status: DC

## 2020-07-06 MED ORDER — RESTASIS 0.05 % OP EMUL: 1.0000 [drp] | Freq: Two times a day (BID) | OPHTHALMIC | 11 refills | Status: DC

## 2020-07-06 MED ORDER — DULOXETINE HCL 30 MG PO CPEP: 30.0000 mg | ORAL_CAPSULE | Freq: Every day | ORAL | 0 refills | Status: DC

## 2020-07-06 MED ORDER — PRAVASTATIN SODIUM 40 MG PO TABS: 40.0000 mg | ORAL_TABLET | Freq: Every day | ORAL | 1 refills | Status: DC

## 2020-07-06 MED ORDER — AMLODIPINE BESYLATE 5 MG PO TABS: 5.0000 mg | ORAL_TABLET | Freq: Every day | ORAL | 3 refills | Status: DC

## 2020-07-06 NOTE — Progress Notes (Signed)
Subjective:  Patient ID: Vernon Richard, male    DOB: 1951/04/24  Age: 69 y.o. MRN: [id number]  CC: Annual Exam   HPI [name] presents for annual physical examination.  He is also new to my practice.  He has been followed in the past by multiple providers most recently Particia Richard.  Approximately 2 years ago he received a lung transplant.  They only did the right side.  There was not room due to left diaphragmatic eventration to do a left lung.  Patient was severely short of breath before that time.  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  He was never a smoker and they do not really know why his problem arose.  He suffered from pulmonary fibrosis which was apparently idiopathic.  He was exposed to a lot of dust both at the farm and in his work through [employer].  There is speculation that that may have contributed.  Patient is noted to have multiple skin cancers over the years.  There was some concern that his immunosuppressants might actually exacerbate his skin cancers.  Therefore, he sees a dermatologist on a regular basis.  Since he is taking prednisone long-term, he and his wife who is in attendance today requested a bone density scan.  He has a great deal of arthritis pain.  Unfortunately he cannot take NSAIDs for reasons of the transplant and the use of steroids noted above.  He had a colonoscopy 2 years ago that was essentially normal.  Patient has had dry eyes for some time and that he drives him crazy he says.  Follow-up of hypertension. Patient has no history of headache chest pain or shortness of breath or recent cough. Patient also denies symptoms of TIA such as numbness weakness lateralizing. Patient checks  blood pressure at home and has not had any elevated readings recently. Patient denies side effects from his medication. States taking it regularly. Patient in for follow-up  of elevated cholesterol. Doing well without complaints on current medication. Denies side effects of statin including myalgia and arthralgia and nausea. Also in today for liver function testing. Currently no chest pain, shortness of breath or other cardiovascular related symptoms noted.  Depression screen [facility] 2/9 07/06/2020 10/11/2019 05/27/2018  Decreased Interest 0 0 0  Down, Depressed, Hopeless 0 0 0  PHQ - 2 Score 0 0 0    History Olander has a past medical history of Arthritis, Heart murmur, systolic, Hyperlipidemia, Hypertension, and Pulmonary fibrosis ([place]) (05/03/2018).   He has a past surgical history that includes Hernia repair; Cataract extraction, bilateral; and Lung transplant, single (Right, 09/15/2018).   His family history includes Cancer in his father and mother.He reports that he has never smoked. He has never used smokeless tobacco. He reports that he does not drink alcohol and does not use drugs.    ROS Review of Systems  Constitutional: Positive for activity change (Improved since his transplant). Negative for appetite change and fever.  HENT: Negative.   Eyes: Positive for pain and itching.  Respiratory: Positive for shortness of breath (Improved since his transplant). Negative for cough.   Cardiovascular: Negative for chest pain and leg swelling.  Gastrointestinal: Negative for abdominal pain, diarrhea, nausea and vomiting.  Genitourinary: Negative for difficulty urinating.  Musculoskeletal: Positive for arthralgias (Involving multiple joints throughout his body). Negative for myalgias.  Skin:  Negative for rash.  Neurological: Negative for headaches.  Psychiatric/Behavioral: Negative for sleep disturbance.    Objective:  BP (!) 148/68   Pulse 73   Temp (!) 97.5 F (36.4 C) (Temporal)   Resp 20   Ht '5\' 8"'  (1.727 m)   Wt 165 lb (74.8 kg)   SpO2 95%   BMI 25.09 kg/m   BP Readings from Last 3 Encounters:  07/06/20 (!) 148/68  10/11/19 110/69  05/27/18  115/66    Wt Readings from Last 3 Encounters:  07/06/20 165 lb (74.8 kg)  10/11/19 172 lb 6.4 oz (78.2 kg)  05/27/18 178 lb (80.7 kg)     Physical Exam Constitutional:      Appearance: He is well-developed.  HENT:     Head: Normocephalic and atraumatic.  Eyes:     Pupils: Pupils are equal, round, and reactive to light.  Neck:     Thyroid: No thyromegaly.     Trachea: No tracheal deviation.  Cardiovascular:     Rate and Rhythm: Normal rate and regular rhythm.     Heart sounds: Normal heart sounds. No murmur heard.  No friction rub. No gallop.   Pulmonary:     Breath sounds: No wheezing or rales.     Comments: Breath sounds are diminished on the left compared to the right.  This is consistent with his history of transplant Abdominal:     General: Bowel sounds are normal. There is no distension.     Palpations: Abdomen is soft. There is no mass.     Tenderness: There is no abdominal tenderness.     Hernia: There is no hernia in the left inguinal area.  Genitourinary:    Penis: Normal.      Testes: Normal.  Musculoskeletal:        General: Normal range of motion.     Cervical back: Normal range of motion.  Lymphadenopathy:     Cervical: No cervical adenopathy.  Skin:    General: Skin is warm and dry.     Findings: Lesion (There is right lateral thoracotomy scar from his transplant.  This is well-healed.) present.  Neurological:     Mental Status: He is alert and oriented to person, place, and time.       Assessment & Plan:   Sandor was seen today for annual exam.  Diagnoses and all orders for this visit:  Well adult exam  Essential hypertension -     CBC with Differential/Platelet -     CMP14+EGFR -     VITAMIN D 25 Hydroxy (Vit-D Deficiency, Fractures) -     Lipid panel -     PSA, total and free -     Urinalysis -     DG [facility] DEXA; Future -     amLODipine (NORVASC) 5 MG tablet; Take 1 tablet (5 mg total) by mouth daily.  Hyperlipidemia, unspecified  hyperlipidemia type -     CBC with Differential/Platelet -     CMP14+EGFR -     VITAMIN D 25 Hydroxy (Vit-D Deficiency, Fractures) -     Lipid panel -     PSA, total and free -     Urinalysis -     DG [facility] DEXA; Future -     pravastatin (PRAVACHOL) 40 MG tablet; Take 1 tablet (40 mg total) by mouth daily.  Osteopenia of multiple sites -     CBC with Differential/Platelet -     CMP14+EGFR -  VITAMIN D 25 Hydroxy (Vit-D Deficiency, Fractures) -     Lipid panel -     PSA, total and free -     Urinalysis -     Cancel: HM DEXA SCAN -     DG [facility] DEXA; Future  Dry eye syndrome of both eyes -     cycloSPORINE (RESTASIS) 0.05 % ophthalmic emulsion; Place 1 drop into both eyes 2 (two) times daily.  Stage 3a chronic kidney disease  Status post lung transplantation (HCC)  Immunosuppression (HCC)  Arthritis -     DULoxetine (CYMBALTA) 30 MG capsule; Take 1 capsule (30 mg total) by mouth daily. For one week then two daily for arthritis pain. Take with a full stomach at suppertime  Other orders -     Discontinue: cycloSPORINE (RESTASIS) 0.05 % ophthalmic emulsion; Place 1 drop into both eyes 2 (two) times daily. -     Discontinue: DULoxetine (CYMBALTA) 30 MG capsule; Take 1 capsule (30 mg total) by mouth daily. For one week then two daily for arthritis pain. Take with a full stomach at suppertime -     pantoprazole (PROTONIX) 40 MG tablet; Take 1 tablet (40 mg total) by mouth daily. -     risedronate (ACTONEL) 150 MG tablet; Take 1 tablet (150 mg total) by mouth every 30 (thirty) days. with water, on an empty stomach, take nothing by mouth and to not lie down for 30 minutes after each dose.       I have changed Kathlene November. Vannest's pantoprazole. I am also having him start on risedronate. Additionally, I am having him maintain his aspirin, cetirizine, sulfamethoxazole-trimethoprim, Tacrolimus ER, mycophenolate, predniSONE, MAGNESIUM GLYCINATE PO, acetaminophen, polyethylene glycol powder,  Calcium Citrate-Vitamin D, Multi-Vitamin, fluticasone, montelukast, DULoxetine, Restasis, pravastatin, and amLODipine.  Allergies as of 07/06/2020      Reactions   Nsaids Other (See Comments)   Transplant      Medication List       Accurate as of July 06, 2020  7:22 PM. If you have any questions, ask your nurse or doctor.        acetaminophen 500 MG tablet Commonly known as: TYLENOL Take by mouth.   amLODipine 5 MG tablet Commonly known as: NORVASC Take 1 tablet (5 mg total) by mouth daily.   aspirin 81 MG tablet Take 81 mg by mouth daily.   Calcium Citrate-Vitamin D 200-250 MG-UNIT Tabs Take by mouth.   cetirizine 10 MG tablet Commonly known as: ZYRTEC Take 10 mg by mouth daily.   DULoxetine 30 MG capsule Commonly known as: Cymbalta Take 1 capsule (30 mg total) by mouth daily. For one week then two daily for arthritis pain. Take with a full stomach at suppertime Started by: Claretta Fraise, MD   fluticasone 50 MCG/ACT nasal spray Commonly known as: FLONASE Place 2 sprays into both nostrils daily.   MAGNESIUM GLYCINATE PO Take 800 mg by mouth 2 (two) times daily.   montelukast 10 MG tablet Commonly known as: SINGULAIR Take 1 tablet (10 mg total) by mouth at bedtime.   Multi-Vitamin tablet Take by mouth.   mycophenolate 250 MG capsule Commonly known as: CELLCEPT Take by mouth.   pantoprazole 40 MG tablet Commonly known as: PROTONIX Take 1 tablet (40 mg total) by mouth daily. What changed:   how much to take  when to take this Changed by: Claretta Fraise, MD   polyethylene glycol powder 17 GM/SCOOP powder Commonly known as: GLYCOLAX/MIRALAX Take by mouth.   pravastatin 40  MG tablet Commonly known as: PRAVACHOL Take 1 tablet (40 mg total) by mouth daily.   predniSONE 5 MG tablet Commonly known as: DELTASONE PLEASE SEE ATTACHED FOR DETAILED DIRECTIONS   Restasis 0.05 % ophthalmic emulsion Generic drug: cycloSPORINE Place 1 drop into both  eyes 2 (two) times daily. Started by: Claretta Fraise, MD   risedronate 150 MG tablet Commonly known as: Actonel Take 1 tablet (150 mg total) by mouth every 30 (thirty) days. with water, on an empty stomach, take nothing by mouth and to not lie down for 30 minutes after each dose. Started by: Claretta Fraise, MD   sulfamethoxazole-trimethoprim 400-80 MG tablet Commonly known as: BACTRIM   Tacrolimus ER 1 MG Tb24 08/23/19 Take three- 1 mg tabs daily (total of 3 mg).        Follow-up: Return in about 6 months (around 01/03/2021), or if symptoms worsen or fail to improve.  Claretta Fraise, M.D.

## 2020-07-07 LAB — LIPID PANEL
Chol/HDL Ratio: 3.9 ratio (ref 0.0–5.0)
Cholesterol, Total: 224 mg/dL — ABNORMAL HIGH (ref 100–199)
HDL: 58 mg/dL (ref >39)
LDL Chol Calc (NIH): 127 mg/dL — ABNORMAL HIGH (ref 0–99)
Triglycerides: 223 mg/dL — ABNORMAL HIGH (ref 0–149)
VLDL Cholesterol Cal: 39 mg/dL (ref 5–40)

## 2020-07-07 LAB — PSA, TOTAL AND FREE
PSA, Free Pct: 56.7 %
PSA, Free: 0.17 ng/mL
Prostate Specific Ag, Serum: 0.3 ng/mL (ref 0.0–4.0)

## 2020-07-07 LAB — CMP14+EGFR
ALT: 10 IU/L (ref 0–44)
AST: 18 IU/L (ref 0–40)
Albumin/Globulin Ratio: 3.1 — ABNORMAL HIGH (ref 1.2–2.2)
Albumin: 4.7 g/dL (ref 3.8–4.8)
Alkaline Phosphatase: 47 IU/L (ref 44–121)
BUN/Creatinine Ratio: 9 — ABNORMAL LOW (ref 10–24)
BUN: 14 mg/dL (ref 8–27)
Bilirubin Total: 0.3 mg/dL (ref 0.0–1.2)
CO2: 24 mmol/L (ref 20–29)
Calcium: 9.5 mg/dL (ref 8.6–10.2)
Chloride: 101 mmol/L (ref 96–106)
Creatinine, Ser: 1.5 mg/dL — ABNORMAL HIGH (ref 0.76–1.27)
GFR calc Af Amer: 54 mL/min/{1.73_m2} — ABNORMAL LOW (ref >59)
GFR calc non Af Amer: 46 mL/min/{1.73_m2} — ABNORMAL LOW (ref >59)
Globulin, Total: 1.5 g/dL (ref 1.5–4.5)
Glucose: 93 mg/dL (ref 65–99)
Potassium: 4.3 mmol/L (ref 3.5–5.2)
Sodium: 139 mmol/L (ref 134–144)
Total Protein: 6.2 g/dL (ref 6.0–8.5)

## 2020-07-07 LAB — CBC WITH DIFFERENTIAL/PLATELET
Basophils Absolute: 0.1 10*3/uL (ref 0.0–0.2)
Basos: 1 %
EOS (ABSOLUTE): 0.1 10*3/uL (ref 0.0–0.4)
Eos: 2 %
Hematocrit: 37.3 % — ABNORMAL LOW (ref 37.5–51.0)
Hemoglobin: 12.6 g/dL — ABNORMAL LOW (ref 13.0–17.7)
Immature Grans (Abs): 0.1 10*3/uL (ref 0.0–0.1)
Immature Granulocytes: 2 %
Lymphocytes Absolute: 0.8 10*3/uL (ref 0.7–3.1)
Lymphs: 16 %
MCH: 27.9 pg (ref 26.6–33.0)
MCHC: 33.8 g/dL (ref 31.5–35.7)
MCV: 83 fL (ref 79–97)
Monocytes Absolute: 0.7 10*3/uL (ref 0.1–0.9)
Monocytes: 14 %
Neutrophils Absolute: 3.5 10*3/uL (ref 1.4–7.0)
Neutrophils: 65 %
Platelets: 244 10*3/uL (ref 150–450)
RBC: 4.51 x10E6/uL (ref 4.14–5.80)
RDW: 13.2 % (ref 11.6–15.4)
WBC: 5.3 10*3/uL (ref 3.4–10.8)

## 2020-07-07 LAB — VITAMIN D 25 HYDROXY (VIT D DEFICIENCY, FRACTURES): Vit D, 25-Hydroxy: 38.1 ng/mL (ref 30.0–100.0)

## 2020-07-09 ENCOUNTER — Telehealth: Payer: Self-pay

## 2020-07-09 NOTE — Telephone Encounter (Signed)
Per Dr. Livia Snellen,  Patients dexa scan shows osteopenia.  Dr. Livia Snellen sent in Actonel to pharmacy.  Left message for patient to call back to inform of results.

## 2020-07-09 NOTE — Telephone Encounter (Signed)
NA

## 2020-07-09 NOTE — Telephone Encounter (Signed)
Pt spouse rc for dexa results please call phone or cell

## 2020-07-09 NOTE — Telephone Encounter (Signed)
Pt aware.

## 2020-07-12 DIAGNOSIS — M1710 Unilateral primary osteoarthritis, unspecified knee: Secondary | ICD-10-CM | POA: Diagnosis not present

## 2020-07-12 DIAGNOSIS — I129 Hypertensive chronic kidney disease with stage 1 through stage 4 chronic kidney disease, or unspecified chronic kidney disease: Secondary | ICD-10-CM | POA: Diagnosis not present

## 2020-07-12 DIAGNOSIS — D849 Immunodeficiency, unspecified: Secondary | ICD-10-CM | POA: Diagnosis not present

## 2020-07-12 DIAGNOSIS — Z7982 Long term (current) use of aspirin: Secondary | ICD-10-CM | POA: Diagnosis not present

## 2020-07-12 DIAGNOSIS — Z942 Lung transplant status: Secondary | ICD-10-CM | POA: Diagnosis not present

## 2020-07-12 DIAGNOSIS — N183 Chronic kidney disease, stage 3 unspecified: Secondary | ICD-10-CM | POA: Diagnosis not present

## 2020-07-12 DIAGNOSIS — Z7901 Long term (current) use of anticoagulants: Secondary | ICD-10-CM | POA: Diagnosis not present

## 2020-07-12 DIAGNOSIS — M1991 Primary osteoarthritis, unspecified site: Secondary | ICD-10-CM | POA: Diagnosis not present

## 2020-07-12 DIAGNOSIS — Z5181 Encounter for therapeutic drug level monitoring: Secondary | ICD-10-CM | POA: Diagnosis not present

## 2020-07-12 DIAGNOSIS — T86818 Other complications of lung transplant: Secondary | ICD-10-CM | POA: Diagnosis not present

## 2020-07-12 DIAGNOSIS — Z4824 Encounter for aftercare following lung transplant: Secondary | ICD-10-CM | POA: Diagnosis not present

## 2020-07-12 DIAGNOSIS — Z794 Long term (current) use of insulin: Secondary | ICD-10-CM | POA: Diagnosis not present

## 2020-07-12 DIAGNOSIS — E1122 Type 2 diabetes mellitus with diabetic chronic kidney disease: Secondary | ICD-10-CM | POA: Diagnosis not present

## 2020-07-12 DIAGNOSIS — R251 Tremor, unspecified: Secondary | ICD-10-CM | POA: Diagnosis not present

## 2020-07-12 DIAGNOSIS — I4891 Unspecified atrial fibrillation: Secondary | ICD-10-CM | POA: Diagnosis not present

## 2020-07-12 DIAGNOSIS — T86810 Lung transplant rejection: Secondary | ICD-10-CM | POA: Diagnosis not present

## 2020-07-12 DIAGNOSIS — N1831 Chronic kidney disease, stage 3a: Secondary | ICD-10-CM | POA: Diagnosis not present

## 2020-07-12 DIAGNOSIS — Z7952 Long term (current) use of systemic steroids: Secondary | ICD-10-CM | POA: Diagnosis not present

## 2020-07-12 DIAGNOSIS — Z79899 Other long term (current) drug therapy: Secondary | ICD-10-CM | POA: Diagnosis not present

## 2020-07-12 DIAGNOSIS — E785 Hyperlipidemia, unspecified: Secondary | ICD-10-CM | POA: Diagnosis not present

## 2020-07-12 DIAGNOSIS — D84821 Immunodeficiency due to drugs: Secondary | ICD-10-CM | POA: Diagnosis not present

## 2020-07-26 DIAGNOSIS — Z79899 Other long term (current) drug therapy: Secondary | ICD-10-CM | POA: Diagnosis not present

## 2020-07-26 DIAGNOSIS — T86819 Unspecified complication of lung transplant: Secondary | ICD-10-CM | POA: Diagnosis not present

## 2020-07-26 DIAGNOSIS — Z298 Encounter for other specified prophylactic measures: Secondary | ICD-10-CM | POA: Diagnosis not present

## 2020-07-26 DIAGNOSIS — Z942 Lung transplant status: Secondary | ICD-10-CM | POA: Diagnosis not present

## 2020-07-29 ENCOUNTER — Other Ambulatory Visit: Payer: Self-pay | Admitting: Family Medicine

## 2020-07-29 DIAGNOSIS — M1990 Unspecified osteoarthritis, unspecified site: Secondary | ICD-10-CM

## 2020-08-09 DIAGNOSIS — M25562 Pain in left knee: Secondary | ICD-10-CM | POA: Diagnosis not present

## 2020-08-09 DIAGNOSIS — M25561 Pain in right knee: Secondary | ICD-10-CM | POA: Diagnosis not present

## 2020-08-14 DIAGNOSIS — G8929 Other chronic pain: Secondary | ICD-10-CM | POA: Diagnosis not present

## 2020-08-14 DIAGNOSIS — M170 Bilateral primary osteoarthritis of knee: Secondary | ICD-10-CM | POA: Diagnosis not present

## 2020-08-14 DIAGNOSIS — M25562 Pain in left knee: Secondary | ICD-10-CM | POA: Diagnosis not present

## 2020-08-14 DIAGNOSIS — M25561 Pain in right knee: Secondary | ICD-10-CM | POA: Diagnosis not present

## 2020-08-20 DIAGNOSIS — L570 Actinic keratosis: Secondary | ICD-10-CM | POA: Diagnosis not present

## 2020-08-20 DIAGNOSIS — D1801 Hemangioma of skin and subcutaneous tissue: Secondary | ICD-10-CM | POA: Diagnosis not present

## 2020-08-20 DIAGNOSIS — L814 Other melanin hyperpigmentation: Secondary | ICD-10-CM | POA: Diagnosis not present

## 2020-08-20 DIAGNOSIS — L821 Other seborrheic keratosis: Secondary | ICD-10-CM | POA: Diagnosis not present

## 2020-08-20 DIAGNOSIS — L579 Skin changes due to chronic exposure to nonionizing radiation, unspecified: Secondary | ICD-10-CM | POA: Diagnosis not present

## 2020-08-23 DIAGNOSIS — Z298 Encounter for other specified prophylactic measures: Secondary | ICD-10-CM | POA: Diagnosis not present

## 2020-08-23 DIAGNOSIS — Z942 Lung transplant status: Secondary | ICD-10-CM | POA: Diagnosis not present

## 2020-08-23 DIAGNOSIS — L718 Other rosacea: Secondary | ICD-10-CM | POA: Diagnosis not present

## 2020-08-23 DIAGNOSIS — T86819 Unspecified complication of lung transplant: Secondary | ICD-10-CM | POA: Diagnosis not present

## 2020-08-23 DIAGNOSIS — Z79899 Other long term (current) drug therapy: Secondary | ICD-10-CM | POA: Diagnosis not present

## 2020-08-23 DIAGNOSIS — H04123 Dry eye syndrome of bilateral lacrimal glands: Secondary | ICD-10-CM | POA: Diagnosis not present

## 2020-08-23 DIAGNOSIS — H02886 Meibomian gland dysfunction of left eye, unspecified eyelid: Secondary | ICD-10-CM | POA: Diagnosis not present

## 2020-08-23 DIAGNOSIS — H02883 Meibomian gland dysfunction of right eye, unspecified eyelid: Secondary | ICD-10-CM | POA: Diagnosis not present

## 2020-09-06 DIAGNOSIS — M25561 Pain in right knee: Secondary | ICD-10-CM | POA: Diagnosis not present

## 2020-09-06 DIAGNOSIS — M25562 Pain in left knee: Secondary | ICD-10-CM | POA: Diagnosis not present

## 2020-09-06 DIAGNOSIS — M170 Bilateral primary osteoarthritis of knee: Secondary | ICD-10-CM | POA: Diagnosis not present

## 2020-09-12 DIAGNOSIS — T86819 Unspecified complication of lung transplant: Secondary | ICD-10-CM | POA: Diagnosis not present

## 2020-09-12 DIAGNOSIS — C4402 Squamous cell carcinoma of skin of lip: Secondary | ICD-10-CM | POA: Diagnosis not present

## 2020-09-12 DIAGNOSIS — Z942 Lung transplant status: Secondary | ICD-10-CM | POA: Diagnosis not present

## 2020-09-12 DIAGNOSIS — D485 Neoplasm of uncertain behavior of skin: Secondary | ICD-10-CM | POA: Diagnosis not present

## 2020-09-12 DIAGNOSIS — Z79899 Other long term (current) drug therapy: Secondary | ICD-10-CM | POA: Diagnosis not present

## 2020-09-12 DIAGNOSIS — Z298 Encounter for other specified prophylactic measures: Secondary | ICD-10-CM | POA: Diagnosis not present

## 2020-09-27 DIAGNOSIS — D801 Nonfamilial hypogammaglobulinemia: Secondary | ICD-10-CM | POA: Diagnosis not present

## 2020-09-27 DIAGNOSIS — N1831 Chronic kidney disease, stage 3a: Secondary | ICD-10-CM | POA: Diagnosis not present

## 2020-09-27 DIAGNOSIS — D849 Immunodeficiency, unspecified: Secondary | ICD-10-CM | POA: Diagnosis not present

## 2020-09-27 DIAGNOSIS — T86818 Other complications of lung transplant: Secondary | ICD-10-CM | POA: Diagnosis not present

## 2020-09-27 DIAGNOSIS — Z5181 Encounter for therapeutic drug level monitoring: Secondary | ICD-10-CM | POA: Diagnosis not present

## 2020-09-27 DIAGNOSIS — Z4824 Encounter for aftercare following lung transplant: Secondary | ICD-10-CM | POA: Diagnosis not present

## 2020-09-27 DIAGNOSIS — N183 Chronic kidney disease, stage 3 unspecified: Secondary | ICD-10-CM | POA: Diagnosis not present

## 2020-09-27 DIAGNOSIS — Z942 Lung transplant status: Secondary | ICD-10-CM | POA: Diagnosis not present

## 2020-09-27 DIAGNOSIS — Z79899 Other long term (current) drug therapy: Secondary | ICD-10-CM | POA: Diagnosis not present

## 2020-09-27 DIAGNOSIS — Z886 Allergy status to analgesic agent status: Secondary | ICD-10-CM | POA: Diagnosis not present

## 2020-09-27 DIAGNOSIS — Z7982 Long term (current) use of aspirin: Secondary | ICD-10-CM | POA: Diagnosis not present

## 2020-09-27 DIAGNOSIS — Z7952 Long term (current) use of systemic steroids: Secondary | ICD-10-CM | POA: Diagnosis not present

## 2020-09-27 DIAGNOSIS — E785 Hyperlipidemia, unspecified: Secondary | ICD-10-CM | POA: Diagnosis not present

## 2020-09-27 DIAGNOSIS — R1312 Dysphagia, oropharyngeal phase: Secondary | ICD-10-CM | POA: Diagnosis not present

## 2020-09-27 DIAGNOSIS — I129 Hypertensive chronic kidney disease with stage 1 through stage 4 chronic kidney disease, or unspecified chronic kidney disease: Secondary | ICD-10-CM | POA: Diagnosis not present

## 2020-09-27 DIAGNOSIS — T86819 Unspecified complication of lung transplant: Secondary | ICD-10-CM | POA: Diagnosis not present

## 2020-09-28 ENCOUNTER — Other Ambulatory Visit: Payer: Self-pay | Admitting: *Deleted

## 2020-09-28 MED ORDER — MONTELUKAST SODIUM 10 MG PO TABS: 10.0000 mg | ORAL_TABLET | Freq: Every day | ORAL | 2 refills | Status: DC

## 2020-10-01 DIAGNOSIS — D801 Nonfamilial hypogammaglobulinemia: Secondary | ICD-10-CM | POA: Diagnosis not present

## 2020-10-01 DIAGNOSIS — T86810 Lung transplant rejection: Secondary | ICD-10-CM | POA: Diagnosis not present

## 2020-10-10 DIAGNOSIS — Z79899 Other long term (current) drug therapy: Secondary | ICD-10-CM | POA: Diagnosis not present

## 2020-10-10 DIAGNOSIS — Z942 Lung transplant status: Secondary | ICD-10-CM | POA: Diagnosis not present

## 2020-10-10 DIAGNOSIS — Z298 Encounter for other specified prophylactic measures: Secondary | ICD-10-CM | POA: Diagnosis not present

## 2020-10-10 DIAGNOSIS — T86819 Unspecified complication of lung transplant: Secondary | ICD-10-CM | POA: Diagnosis not present

## 2020-10-25 DIAGNOSIS — M170 Bilateral primary osteoarthritis of knee: Secondary | ICD-10-CM | POA: Diagnosis not present

## 2020-10-30 DIAGNOSIS — C4402 Squamous cell carcinoma of skin of lip: Secondary | ICD-10-CM | POA: Diagnosis not present

## 2020-11-08 DIAGNOSIS — L814 Other melanin hyperpigmentation: Secondary | ICD-10-CM | POA: Diagnosis not present

## 2020-11-08 DIAGNOSIS — Z79899 Other long term (current) drug therapy: Secondary | ICD-10-CM | POA: Diagnosis not present

## 2020-11-08 DIAGNOSIS — L570 Actinic keratosis: Secondary | ICD-10-CM | POA: Diagnosis not present

## 2020-11-08 DIAGNOSIS — M170 Bilateral primary osteoarthritis of knee: Secondary | ICD-10-CM | POA: Diagnosis not present

## 2020-11-08 DIAGNOSIS — Z85828 Personal history of other malignant neoplasm of skin: Secondary | ICD-10-CM | POA: Diagnosis not present

## 2020-11-08 DIAGNOSIS — L905 Scar conditions and fibrosis of skin: Secondary | ICD-10-CM | POA: Diagnosis not present

## 2020-11-08 DIAGNOSIS — L579 Skin changes due to chronic exposure to nonionizing radiation, unspecified: Secondary | ICD-10-CM | POA: Diagnosis not present

## 2020-11-08 DIAGNOSIS — T86819 Unspecified complication of lung transplant: Secondary | ICD-10-CM | POA: Diagnosis not present

## 2020-11-08 DIAGNOSIS — D225 Melanocytic nevi of trunk: Secondary | ICD-10-CM | POA: Diagnosis not present

## 2020-11-08 DIAGNOSIS — L821 Other seborrheic keratosis: Secondary | ICD-10-CM | POA: Diagnosis not present

## 2020-11-08 DIAGNOSIS — Z298 Encounter for other specified prophylactic measures: Secondary | ICD-10-CM | POA: Diagnosis not present

## 2020-11-08 DIAGNOSIS — Z942 Lung transplant status: Secondary | ICD-10-CM | POA: Diagnosis not present

## 2020-11-22 DIAGNOSIS — M170 Bilateral primary osteoarthritis of knee: Secondary | ICD-10-CM | POA: Diagnosis not present

## 2020-11-22 DIAGNOSIS — M25561 Pain in right knee: Secondary | ICD-10-CM | POA: Diagnosis not present

## 2020-12-06 DIAGNOSIS — Z79899 Other long term (current) drug therapy: Secondary | ICD-10-CM | POA: Diagnosis not present

## 2020-12-06 DIAGNOSIS — Z298 Encounter for other specified prophylactic measures: Secondary | ICD-10-CM | POA: Diagnosis not present

## 2020-12-06 DIAGNOSIS — Z942 Lung transplant status: Secondary | ICD-10-CM | POA: Diagnosis not present

## 2020-12-06 DIAGNOSIS — T86819 Unspecified complication of lung transplant: Secondary | ICD-10-CM | POA: Diagnosis not present

## 2020-12-19 DIAGNOSIS — M170 Bilateral primary osteoarthritis of knee: Secondary | ICD-10-CM | POA: Diagnosis not present

## 2020-12-19 DIAGNOSIS — M25562 Pain in left knee: Secondary | ICD-10-CM | POA: Diagnosis not present

## 2020-12-19 DIAGNOSIS — G894 Chronic pain syndrome: Secondary | ICD-10-CM | POA: Diagnosis not present

## 2020-12-19 DIAGNOSIS — G8929 Other chronic pain: Secondary | ICD-10-CM | POA: Diagnosis not present

## 2020-12-19 DIAGNOSIS — M25561 Pain in right knee: Secondary | ICD-10-CM | POA: Diagnosis not present

## 2020-12-27 DIAGNOSIS — D849 Immunodeficiency, unspecified: Secondary | ICD-10-CM | POA: Diagnosis not present

## 2020-12-27 DIAGNOSIS — J8410 Pulmonary fibrosis, unspecified: Secondary | ICD-10-CM | POA: Diagnosis not present

## 2020-12-27 DIAGNOSIS — D801 Nonfamilial hypogammaglobulinemia: Secondary | ICD-10-CM | POA: Diagnosis not present

## 2020-12-27 DIAGNOSIS — N183 Chronic kidney disease, stage 3 unspecified: Secondary | ICD-10-CM | POA: Diagnosis not present

## 2020-12-27 DIAGNOSIS — Z942 Lung transplant status: Secondary | ICD-10-CM | POA: Diagnosis not present

## 2020-12-27 DIAGNOSIS — R059 Cough, unspecified: Secondary | ICD-10-CM | POA: Diagnosis not present

## 2020-12-27 DIAGNOSIS — E1122 Type 2 diabetes mellitus with diabetic chronic kidney disease: Secondary | ICD-10-CM | POA: Diagnosis not present

## 2020-12-27 DIAGNOSIS — E785 Hyperlipidemia, unspecified: Secondary | ICD-10-CM | POA: Diagnosis not present

## 2020-12-27 DIAGNOSIS — T86818 Other complications of lung transplant: Secondary | ICD-10-CM | POA: Diagnosis not present

## 2020-12-27 DIAGNOSIS — Z5181 Encounter for therapeutic drug level monitoring: Secondary | ICD-10-CM | POA: Diagnosis not present

## 2020-12-27 DIAGNOSIS — N1831 Chronic kidney disease, stage 3a: Secondary | ICD-10-CM | POA: Diagnosis not present

## 2020-12-27 DIAGNOSIS — I129 Hypertensive chronic kidney disease with stage 1 through stage 4 chronic kidney disease, or unspecified chronic kidney disease: Secondary | ICD-10-CM | POA: Diagnosis not present

## 2020-12-27 DIAGNOSIS — Z794 Long term (current) use of insulin: Secondary | ICD-10-CM | POA: Diagnosis not present

## 2020-12-27 DIAGNOSIS — Z4824 Encounter for aftercare following lung transplant: Secondary | ICD-10-CM | POA: Diagnosis not present

## 2020-12-27 DIAGNOSIS — Z7982 Long term (current) use of aspirin: Secondary | ICD-10-CM | POA: Diagnosis not present

## 2020-12-27 DIAGNOSIS — Z79899 Other long term (current) drug therapy: Secondary | ICD-10-CM | POA: Diagnosis not present

## 2020-12-27 DIAGNOSIS — I480 Paroxysmal atrial fibrillation: Secondary | ICD-10-CM | POA: Diagnosis not present

## 2020-12-27 DIAGNOSIS — D84821 Immunodeficiency due to drugs: Secondary | ICD-10-CM | POA: Diagnosis not present

## 2020-12-27 DIAGNOSIS — M1710 Unilateral primary osteoarthritis, unspecified knee: Secondary | ICD-10-CM | POA: Diagnosis not present

## 2020-12-27 DIAGNOSIS — R251 Tremor, unspecified: Secondary | ICD-10-CM | POA: Diagnosis not present

## 2020-12-31 DIAGNOSIS — D801 Nonfamilial hypogammaglobulinemia: Secondary | ICD-10-CM | POA: Diagnosis not present

## 2020-12-31 DIAGNOSIS — T86810 Lung transplant rejection: Secondary | ICD-10-CM | POA: Diagnosis not present

## 2020-12-31 DIAGNOSIS — D849 Immunodeficiency, unspecified: Secondary | ICD-10-CM | POA: Diagnosis not present

## 2021-01-03 DIAGNOSIS — L570 Actinic keratosis: Secondary | ICD-10-CM | POA: Diagnosis not present

## 2021-01-07 DIAGNOSIS — Z942 Lung transplant status: Secondary | ICD-10-CM | POA: Diagnosis not present

## 2021-01-07 DIAGNOSIS — Z298 Encounter for other specified prophylactic measures: Secondary | ICD-10-CM | POA: Diagnosis not present

## 2021-01-07 DIAGNOSIS — T86819 Unspecified complication of lung transplant: Secondary | ICD-10-CM | POA: Diagnosis not present

## 2021-01-07 DIAGNOSIS — Z79899 Other long term (current) drug therapy: Secondary | ICD-10-CM | POA: Diagnosis not present

## 2021-01-15 DIAGNOSIS — M25562 Pain in left knee: Secondary | ICD-10-CM | POA: Diagnosis not present

## 2021-01-15 DIAGNOSIS — M1712 Unilateral primary osteoarthritis, left knee: Secondary | ICD-10-CM | POA: Diagnosis not present

## 2021-01-15 DIAGNOSIS — M25561 Pain in right knee: Secondary | ICD-10-CM | POA: Diagnosis not present

## 2021-01-15 DIAGNOSIS — M1711 Unilateral primary osteoarthritis, right knee: Secondary | ICD-10-CM | POA: Diagnosis not present

## 2021-01-29 DIAGNOSIS — Z942 Lung transplant status: Secondary | ICD-10-CM | POA: Diagnosis not present

## 2021-01-29 DIAGNOSIS — T86819 Unspecified complication of lung transplant: Secondary | ICD-10-CM | POA: Diagnosis not present

## 2021-01-29 DIAGNOSIS — Z79899 Other long term (current) drug therapy: Secondary | ICD-10-CM | POA: Diagnosis not present

## 2021-01-29 DIAGNOSIS — Z298 Encounter for other specified prophylactic measures: Secondary | ICD-10-CM | POA: Diagnosis not present

## 2021-02-05 DIAGNOSIS — Z8616 Personal history of COVID-19: Secondary | ICD-10-CM | POA: Diagnosis not present

## 2021-02-05 DIAGNOSIS — K219 Gastro-esophageal reflux disease without esophagitis: Secondary | ICD-10-CM | POA: Diagnosis not present

## 2021-02-05 DIAGNOSIS — Z942 Lung transplant status: Secondary | ICD-10-CM | POA: Diagnosis not present

## 2021-02-05 DIAGNOSIS — I10 Essential (primary) hypertension: Secondary | ICD-10-CM | POA: Diagnosis not present

## 2021-02-05 DIAGNOSIS — Z01812 Encounter for preprocedural laboratory examination: Secondary | ICD-10-CM | POA: Diagnosis not present

## 2021-02-05 DIAGNOSIS — J84112 Idiopathic pulmonary fibrosis: Secondary | ICD-10-CM | POA: Diagnosis not present

## 2021-02-05 DIAGNOSIS — M1711 Unilateral primary osteoarthritis, right knee: Secondary | ICD-10-CM | POA: Diagnosis not present

## 2021-02-05 DIAGNOSIS — E785 Hyperlipidemia, unspecified: Secondary | ICD-10-CM | POA: Diagnosis not present

## 2021-02-05 DIAGNOSIS — R9431 Abnormal electrocardiogram [ECG] [EKG]: Secondary | ICD-10-CM | POA: Diagnosis not present

## 2021-02-05 DIAGNOSIS — D649 Anemia, unspecified: Secondary | ICD-10-CM | POA: Diagnosis not present

## 2021-02-05 DIAGNOSIS — C4492 Squamous cell carcinoma of skin, unspecified: Secondary | ICD-10-CM | POA: Diagnosis not present

## 2021-02-05 DIAGNOSIS — T380X5A Adverse effect of glucocorticoids and synthetic analogues, initial encounter: Secondary | ICD-10-CM | POA: Diagnosis not present

## 2021-02-05 DIAGNOSIS — Z01810 Encounter for preprocedural cardiovascular examination: Secondary | ICD-10-CM | POA: Diagnosis not present

## 2021-02-05 DIAGNOSIS — R739 Hyperglycemia, unspecified: Secondary | ICD-10-CM | POA: Diagnosis not present

## 2021-02-05 DIAGNOSIS — Z01818 Encounter for other preprocedural examination: Secondary | ICD-10-CM | POA: Diagnosis not present

## 2021-02-06 DIAGNOSIS — M1711 Unilateral primary osteoarthritis, right knee: Secondary | ICD-10-CM | POA: Diagnosis not present

## 2021-02-13 DIAGNOSIS — M1611 Unilateral primary osteoarthritis, right hip: Secondary | ICD-10-CM | POA: Diagnosis not present

## 2021-02-13 DIAGNOSIS — M1711 Unilateral primary osteoarthritis, right knee: Secondary | ICD-10-CM | POA: Diagnosis not present

## 2021-02-13 DIAGNOSIS — M25461 Effusion, right knee: Secondary | ICD-10-CM | POA: Diagnosis not present

## 2021-02-13 DIAGNOSIS — Z01818 Encounter for other preprocedural examination: Secondary | ICD-10-CM | POA: Diagnosis not present

## 2021-02-19 DIAGNOSIS — T86819 Unspecified complication of lung transplant: Secondary | ICD-10-CM | POA: Diagnosis not present

## 2021-02-19 DIAGNOSIS — Z298 Encounter for other specified prophylactic measures: Secondary | ICD-10-CM | POA: Diagnosis not present

## 2021-02-19 DIAGNOSIS — Z942 Lung transplant status: Secondary | ICD-10-CM | POA: Diagnosis not present

## 2021-02-19 DIAGNOSIS — Z79899 Other long term (current) drug therapy: Secondary | ICD-10-CM | POA: Diagnosis not present

## 2021-02-20 DIAGNOSIS — Z942 Lung transplant status: Secondary | ICD-10-CM | POA: Diagnosis not present

## 2021-02-20 DIAGNOSIS — K219 Gastro-esophageal reflux disease without esophagitis: Secondary | ICD-10-CM | POA: Diagnosis not present

## 2021-02-20 DIAGNOSIS — Z471 Aftercare following joint replacement surgery: Secondary | ICD-10-CM | POA: Diagnosis not present

## 2021-02-20 DIAGNOSIS — Z86010 Personal history of colonic polyps: Secondary | ICD-10-CM | POA: Diagnosis not present

## 2021-02-20 DIAGNOSIS — G8918 Other acute postprocedural pain: Secondary | ICD-10-CM | POA: Diagnosis not present

## 2021-02-20 DIAGNOSIS — I2510 Atherosclerotic heart disease of native coronary artery without angina pectoris: Secondary | ICD-10-CM | POA: Diagnosis not present

## 2021-02-20 DIAGNOSIS — M25761 Osteophyte, right knee: Secondary | ICD-10-CM | POA: Diagnosis not present

## 2021-02-20 DIAGNOSIS — N183 Chronic kidney disease, stage 3 unspecified: Secondary | ICD-10-CM | POA: Diagnosis not present

## 2021-02-20 DIAGNOSIS — Z886 Allergy status to analgesic agent status: Secondary | ICD-10-CM | POA: Diagnosis not present

## 2021-02-20 DIAGNOSIS — I97191 Other postprocedural cardiac functional disturbances following other surgery: Secondary | ICD-10-CM | POA: Diagnosis not present

## 2021-02-20 DIAGNOSIS — Z7982 Long term (current) use of aspirin: Secondary | ICD-10-CM | POA: Diagnosis not present

## 2021-02-20 DIAGNOSIS — Z8616 Personal history of COVID-19: Secondary | ICD-10-CM | POA: Diagnosis not present

## 2021-02-20 DIAGNOSIS — Z96651 Presence of right artificial knee joint: Secondary | ICD-10-CM | POA: Diagnosis not present

## 2021-02-20 DIAGNOSIS — I4891 Unspecified atrial fibrillation: Secondary | ICD-10-CM | POA: Diagnosis not present

## 2021-02-20 DIAGNOSIS — Z7952 Long term (current) use of systemic steroids: Secondary | ICD-10-CM | POA: Diagnosis not present

## 2021-02-20 DIAGNOSIS — Z79899 Other long term (current) drug therapy: Secondary | ICD-10-CM | POA: Diagnosis not present

## 2021-02-20 DIAGNOSIS — I129 Hypertensive chronic kidney disease with stage 1 through stage 4 chronic kidney disease, or unspecified chronic kidney disease: Secondary | ICD-10-CM | POA: Diagnosis not present

## 2021-02-20 DIAGNOSIS — E785 Hyperlipidemia, unspecified: Secondary | ICD-10-CM | POA: Diagnosis not present

## 2021-02-20 DIAGNOSIS — M1711 Unilateral primary osteoarthritis, right knee: Secondary | ICD-10-CM | POA: Diagnosis not present

## 2021-02-21 DIAGNOSIS — M25761 Osteophyte, right knee: Secondary | ICD-10-CM | POA: Diagnosis not present

## 2021-02-21 DIAGNOSIS — M1711 Unilateral primary osteoarthritis, right knee: Secondary | ICD-10-CM | POA: Diagnosis not present

## 2021-02-21 DIAGNOSIS — Z942 Lung transplant status: Secondary | ICD-10-CM | POA: Diagnosis not present

## 2021-02-21 DIAGNOSIS — E785 Hyperlipidemia, unspecified: Secondary | ICD-10-CM | POA: Diagnosis not present

## 2021-02-21 DIAGNOSIS — Z886 Allergy status to analgesic agent status: Secondary | ICD-10-CM | POA: Diagnosis not present

## 2021-02-21 DIAGNOSIS — N183 Chronic kidney disease, stage 3 unspecified: Secondary | ICD-10-CM | POA: Diagnosis not present

## 2021-02-21 DIAGNOSIS — K219 Gastro-esophageal reflux disease without esophagitis: Secondary | ICD-10-CM | POA: Diagnosis not present

## 2021-02-21 DIAGNOSIS — Z7952 Long term (current) use of systemic steroids: Secondary | ICD-10-CM | POA: Diagnosis not present

## 2021-02-21 DIAGNOSIS — I4891 Unspecified atrial fibrillation: Secondary | ICD-10-CM | POA: Diagnosis not present

## 2021-02-21 DIAGNOSIS — I2510 Atherosclerotic heart disease of native coronary artery without angina pectoris: Secondary | ICD-10-CM | POA: Diagnosis not present

## 2021-02-21 DIAGNOSIS — Z79899 Other long term (current) drug therapy: Secondary | ICD-10-CM | POA: Diagnosis not present

## 2021-02-21 DIAGNOSIS — I97191 Other postprocedural cardiac functional disturbances following other surgery: Secondary | ICD-10-CM | POA: Diagnosis not present

## 2021-02-21 DIAGNOSIS — Z8616 Personal history of COVID-19: Secondary | ICD-10-CM | POA: Diagnosis not present

## 2021-02-21 DIAGNOSIS — Z86010 Personal history of colonic polyps: Secondary | ICD-10-CM | POA: Diagnosis not present

## 2021-02-21 DIAGNOSIS — I129 Hypertensive chronic kidney disease with stage 1 through stage 4 chronic kidney disease, or unspecified chronic kidney disease: Secondary | ICD-10-CM | POA: Diagnosis not present

## 2021-02-22 DIAGNOSIS — N183 Chronic kidney disease, stage 3 unspecified: Secondary | ICD-10-CM | POA: Diagnosis not present

## 2021-02-22 DIAGNOSIS — Z471 Aftercare following joint replacement surgery: Secondary | ICD-10-CM | POA: Diagnosis not present

## 2021-02-22 DIAGNOSIS — I129 Hypertensive chronic kidney disease with stage 1 through stage 4 chronic kidney disease, or unspecified chronic kidney disease: Secondary | ICD-10-CM | POA: Diagnosis not present

## 2021-02-22 DIAGNOSIS — M1712 Unilateral primary osteoarthritis, left knee: Secondary | ICD-10-CM | POA: Diagnosis not present

## 2021-02-22 DIAGNOSIS — Z96651 Presence of right artificial knee joint: Secondary | ICD-10-CM | POA: Diagnosis not present

## 2021-02-25 DIAGNOSIS — Z471 Aftercare following joint replacement surgery: Secondary | ICD-10-CM | POA: Diagnosis not present

## 2021-02-25 DIAGNOSIS — I129 Hypertensive chronic kidney disease with stage 1 through stage 4 chronic kidney disease, or unspecified chronic kidney disease: Secondary | ICD-10-CM | POA: Diagnosis not present

## 2021-02-25 DIAGNOSIS — M1712 Unilateral primary osteoarthritis, left knee: Secondary | ICD-10-CM | POA: Diagnosis not present

## 2021-02-25 DIAGNOSIS — Z96651 Presence of right artificial knee joint: Secondary | ICD-10-CM | POA: Diagnosis not present

## 2021-02-25 DIAGNOSIS — N183 Chronic kidney disease, stage 3 unspecified: Secondary | ICD-10-CM | POA: Diagnosis not present

## 2021-02-28 DIAGNOSIS — Z886 Allergy status to analgesic agent status: Secondary | ICD-10-CM | POA: Diagnosis not present

## 2021-02-28 DIAGNOSIS — Z85828 Personal history of other malignant neoplasm of skin: Secondary | ICD-10-CM | POA: Diagnosis not present

## 2021-02-28 DIAGNOSIS — M7989 Other specified soft tissue disorders: Secondary | ICD-10-CM | POA: Diagnosis not present

## 2021-02-28 DIAGNOSIS — K219 Gastro-esophageal reflux disease without esophagitis: Secondary | ICD-10-CM | POA: Diagnosis not present

## 2021-02-28 DIAGNOSIS — Z7901 Long term (current) use of anticoagulants: Secondary | ICD-10-CM | POA: Diagnosis not present

## 2021-02-28 DIAGNOSIS — N183 Chronic kidney disease, stage 3 unspecified: Secondary | ICD-10-CM | POA: Diagnosis not present

## 2021-02-28 DIAGNOSIS — M1712 Unilateral primary osteoarthritis, left knee: Secondary | ICD-10-CM | POA: Diagnosis not present

## 2021-02-28 DIAGNOSIS — I129 Hypertensive chronic kidney disease with stage 1 through stage 4 chronic kidney disease, or unspecified chronic kidney disease: Secondary | ICD-10-CM | POA: Diagnosis not present

## 2021-02-28 DIAGNOSIS — M79661 Pain in right lower leg: Secondary | ICD-10-CM | POA: Diagnosis not present

## 2021-02-28 DIAGNOSIS — I2510 Atherosclerotic heart disease of native coronary artery without angina pectoris: Secondary | ICD-10-CM | POA: Diagnosis not present

## 2021-02-28 DIAGNOSIS — R600 Localized edema: Secondary | ICD-10-CM | POA: Diagnosis not present

## 2021-02-28 DIAGNOSIS — Z96651 Presence of right artificial knee joint: Secondary | ICD-10-CM | POA: Diagnosis not present

## 2021-02-28 DIAGNOSIS — Z79899 Other long term (current) drug therapy: Secondary | ICD-10-CM | POA: Diagnosis not present

## 2021-02-28 DIAGNOSIS — I10 Essential (primary) hypertension: Secondary | ICD-10-CM | POA: Diagnosis not present

## 2021-02-28 DIAGNOSIS — E785 Hyperlipidemia, unspecified: Secondary | ICD-10-CM | POA: Diagnosis not present

## 2021-02-28 DIAGNOSIS — G8918 Other acute postprocedural pain: Secondary | ICD-10-CM | POA: Diagnosis not present

## 2021-02-28 DIAGNOSIS — Z8616 Personal history of COVID-19: Secondary | ICD-10-CM | POA: Diagnosis not present

## 2021-02-28 DIAGNOSIS — Z471 Aftercare following joint replacement surgery: Secondary | ICD-10-CM | POA: Diagnosis not present

## 2021-03-04 DIAGNOSIS — L821 Other seborrheic keratosis: Secondary | ICD-10-CM | POA: Diagnosis not present

## 2021-03-04 DIAGNOSIS — Z85828 Personal history of other malignant neoplasm of skin: Secondary | ICD-10-CM | POA: Diagnosis not present

## 2021-03-04 DIAGNOSIS — L814 Other melanin hyperpigmentation: Secondary | ICD-10-CM | POA: Diagnosis not present

## 2021-03-04 DIAGNOSIS — D225 Melanocytic nevi of trunk: Secondary | ICD-10-CM | POA: Diagnosis not present

## 2021-03-04 DIAGNOSIS — L570 Actinic keratosis: Secondary | ICD-10-CM | POA: Diagnosis not present

## 2021-03-04 DIAGNOSIS — L579 Skin changes due to chronic exposure to nonionizing radiation, unspecified: Secondary | ICD-10-CM | POA: Diagnosis not present

## 2021-03-05 DIAGNOSIS — Z471 Aftercare following joint replacement surgery: Secondary | ICD-10-CM | POA: Diagnosis not present

## 2021-03-05 DIAGNOSIS — N183 Chronic kidney disease, stage 3 unspecified: Secondary | ICD-10-CM | POA: Diagnosis not present

## 2021-03-05 DIAGNOSIS — M1712 Unilateral primary osteoarthritis, left knee: Secondary | ICD-10-CM | POA: Diagnosis not present

## 2021-03-05 DIAGNOSIS — Z96651 Presence of right artificial knee joint: Secondary | ICD-10-CM | POA: Diagnosis not present

## 2021-03-05 DIAGNOSIS — I129 Hypertensive chronic kidney disease with stage 1 through stage 4 chronic kidney disease, or unspecified chronic kidney disease: Secondary | ICD-10-CM | POA: Diagnosis not present

## 2021-03-06 DIAGNOSIS — T86819 Unspecified complication of lung transplant: Secondary | ICD-10-CM | POA: Diagnosis not present

## 2021-03-06 DIAGNOSIS — Z298 Encounter for other specified prophylactic measures: Secondary | ICD-10-CM | POA: Diagnosis not present

## 2021-03-06 DIAGNOSIS — Z79899 Other long term (current) drug therapy: Secondary | ICD-10-CM | POA: Diagnosis not present

## 2021-03-06 DIAGNOSIS — Z942 Lung transplant status: Secondary | ICD-10-CM | POA: Diagnosis not present

## 2021-03-08 DIAGNOSIS — I129 Hypertensive chronic kidney disease with stage 1 through stage 4 chronic kidney disease, or unspecified chronic kidney disease: Secondary | ICD-10-CM | POA: Diagnosis not present

## 2021-03-08 DIAGNOSIS — Z471 Aftercare following joint replacement surgery: Secondary | ICD-10-CM | POA: Diagnosis not present

## 2021-03-08 DIAGNOSIS — N183 Chronic kidney disease, stage 3 unspecified: Secondary | ICD-10-CM | POA: Diagnosis not present

## 2021-03-08 DIAGNOSIS — M1712 Unilateral primary osteoarthritis, left knee: Secondary | ICD-10-CM | POA: Diagnosis not present

## 2021-03-08 DIAGNOSIS — Z96651 Presence of right artificial knee joint: Secondary | ICD-10-CM | POA: Diagnosis not present

## 2021-03-11 DIAGNOSIS — M1712 Unilateral primary osteoarthritis, left knee: Secondary | ICD-10-CM | POA: Diagnosis not present

## 2021-03-11 DIAGNOSIS — N183 Chronic kidney disease, stage 3 unspecified: Secondary | ICD-10-CM | POA: Diagnosis not present

## 2021-03-11 DIAGNOSIS — Z471 Aftercare following joint replacement surgery: Secondary | ICD-10-CM | POA: Diagnosis not present

## 2021-03-11 DIAGNOSIS — I129 Hypertensive chronic kidney disease with stage 1 through stage 4 chronic kidney disease, or unspecified chronic kidney disease: Secondary | ICD-10-CM | POA: Diagnosis not present

## 2021-03-11 DIAGNOSIS — Z96651 Presence of right artificial knee joint: Secondary | ICD-10-CM | POA: Diagnosis not present

## 2021-03-13 DIAGNOSIS — Z96651 Presence of right artificial knee joint: Secondary | ICD-10-CM | POA: Diagnosis not present

## 2021-03-13 DIAGNOSIS — Z471 Aftercare following joint replacement surgery: Secondary | ICD-10-CM | POA: Diagnosis not present

## 2021-03-13 DIAGNOSIS — I129 Hypertensive chronic kidney disease with stage 1 through stage 4 chronic kidney disease, or unspecified chronic kidney disease: Secondary | ICD-10-CM | POA: Diagnosis not present

## 2021-03-13 DIAGNOSIS — M1712 Unilateral primary osteoarthritis, left knee: Secondary | ICD-10-CM | POA: Diagnosis not present

## 2021-03-13 DIAGNOSIS — N183 Chronic kidney disease, stage 3 unspecified: Secondary | ICD-10-CM | POA: Diagnosis not present

## 2021-03-19 DIAGNOSIS — I129 Hypertensive chronic kidney disease with stage 1 through stage 4 chronic kidney disease, or unspecified chronic kidney disease: Secondary | ICD-10-CM | POA: Diagnosis not present

## 2021-03-19 DIAGNOSIS — Z471 Aftercare following joint replacement surgery: Secondary | ICD-10-CM | POA: Diagnosis not present

## 2021-03-19 DIAGNOSIS — M1712 Unilateral primary osteoarthritis, left knee: Secondary | ICD-10-CM | POA: Diagnosis not present

## 2021-03-19 DIAGNOSIS — Z96651 Presence of right artificial knee joint: Secondary | ICD-10-CM | POA: Diagnosis not present

## 2021-03-19 DIAGNOSIS — N183 Chronic kidney disease, stage 3 unspecified: Secondary | ICD-10-CM | POA: Diagnosis not present

## 2021-03-21 DIAGNOSIS — M1712 Unilateral primary osteoarthritis, left knee: Secondary | ICD-10-CM | POA: Diagnosis not present

## 2021-03-21 DIAGNOSIS — I129 Hypertensive chronic kidney disease with stage 1 through stage 4 chronic kidney disease, or unspecified chronic kidney disease: Secondary | ICD-10-CM | POA: Diagnosis not present

## 2021-03-21 DIAGNOSIS — N183 Chronic kidney disease, stage 3 unspecified: Secondary | ICD-10-CM | POA: Diagnosis not present

## 2021-03-21 DIAGNOSIS — Z96651 Presence of right artificial knee joint: Secondary | ICD-10-CM | POA: Diagnosis not present

## 2021-03-21 DIAGNOSIS — Z471 Aftercare following joint replacement surgery: Secondary | ICD-10-CM | POA: Diagnosis not present

## 2021-03-22 DIAGNOSIS — M25561 Pain in right knee: Secondary | ICD-10-CM | POA: Diagnosis not present

## 2021-03-26 ENCOUNTER — Ambulatory Visit: Payer: Medicare Other | Attending: Orthopedic Surgery | Admitting: Physical Therapy

## 2021-03-26 ENCOUNTER — Encounter: Payer: Self-pay | Admitting: Physical Therapy

## 2021-03-26 ENCOUNTER — Other Ambulatory Visit: Payer: Self-pay

## 2021-03-26 DIAGNOSIS — R600 Localized edema: Secondary | ICD-10-CM

## 2021-03-26 DIAGNOSIS — M25561 Pain in right knee: Secondary | ICD-10-CM | POA: Insufficient documentation

## 2021-03-26 DIAGNOSIS — M25661 Stiffness of right knee, not elsewhere classified: Secondary | ICD-10-CM

## 2021-03-26 DIAGNOSIS — G8929 Other chronic pain: Secondary | ICD-10-CM

## 2021-03-26 NOTE — Therapy (Signed)
[facility] [place], [place], [address] Phone: [phone]   Fax:  [phone]  Physical Therapy Evaluation  Patient Details  Name: Vernon Richard MRN: [id number] Date of Birth: [birth year] Referring Provider (PT): Tacey Ruiz MD   Encounter Date: 03/26/2021   PT End of Session - 03/26/21 1110    Visit Number 1    Number of Visits 12    Date for PT Re-Evaluation 06/24/21    Authorization Type FOTO AT LEAST EVERY 5TH VISIT.  PROGRESS NOTE AT 10TH VISIT.  KX MODIFIER AFTER 15 VISITS.    PT Start Time 0900    PT Stop Time 0945    PT Time Calculation (min) 45 min    Activity Tolerance Patient tolerated treatment well    Behavior During Therapy WFL for tasks assessed/performed           Past Medical History:  Diagnosis Date  . Arthritis    OA of both knees  . Heart murmur, systolic   . Hyperlipidemia   . Hypertension   . Pulmonary fibrosis ([place]) 05/03/2018    Past Surgical History:  Procedure Laterality Date  . CATARACT EXTRACTION, BILATERAL    . HERNIA REPAIR     groin  . LUNG TRANSPLANT, SINGLE Right 09/15/2018    Vitals:   03/26/21 0935  SpO2: 97%      Subjective Assessment - 03/26/21 0930    Subjective COVID-19 screen performed prior to patient entering clinic.  The patient presents to the clinic today s/p right total knee replacement performed on 02/20/21.  He had home health PT and is compliant to his HEP.  He was without his TED hose today and it was recommnended that he wear them.  His pain-level today is a 4/10.  He has been icing at home to decrease his pain.  He is walking with a straight cane.    Pertinent History Pulmonary fibrosis, hernia repair, Right lung transplant, OA, h/o left knee pain.    How long can you walk comfortably? Walk around home.    Patient Stated Goals Walk for exercise.    Currently in Pain? Yes    Pain Score 4     Pain Location Knee    Pain Orientation Right    Pain  Descriptors / Indicators Sore    Pain Type Surgical pain    Pain Onset More than a month ago    Pain Frequency Constant    Aggravating Factors  Too much activity.    Pain Relieving Factors Ice.              [facility] PT Assessment - 03/26/21 0001      Assessment   Medical Diagnosis Right total knee replacement.    Referring Provider (PT) Tacey Ruiz MD    Onset Date/Surgical Date 02/20/21      Precautions   Precaution Comments No ultrasound.      Restrictions   Weight Bearing Restrictions No      Balance Screen   Has the patient fallen in the past 6 months No    Has the patient had a decrease in activity level because of a fear of falling?  No    Is the patient reluctant to leave their home because of a fear of falling?  No      Home [employer] residence      Prior Function   Level of Independence Independent      Observation/Other  Assessments   Focus on Therapeutic Outcomes (FOTO)  Complete.      Observation/Other Assessments-Edema    Edema Circumferential      Circumferential Edema   Circumferential - Right RT 2 cms > LT.      ROM / Strength   AROM / PROM / Strength AROM;Strength      AROM   Overall AROM Comments Right knee active extension is -2 degrees with active flexion to 110 degrees and passive to 114 degrees.      Strength   Overall Strength Comments Right hip abduction is 4/5, right knee extension is 4+/5 with right quadriceps atrophy observed.      Palpation   Palpation comment Mild right anterior knee pain.      Ambulation/Gait   Gait Comments Trendelenburg type gait with patient using a straight cane.                      Objective measurements completed on examination: See above findings.       [facility] Adult PT Treatment/Exercise - 03/26/21 0001      Modalities   Modalities Vasopneumatic      Vasopneumatic   Number Minutes Vasopneumatic  15 minutes    Vasopnuematic Location  --   Right knee.                       PT Long Term Goals - 03/26/21 1112      PT LONG TERM GOAL #1   Title Independent with a HEP.    Time 4    Period Weeks    Status New      PT LONG TERM GOAL #2   Title Full right active knee extension in order to normalize gait.    Time 4    Period Weeks    Status New      PT LONG TERM GOAL #3   Title Active knee flexion to 125 degrees+ so the patient can perform functional tasks and do so with pain not > 2-3/10.    Time 4    Period Weeks    Status New      PT LONG TERM GOAL #4   Title Increase right hip and knee strength to a solid 4+/5 to provide good stability for accomplishment of functional activities.    Time 4    Period Weeks    Status New      PT LONG TERM GOAL #5   Title Perform a reciprocating stair gait with one railing with pain not > 2-3/10.    Time 4    Period Weeks    Status New                  Plan - 03/26/21 1104    Clinical Impression Statement The patient presents to OPPT s/p right total knee replacement performed on 02/20/21.  He is very pleased with his progress thus far.  He lacks some right knee flexion and extension.  His edema was miniaml today.  Recommended he use his TED hose.  He has some right quadriceps atrophy.  He is walking with a Trendelenburg type gait pattern using a straight cane.  Patient will benefit from skilled physical therapy intervention to address pain and deficits.    Personal Factors and Comorbidities Comorbidity 1;Comorbidity 2;Other    Comorbidities Pulmonary fibrosis, hernia repair, Right lung transplant, OA, h/o left knee pain.    Examination-Activity Limitations Locomotion Level;Other  Examination-Participation Restrictions Other    Stability/Clinical Decision Making Stable/Uncomplicated    Clinical Decision Making Low    Rehab Potential Excellent    PT Frequency 3x / week    PT Duration 4 weeks    PT Treatment/Interventions ADLs/Self Care Home Management;Cryotherapy;Electrical  Stimulation;Moist Heat;Gait training;Stair training;Functional mobility training;Therapeutic activities;Therapeutic exercise;Neuromuscular re-education;Manual techniques;Patient/family education;Passive range of motion;Vasopneumatic Device    PT Next Visit Plan Nustep with progression to recumbent bike, PRE's.  Vasopneumatic.    Consulted and Agree with Plan of Care Patient           Patient will benefit from skilled therapeutic intervention in order to improve the following deficits and impairments:  Decreased activity tolerance,Decreased range of motion,Decreased strength,Increased edema,Pain  Visit Diagnosis: Chronic pain of right knee - Plan: PT plan of care cert/re-cert  Stiffness of right knee, not elsewhere classified - Plan: PT plan of care cert/re-cert  Localized edema - Plan: PT plan of care cert/re-cert     Problem List Patient Active Problem List   Diagnosis Date Noted  . Osteopenia of multiple sites 07/06/2020  . 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 Dry eye syndrome of both eyes 03/01/2019  . CKD (chronic kidney disease) stage 3, GFR 30-59 ml/min (HCC) 10/22/2018  . Immunosuppression ([place]) 10/11/2018  . Status post lung transplantation ([place]) 09/16/2018  . Bilateral posterior capsular opacification 07/16/2018  . Interstitial lung disease ([place]) 06/25/2018  . CAD (coronary artery disease) 09/21/2017  . NSIP (nonspecific interstitial pneumonia) ([place]) 12/18/2016  . Osteoarthritis of knee 03/13/2015  . Heart murmur, systolic   . Arthritis   . Essential hypertension 01/07/2011  . Hyperlipidemia 01/07/2011    Evalyse Stroope, [name]  MPT 03/26/2021, 11:16 AM  [facility] [address] [place], [place], [address] Phone: [phone]   Fax:  [phone]  Name: TOMY KHIM MRN: [id number] Date of Birth: [birth year]

## 2021-03-27 DIAGNOSIS — Z942 Lung transplant status: Secondary | ICD-10-CM | POA: Diagnosis not present

## 2021-03-27 DIAGNOSIS — T86819 Unspecified complication of lung transplant: Secondary | ICD-10-CM | POA: Diagnosis not present

## 2021-03-27 DIAGNOSIS — Z79899 Other long term (current) drug therapy: Secondary | ICD-10-CM | POA: Diagnosis not present

## 2021-03-27 DIAGNOSIS — Z298 Encounter for other specified prophylactic measures: Secondary | ICD-10-CM | POA: Diagnosis not present

## 2021-03-28 ENCOUNTER — Ambulatory Visit: Payer: Medicare Other | Admitting: *Deleted

## 2021-03-28 ENCOUNTER — Other Ambulatory Visit: Payer: Self-pay

## 2021-03-28 DIAGNOSIS — M25661 Stiffness of right knee, not elsewhere classified: Secondary | ICD-10-CM

## 2021-03-28 DIAGNOSIS — R600 Localized edema: Secondary | ICD-10-CM | POA: Diagnosis not present

## 2021-03-28 DIAGNOSIS — G8929 Other chronic pain: Secondary | ICD-10-CM | POA: Diagnosis not present

## 2021-03-28 DIAGNOSIS — M25561 Pain in right knee: Secondary | ICD-10-CM | POA: Diagnosis not present

## 2021-03-28 NOTE — Therapy (Signed)
[facility] [place], [place], [address] Phone: [phone]   Fax:  [phone]  Physical Therapy Treatment  Patient Details  Name: Vernon Richard MRN: [id number] Date of Birth: [birth year] Referring Provider (PT): Tacey Ruiz MD   Encounter Date: 03/28/2021   PT End of Session - 03/28/21 1654     Visit Number 2    Number of Visits 12    Date for PT Re-Evaluation 06/24/21    Authorization Type FOTO AT LEAST EVERY 5TH VISIT.  PROGRESS NOTE AT 10TH VISIT.  KX MODIFIER AFTER 15 VISITS.    PT Start Time 1430    PT Stop Time 1520    PT Time Calculation (min) 50 min             Past Medical History:  Diagnosis Date   Arthritis    OA of both knees   Heart murmur, systolic    Hyperlipidemia    Hypertension    Pulmonary fibrosis ([place]) 05/03/2018    Past Surgical History:  Procedure Laterality Date   CATARACT EXTRACTION, BILATERAL     HERNIA REPAIR     groin   LUNG TRANSPLANT, SINGLE Right 09/15/2018    There were no vitals filed for this visit.   Subjective Assessment - 03/28/21 1441     Subjective COVID-19 screen performed prior to patient entering clinic.  The patient presents to the clinic today s/p right total knee replacement performed on 02/20/21. 4/10 today    Pertinent History Pulmonary fibrosis, hernia repair, Right lung transplant, OA, h/o left knee pain.    How long can you walk comfortably? Walk around home.    Patient Stated Goals Walk for exercise.    Currently in Pain? Yes    Pain Score 4     Pain Location Knee                               [facility] Adult PT Treatment/Exercise - 03/28/21 0001       Exercises   Exercises Knee/Hip      Knee/Hip Exercises: Aerobic   Nustep L1 x 15 mins with seat moved forward x 2      Knee/Hip Exercises: Standing   Rocker Board 3 minutes   DF/ PF calf stretch   Rocker Board Limitations decreased standing exs today due to RT hip ppain       Knee/Hip Exercises: Seated   Long Arc Quad Strengthening;Right;2 sets;15 reps    Long Arc Quad Weight 2 lbs.      Knee/Hip Exercises: Supine   Bridges AROM;2 sets;10 reps      Modalities   Modalities Vasopneumatic      Vasopneumatic   Number Minutes Vasopneumatic  15 minutes    Vasopnuematic Location  --   Right knee.   Vasopneumatic Pressure Low    Vasopneumatic Temperature  pain/edema      Manual Therapy   Manual Therapy Passive ROM    Passive ROM Passive extension with Pt supine                         PT Long Term Goals - 03/26/21 1112       PT LONG TERM GOAL #1   Title Independent with a HEP.    Time 4    Period Weeks    Status New      PT LONG TERM GOAL #2  Title Full right active knee extension in order to normalize gait.    Time 4    Period Weeks    Status New      PT LONG TERM GOAL #3   Title Active knee flexion to 125 degrees+ so the patient can perform functional tasks and do so with pain not > 2-3/10.    Time 4    Period Weeks    Status New      PT LONG TERM GOAL #4   Title Increase right hip and knee strength to a solid 4+/5 to provide good stability for accomplishment of functional activities.    Time 4    Period Weeks    Status New      PT LONG TERM GOAL #5   Title Perform a reciprocating stair gait with one railing with pain not > 2-3/10.    Time 4    Period Weeks    Status New                   Plan - 03/28/21 1658     Clinical Impression Statement Pt arrived today doing fairly well with low pain levels RT knee, but more in RT hip. Limited standing exs today due to RT hip pain, but did well with Rx. Normal vaso end of Rx    Personal Factors and Comorbidities Comorbidity 1;Comorbidity 2;Other    Comorbidities Pulmonary fibrosis, hernia repair, Right lung transplant, OA, h/o left knee pain.    Examination-Activity Limitations Locomotion Level;Other    Rehab Potential Excellent    PT Frequency 3x / week    PT  Duration 4 weeks    PT Treatment/Interventions ADLs/Self Care Home Management;Cryotherapy;Electrical Stimulation;Moist Heat;Gait training;Stair training;Functional mobility training;Therapeutic activities;Therapeutic exercise;Neuromuscular re-education;Manual techniques;Patient/family education;Passive range of motion;Vasopneumatic Device    PT Next Visit Plan Nustep with progression to recumbent bike, PRE's.  Vasopneumatic.    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[place]) 07/21/2019   Dry eye syndrome of both eyes 03/01/2019   CKD (chronic kidney disease) stage 3, GFR 30-59 ml/min (HCC) 10/22/2018   Immunosuppression ([place]) 10/11/2018   Status post lung transplantation ([place]) 09/16/2018   Bilateral posterior capsular opacification 07/16/2018   Interstitial lung disease ([place]) 06/25/2018   CAD (coronary artery disease) 09/21/2017   NSIP (nonspecific interstitial pneumonia) ([place]) 12/18/2016   Osteoarthritis of knee 03/13/2015   Heart murmur, systolic    Arthritis    Essential hypertension 01/07/2011   Hyperlipidemia 01/07/2011    Tayjon Halladay,Vernon Richard, PTA 03/28/2021, 5:02 PM  [facility] [address] [place], [place], [address] Phone: [phone]   Fax:  [phone]  Name: Vernon Richard MRN: [id number] Date of Birth: 26-May-1951

## 2021-04-02 ENCOUNTER — Ambulatory Visit: Payer: Medicare Other | Admitting: Physical Therapy

## 2021-04-02 ENCOUNTER — Other Ambulatory Visit: Payer: Self-pay

## 2021-04-02 ENCOUNTER — Encounter: Payer: Self-pay | Admitting: Physical Therapy

## 2021-04-02 DIAGNOSIS — G8929 Other chronic pain: Secondary | ICD-10-CM

## 2021-04-02 DIAGNOSIS — M25661 Stiffness of right knee, not elsewhere classified: Secondary | ICD-10-CM

## 2021-04-02 DIAGNOSIS — M25561 Pain in right knee: Secondary | ICD-10-CM | POA: Diagnosis not present

## 2021-04-02 DIAGNOSIS — R600 Localized edema: Secondary | ICD-10-CM | POA: Diagnosis not present

## 2021-04-02 NOTE — Therapy (Signed)
[facility] [place], [place], [address] Phone: [phone]   Fax:  [phone]  Physical Therapy Treatment  Patient Details  Name: Vernon Richard MRN: [id number] Date of Birth: June 15, 1951 Referring Provider (PT): Tacey Ruiz MD   Encounter Date: 04/02/2021   PT End of Session - 04/02/21 1424     Visit Number 3    Number of Visits 12    Date for PT Re-Evaluation 06/24/21    Authorization Type FOTO AT LEAST EVERY 5TH VISIT.  PROGRESS NOTE AT 10TH VISIT.  KX MODIFIER AFTER 15 VISITS.    PT Start Time 0145    PT Stop Time 0236    PT Time Calculation (min) 51 min    Activity Tolerance Patient tolerated treatment well    Behavior During Therapy WFL for tasks assessed/performed             Past Medical History:  Diagnosis Date   Arthritis    OA of both knees   Heart murmur, systolic    Hyperlipidemia    Hypertension    Pulmonary fibrosis ([place]) 05/03/2018    Past Surgical History:  Procedure Laterality Date   CATARACT EXTRACTION, BILATERAL     HERNIA REPAIR     groin   LUNG TRANSPLANT, SINGLE Right 09/15/2018    There were no vitals filed for this visit.   Subjective Assessment - 04/02/21 1411     Subjective COVID-19 screen performed prior to patient entering clinic.  No new complaints.    How long can you walk comfortably? Walk around home.    Patient Stated Goals Walk for exercise.    Currently in Pain? Yes    Pain Score 4     Pain Location Knee    Pain Orientation Right    Pain Descriptors / Indicators Sore    Pain Type Surgical pain    Pain Onset More than a month ago                               [facility] Adult PT Treatment/Exercise - 04/02/21 0001       Exercises   Exercises Knee/Hip      Lumbar Exercises: Aerobic   Nustep Level 3 x 15 minutes moving seat forward x 2 to increase knee flexion.      Knee/Hip Exercises: Supine   Short Arc Quad Sets Limitations SAQ's x 16  minutes facilitated with Bi-Phasic electrical stimulation (10 sec extension holds and 10 sec rest).      Vasopneumatic   Number Minutes Vasopneumatic  15 minutes    Vasopnuematic Location  --   Right knee.   Vasopneumatic Pressure Low                         PT Long Term Goals - 03/26/21 1112       PT LONG TERM GOAL #1   Title Independent with a HEP.    Time 4    Period Weeks    Status New      PT LONG TERM GOAL #2   Title Full right active knee extension in order to normalize gait.    Time 4    Period Weeks    Status New      PT LONG TERM GOAL #3   Title Active knee flexion to 125 degrees+ so the patient can perform functional tasks and do so with  pain not > 2-3/10.    Time 4    Period Weeks    Status New      PT LONG TERM GOAL #4   Title Increase right hip and knee strength to a solid 4+/5 to provide good stability for accomplishment of functional activities.    Time 4    Period Weeks    Status New      PT LONG TERM GOAL #5   Title Perform a reciprocating stair gait with one railing with pain not > 2-3/10.    Time 4    Period Weeks    Status New                   Plan - 04/02/21 1425     Clinical Impression Statement The patient is doing very well.  He exhibited an excellent right quadriceps contraction facilitated with Bi-Phasic electrical stimulation.    Personal Factors and Comorbidities Comorbidity 1;Comorbidity 2;Other    Comorbidities Pulmonary fibrosis, hernia repair, Right lung transplant, OA, h/o left knee pain.    Stability/Clinical Decision Making Stable/Uncomplicated    Rehab Potential Excellent    PT Frequency 3x / week    PT Duration 4 weeks    PT Treatment/Interventions ADLs/Self Care Home Management;Cryotherapy;Electrical Stimulation;Moist Heat;Gait training;Stair training;Functional mobility training;Therapeutic activities;Therapeutic exercise;Neuromuscular re-education;Manual techniques;Patient/family education;Passive  range of motion;Vasopneumatic Device    PT Next Visit Plan Nustep with progression to recumbent bike, PRE's.  Vasopneumatic.    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[place]) 07/21/2019   Dry eye syndrome of both eyes 03/01/2019   CKD (chronic kidney disease) stage 3, GFR 30-59 ml/min (HCC) 10/22/2018   Immunosuppression ([place]) 10/11/2018   Status post lung transplantation ([place]) 09/16/2018   Bilateral posterior capsular opacification 07/16/2018   Interstitial lung disease ([place]) 06/25/2018   CAD (coronary artery disease) 09/21/2017   NSIP (nonspecific interstitial pneumonia) ([place]) 12/18/2016   Osteoarthritis of knee 03/13/2015   Heart murmur, systolic    Arthritis    Essential hypertension 01/07/2011   Hyperlipidemia 01/07/2011    Shamya Macfadden, [name] MPT 04/02/2021, 3:01 PM  [facility] [address] [place], [place], [address] Phone: [phone]   Fax:  [phone]  Name: Vernon Richard MRN: [id number] Date of Birth: 27-Dec-1950

## 2021-04-04 DIAGNOSIS — E785 Hyperlipidemia, unspecified: Secondary | ICD-10-CM | POA: Diagnosis not present

## 2021-04-04 DIAGNOSIS — Z942 Lung transplant status: Secondary | ICD-10-CM | POA: Diagnosis not present

## 2021-04-04 DIAGNOSIS — Z4824 Encounter for aftercare following lung transplant: Secondary | ICD-10-CM | POA: Diagnosis not present

## 2021-04-04 DIAGNOSIS — Z5181 Encounter for therapeutic drug level monitoring: Secondary | ICD-10-CM | POA: Diagnosis not present

## 2021-04-04 DIAGNOSIS — R251 Tremor, unspecified: Secondary | ICD-10-CM | POA: Diagnosis not present

## 2021-04-04 DIAGNOSIS — Z96651 Presence of right artificial knee joint: Secondary | ICD-10-CM | POA: Diagnosis not present

## 2021-04-04 DIAGNOSIS — N183 Chronic kidney disease, stage 3 unspecified: Secondary | ICD-10-CM | POA: Diagnosis not present

## 2021-04-04 DIAGNOSIS — I480 Paroxysmal atrial fibrillation: Secondary | ICD-10-CM | POA: Diagnosis not present

## 2021-04-04 DIAGNOSIS — M25569 Pain in unspecified knee: Secondary | ICD-10-CM | POA: Diagnosis not present

## 2021-04-04 DIAGNOSIS — I10 Essential (primary) hypertension: Secondary | ICD-10-CM | POA: Diagnosis not present

## 2021-04-04 DIAGNOSIS — Z79899 Other long term (current) drug therapy: Secondary | ICD-10-CM | POA: Diagnosis not present

## 2021-04-04 DIAGNOSIS — D84821 Immunodeficiency due to drugs: Secondary | ICD-10-CM | POA: Diagnosis not present

## 2021-04-04 DIAGNOSIS — N1831 Chronic kidney disease, stage 3a: Secondary | ICD-10-CM | POA: Diagnosis not present

## 2021-04-04 DIAGNOSIS — Z8616 Personal history of COVID-19: Secondary | ICD-10-CM | POA: Diagnosis not present

## 2021-04-04 DIAGNOSIS — Z7982 Long term (current) use of aspirin: Secondary | ICD-10-CM | POA: Diagnosis not present

## 2021-04-04 DIAGNOSIS — T86818 Other complications of lung transplant: Secondary | ICD-10-CM | POA: Diagnosis not present

## 2021-04-04 DIAGNOSIS — R059 Cough, unspecified: Secondary | ICD-10-CM | POA: Diagnosis not present

## 2021-04-04 DIAGNOSIS — I129 Hypertensive chronic kidney disease with stage 1 through stage 4 chronic kidney disease, or unspecified chronic kidney disease: Secondary | ICD-10-CM | POA: Diagnosis not present

## 2021-04-04 DIAGNOSIS — E1122 Type 2 diabetes mellitus with diabetic chronic kidney disease: Secondary | ICD-10-CM | POA: Diagnosis not present

## 2021-04-04 DIAGNOSIS — M1710 Unilateral primary osteoarthritis, unspecified knee: Secondary | ICD-10-CM | POA: Diagnosis not present

## 2021-04-05 ENCOUNTER — Other Ambulatory Visit: Payer: Self-pay

## 2021-04-05 ENCOUNTER — Ambulatory Visit: Payer: Medicare Other | Admitting: *Deleted

## 2021-04-05 DIAGNOSIS — R600 Localized edema: Secondary | ICD-10-CM

## 2021-04-05 DIAGNOSIS — G8929 Other chronic pain: Secondary | ICD-10-CM

## 2021-04-05 DIAGNOSIS — M25661 Stiffness of right knee, not elsewhere classified: Secondary | ICD-10-CM | POA: Diagnosis not present

## 2021-04-05 DIAGNOSIS — M25561 Pain in right knee: Secondary | ICD-10-CM | POA: Diagnosis not present

## 2021-04-05 NOTE — Therapy (Signed)
[facility] [place], [place], [address] Phone: [phone]   Fax:  [phone]  Physical Therapy Treatment  Patient Details  Name: Vernon Richard MRN: [id number] Date of Birth: Feb 25, 1951 Referring Provider (PT): Tacey Ruiz MD   Encounter Date: 04/05/2021   PT End of Session - 04/05/21 1113     Visit Number 4    Number of Visits 12    Date for PT Re-Evaluation 06/24/21    Authorization Type FOTO AT LEAST EVERY 5TH VISIT.  PROGRESS NOTE AT 10TH VISIT.  KX MODIFIER AFTER 15 VISITS.    PT Start Time 1115    PT Stop Time 1204    PT Time Calculation (min) 49 min             Past Medical History:  Diagnosis Date   Arthritis    OA of both knees   Heart murmur, systolic    Hyperlipidemia    Hypertension    Pulmonary fibrosis ([place]) 05/03/2018    Past Surgical History:  Procedure Laterality Date   CATARACT EXTRACTION, BILATERAL     HERNIA REPAIR     groin   LUNG TRANSPLANT, SINGLE Right 09/15/2018    There were no vitals filed for this visit.   Subjective Assessment - 04/05/21 1131     Subjective COVID-19 screen performed prior to patient entering clinic.  Very tired and wiped out from yesterday. Take it easy today    Pertinent History Pulmonary fibrosis, hernia repair, Right lung transplant, OA, h/o left knee pain.    How long can you walk comfortably? Walk around home.    Patient Stated Goals Walk for exercise.    Currently in Pain? Yes    Pain Score 4     Pain Location Knee    Pain Orientation Right    Pain Descriptors / Indicators Sore    Pain Type Surgical pain                               [facility] Adult PT Treatment/Exercise - 04/05/21 0001       Exercises   Exercises Knee/Hip      Lumbar Exercises: Aerobic   Nustep Level 3 x 15 minutes moving seat forward x 2 to increase knee flexion.9,8      Knee/Hip Exercises: Supine   Short Arc Quad Sets AROM   SAQs x10 mins with VMS 10  secs on/off RT VMO.     Modalities   Modalities Vasopneumatic      Vasopneumatic   Number Minutes Vasopneumatic  15 minutes    Vasopnuematic Location  --   Right knee.   Vasopneumatic Pressure Low    Vasopneumatic Temperature  pain/edema      Manual Therapy   Manual Therapy Passive ROM    Manual therapy comments PROM knee flexion to 115 degrees today                         PT Long Term Goals - 03/26/21 1112       PT LONG TERM GOAL #1   Title Independent with a HEP.    Time 4    Period Weeks    Status New      PT LONG TERM GOAL #2   Title Full right active knee extension in order to normalize gait.    Time 4    Period Weeks  Status New      PT LONG TERM GOAL #3   Title Active knee flexion to 125 degrees+ so the patient can perform functional tasks and do so with pain not > 2-3/10.    Time 4    Period Weeks    Status New      PT LONG TERM GOAL #4   Title Increase right hip and knee strength to a solid 4+/5 to provide good stability for accomplishment of functional activities.    Time 4    Period Weeks    Status New      PT LONG TERM GOAL #5   Title Perform a reciprocating stair gait with one railing with pain not > 2-3/10.    Time 4    Period Weeks    Status New                   Plan - 04/05/21 1113     Clinical Impression Statement Pt arrived today very tired and requested same as last Rx. Rx focused on ROM and quad activation RT LE. PROM for flexion today was 115 degrees. Normal Vaso response today.    Personal Factors and Comorbidities Comorbidity 1;Comorbidity 2;Other    Comorbidities Pulmonary fibrosis, hernia repair, Right lung transplant, OA, h/o left knee pain.    Examination-Activity Limitations Locomotion Level;Other    Stability/Clinical Decision Making Stable/Uncomplicated    Rehab Potential Excellent    PT Frequency 3x / week    PT Treatment/Interventions ADLs/Self Care Home Management;Cryotherapy;Electrical  Stimulation;Moist Heat;Gait training;Stair training;Functional mobility training;Therapeutic activities;Therapeutic exercise;Neuromuscular re-education;Manual techniques;Patient/family education;Passive range of motion;Vasopneumatic Device    PT Next Visit Plan Nustep with progression to recumbent bike, PRE's.  Vasopneumatic.             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[place]) 07/21/2019   Dry eye syndrome of both eyes 03/01/2019   CKD (chronic kidney disease) stage 3, GFR 30-59 ml/min (HCC) 10/22/2018   Immunosuppression ([place]) 10/11/2018   Status post lung transplantation ([place]) 09/16/2018   Bilateral posterior capsular opacification 07/16/2018   Interstitial lung disease ([place]) 06/25/2018   CAD (coronary artery disease) 09/21/2017   NSIP (nonspecific interstitial pneumonia) ([place]) 12/18/2016   Osteoarthritis of knee 03/13/2015   Heart murmur, systolic    Arthritis    Essential hypertension 01/07/2011   Hyperlipidemia 01/07/2011    Jemell Town,CHRIS, PTA 04/05/2021, 12:04 PM  [facility] [address] [place], [place], [address] Phone: [phone]   Fax:  [phone]  Name: Vernon Richard MRN: [id number] Date of Birth: [birth year]

## 2021-04-08 DIAGNOSIS — M5416 Radiculopathy, lumbar region: Secondary | ICD-10-CM | POA: Diagnosis not present

## 2021-04-09 ENCOUNTER — Other Ambulatory Visit: Payer: Self-pay

## 2021-04-09 ENCOUNTER — Encounter: Payer: Self-pay | Admitting: Physical Therapy

## 2021-04-09 ENCOUNTER — Ambulatory Visit: Payer: Medicare Other | Admitting: Physical Therapy

## 2021-04-09 DIAGNOSIS — M25661 Stiffness of right knee, not elsewhere classified: Secondary | ICD-10-CM

## 2021-04-09 DIAGNOSIS — M25561 Pain in right knee: Secondary | ICD-10-CM | POA: Diagnosis not present

## 2021-04-09 DIAGNOSIS — R600 Localized edema: Secondary | ICD-10-CM

## 2021-04-09 DIAGNOSIS — G8929 Other chronic pain: Secondary | ICD-10-CM

## 2021-04-09 NOTE — Therapy (Signed)
[facility] [place], [place], [address] Phone: [phone]   Fax:  [phone]  Physical Therapy Treatment  Patient Details  Name: Vernon Richard MRN: [id number] Date of Birth: [birth year] Referring Provider (PT): Tacey Ruiz MD   Encounter Date: 04/09/2021   PT End of Session - 04/09/21 1409     Visit Number 5    Number of Visits 12    Date for PT Re-Evaluation 06/24/21    Authorization Type FOTO AT LEAST EVERY 5TH VISIT.  PROGRESS NOTE AT 10TH VISIT.  KX MODIFIER AFTER 15 VISITS.    PT Start Time 0147    PT Stop Time 0239    PT Time Calculation (min) 52 min    Activity Tolerance Patient tolerated treatment well             Past Medical History:  Diagnosis Date   Arthritis    OA of both knees   Heart murmur, systolic    Hyperlipidemia    Hypertension    Pulmonary fibrosis ([place]) 05/03/2018    Past Surgical History:  Procedure Laterality Date   CATARACT EXTRACTION, BILATERAL     HERNIA REPAIR     groin   LUNG TRANSPLANT, SINGLE Right 09/15/2018    There were no vitals filed for this visit.   Subjective Assessment - 04/09/21 1416     Subjective COVID-19 screen performed prior to patient entering clinic.  Doing good but back flared-up.  Been having problems for about a year.    Pertinent History Pulmonary fibrosis, hernia repair, Right lung transplant, OA, h/o left knee pain.    How long can you walk comfortably? Walk around home.    Patient Stated Goals Walk for exercise.    Currently in Pain? Yes    Pain Score 3     Pain Location Knee    Pain Orientation Right    Pain Descriptors / Indicators Sore    Pain Type Surgical pain    Pain Onset More than a month ago                               [facility] Adult PT Treatment/Exercise - 04/09/21 0001       Exercises   Exercises Knee/Hip      Lumbar Exercises: Aerobic   Recumbent Bike 10 minutes progessing to seat 4.    Nustep Level 4  x 10 minutes.      Knee/Hip Exercises: Supine   Short Arc Quad Sets Limitations SAQ's x  with 3# facilitated with Bi-Phasic electrical stimulation x 10 minutes (10 sec extension holds and 10 sec rest)      Modalities   Modalities Vasopneumatic      Vasopneumatic   Number Minutes Vasopneumatic  15 minutes    Vasopnuematic Location  --   Right knee.   Vasopneumatic Pressure Low                         PT Long Term Goals - 03/26/21 1112       PT LONG TERM GOAL #1   Title Independent with a HEP.    Time 4    Period Weeks    Status New      PT LONG TERM GOAL #2   Title Full right active knee extension in order to normalize gait.    Time 4    Period Weeks  Status New      PT LONG TERM GOAL #3   Title Active knee flexion to 125 degrees+ so the patient can perform functional tasks and do so with pain not > 2-3/10.    Time 4    Period Weeks    Status New      PT LONG TERM GOAL #4   Title Increase right hip and knee strength to a solid 4+/5 to provide good stability for accomplishment of functional activities.    Time 4    Period Weeks    Status New      PT LONG TERM GOAL #5   Title Perform a reciprocating stair gait with one railing with pain not > 2-3/10.    Time 4    Period Weeks    Status New                   Plan - 04/09/21 1425     Clinical Impression Statement The patient did great today with progression to recumbent bike today.  Discharge Nustep at htis time.    Personal Factors and Comorbidities Comorbidity 1;Comorbidity 2;Other    Comorbidities Pulmonary fibrosis, hernia repair, Right lung transplant, OA, h/o left knee pain.    Examination-Activity Limitations Locomotion Level;Other    Examination-Participation Restrictions Other    Stability/Clinical Decision Making Stable/Uncomplicated    Rehab Potential Excellent    PT Frequency 3x / week    PT Duration 4 weeks    PT Treatment/Interventions ADLs/Self Care Home  Management;Cryotherapy;Electrical Stimulation;Moist Heat;Gait training;Stair training;Functional mobility training;Therapeutic activities;Therapeutic exercise;Neuromuscular re-education;Manual techniques;Patient/family education;Passive range of motion;Vasopneumatic Device    PT Next Visit Plan Nustep with progression to recumbent bike, PRE's.  Vasopneumatic.    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   Dry eye syndrome of both eyes 03/01/2019   CKD (chronic kidney disease) stage 3, GFR 30-59 ml/min (HCC) 10/22/2018   Immunosuppression ([place]) 10/11/2018   Status post lung transplantation ([place]) 09/16/2018   Bilateral posterior capsular opacification 07/16/2018   Interstitial lung disease ([place]) 06/25/2018   CAD (coronary artery disease) 09/21/2017   NSIP (nonspecific interstitial pneumonia) ([place]) 12/18/2016   Osteoarthritis of knee 03/13/2015   Heart murmur, systolic    Arthritis    Essential hypertension 01/07/2011   Hyperlipidemia 01/07/2011    Lajarvis Italiano, [name] MPT 04/09/2021, 2:39 PM  [facility] [address] [place], [place], [address] Phone: [phone]   Fax:  [phone]  Name: Vernon Richard MRN: [id number] Date of Birth: September 12, 1951

## 2021-04-11 ENCOUNTER — Other Ambulatory Visit: Payer: Self-pay

## 2021-04-11 ENCOUNTER — Encounter: Payer: Self-pay | Admitting: Physical Therapy

## 2021-04-11 ENCOUNTER — Ambulatory Visit: Payer: Medicare Other | Admitting: Physical Therapy

## 2021-04-11 DIAGNOSIS — R600 Localized edema: Secondary | ICD-10-CM

## 2021-04-11 DIAGNOSIS — M25561 Pain in right knee: Secondary | ICD-10-CM | POA: Diagnosis not present

## 2021-04-11 DIAGNOSIS — G8929 Other chronic pain: Secondary | ICD-10-CM | POA: Diagnosis not present

## 2021-04-11 DIAGNOSIS — M25661 Stiffness of right knee, not elsewhere classified: Secondary | ICD-10-CM

## 2021-04-11 NOTE — Therapy (Signed)
[facility] [place], [place], [address] Phone: [phone]   Fax:  [phone]  Physical Therapy Treatment  Patient Details  Name: Vernon Richard MRN: [id number] Date of Birth: [birth year] Referring Provider (PT): Tacey Ruiz MD   Encounter Date: 04/11/2021   PT End of Session - 04/11/21 1353     Visit Number 6    Number of Visits 12    Date for PT Re-Evaluation 06/24/21    Authorization Type FOTO AT LEAST EVERY 5TH VISIT.  PROGRESS NOTE AT 10TH VISIT.  KX MODIFIER AFTER 15 VISITS.    PT Start Time 1348    PT Stop Time 1425    PT Time Calculation (min) 37 min    Activity Tolerance Patient tolerated treatment well    Behavior During Therapy WFL for tasks assessed/performed             Past Medical History:  Diagnosis Date   Arthritis    OA of both knees   Heart murmur, systolic    Hyperlipidemia    Hypertension    Pulmonary fibrosis ([place]) 05/03/2018    Past Surgical History:  Procedure Laterality Date   CATARACT EXTRACTION, BILATERAL     HERNIA REPAIR     groin   LUNG TRANSPLANT, SINGLE Right 09/15/2018    There were no vitals filed for this visit.   Subjective Assessment - 04/11/21 1352     Subjective COVID-19 screen performed prior to patient entering clinic. Reports that his nonoperative knee is hurting some today and having minimal improvement of R SI jont pain.    Pertinent History Pulmonary fibrosis, hernia repair, Right lung transplant, OA, h/o left knee pain.    How long can you walk comfortably? Walk around home.    Patient Stated Goals Walk for exercise.    Currently in Pain? No/denies                [facility] PT Assessment - 04/11/21 0001       Assessment   Medical Diagnosis Right total knee replacement.    Referring Provider (PT) Tacey Ruiz MD    Onset Date/Surgical Date 02/20/21    Next MD Visit 05/14/2021      Precautions   Precaution Comments No ultrasound.      Restrictions    Weight Bearing Restrictions No      ROM / Strength   AROM / PROM / Strength AROM      AROM   Overall AROM  Within functional limits for tasks performed    AROM Assessment Site Knee    Right/Left Knee Right    Right Knee Extension 0    Right Knee Flexion 127                           [facility] Adult PT Treatment/Exercise - 04/11/21 0001       Lumbar Exercises: Aerobic   Recumbent Bike --      Lumbar Exercises: Standing   Forward Lunge 20 reps;3 seconds    Forward Lunge Limitations off 8" step      Knee/Hip Exercises: Aerobic   Recumbent Bike L3, seat 3 x17 min      Knee/Hip Exercises: Standing   Forward Lunges Right;2 sets;10 reps;2 seconds    Forward Lunges Limitations off 8" step    Terminal Knee Extension Strengthening;Right;15 reps;Theraband    Theraband Level (Terminal Knee Extension) Level 4 (Blue)    Lateral Step  Up Right;5 reps;Hand Hold: 2;Step Height: 4"    Lateral Step Up Limitations stopped due to hip pain    Step Down Right;2 sets;10 reps;Hand Hold: 2;Step Height: 4"      Modalities   Modalities Vasopneumatic      Vasopneumatic   Number Minutes Vasopneumatic  10 minutes    Vasopnuematic Location  Knee    Vasopneumatic Pressure Low    Vasopneumatic Temperature  pain/edema                         PT Long Term Goals - 04/11/21 1417       PT LONG TERM GOAL #1   Title Independent with a HEP.    Time 4    Period Weeks    Status On-going      PT LONG TERM GOAL #2   Title Full right active knee extension in order to normalize gait.    Time 4    Period Weeks    Status Achieved      PT LONG TERM GOAL #3   Title Active knee flexion to 125 degrees+ so the patient can perform functional tasks and do so with pain not > 2-3/10.    Time 4    Period Weeks    Status Achieved      PT LONG TERM GOAL #4   Title Increase right hip and knee strength to a solid 4+/5 to provide good stability for accomplishment of functional  activities.    Time 4    Period Weeks    Status On-going      PT LONG TERM GOAL #5   Title Perform a reciprocating stair gait with one railing with pain not > 2-3/10.    Time 4    Period Weeks    Status On-going                   Plan - 04/11/21 1416     Clinical Impression Statement Patient has progressed very well following R TKR and denies any functional limitations at home. Patient progressed with stationary bike only and quad control/strengthening exercises but somewhat limited by R SI joint pain. AROM of R knee measured as 0-127 deg. Patient reports he is able to ascend stairs reciprically now. Normal vasopneumatic response noted following removal of the modality with one pillow folded under foot to assist with edema in RLE especially ankle.    Personal Factors and Comorbidities Comorbidity 1;Comorbidity 2;Other    Comorbidities Pulmonary fibrosis, hernia repair, Right lung transplant, OA, h/o left knee pain.    Examination-Activity Limitations Locomotion Level;Other    Examination-Participation Restrictions Other    Stability/Clinical Decision Making Stable/Uncomplicated    Rehab Potential Excellent    PT Frequency 3x / week    PT Duration 4 weeks    PT Treatment/Interventions ADLs/Self Care Home Management;Cryotherapy;Electrical Stimulation;Moist Heat;Gait training;Stair training;Functional mobility training;Therapeutic activities;Therapeutic exercise;Neuromuscular re-education;Manual techniques;Patient/family education;Passive range of motion;Vasopneumatic Device    PT Next Visit Plan Progress strengthening but be aware of LBP/SIJ pain.    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   Dry eye syndrome of both eyes 03/01/2019   CKD (chronic kidney disease) stage 3, GFR 30-59 ml/min (HCC) 10/22/2018   Immunosuppression ([place]) 10/11/2018   Status post lung transplantation ([place]) 09/16/2018   Bilateral posterior capsular opacification 07/16/2018   Interstitial lung disease ([place]) 06/25/2018   CAD (coronary artery disease) 09/21/2017   NSIP (nonspecific interstitial pneumonia) ([place]) 12/18/2016   Osteoarthritis of knee 03/13/2015   Heart murmur, systolic    Arthritis    Essential hypertension 01/07/2011   Hyperlipidemia 01/07/2011    Standley Brooking, PTA 04/11/2021, 3:52 PM  [facility] [address] [place], [place], [address] Phone: [phone]   Fax:  [phone]  Name: SABRINA ARRIAGA MRN: [id number] Date of Birth: June 13, 1951

## 2021-04-15 ENCOUNTER — Other Ambulatory Visit: Payer: Self-pay

## 2021-04-15 ENCOUNTER — Encounter: Payer: Self-pay | Admitting: Physical Therapy

## 2021-04-15 ENCOUNTER — Ambulatory Visit: Payer: Medicare Other | Admitting: Physical Therapy

## 2021-04-15 DIAGNOSIS — R600 Localized edema: Secondary | ICD-10-CM

## 2021-04-15 DIAGNOSIS — M25661 Stiffness of right knee, not elsewhere classified: Secondary | ICD-10-CM | POA: Diagnosis not present

## 2021-04-15 DIAGNOSIS — G8929 Other chronic pain: Secondary | ICD-10-CM | POA: Diagnosis not present

## 2021-04-15 DIAGNOSIS — M25561 Pain in right knee: Secondary | ICD-10-CM | POA: Diagnosis not present

## 2021-04-15 NOTE — Therapy (Signed)
[facility] [place], [place], [address] Phone: [phone]   Fax:  [phone]  Physical Therapy Treatment  Patient Details  Name: Vernon Richard MRN: [id number] Date of Birth: 1951/06/11 Referring Provider (PT): Tacey Ruiz MD   Encounter Date: 04/15/2021   PT End of Session - 04/15/21 1340     Visit Number 7    Number of Visits 12    Date for PT Re-Evaluation 06/24/21    Authorization Type FOTO AT LEAST EVERY 5TH VISIT.  PROGRESS NOTE AT 10TH VISIT.  KX MODIFIER AFTER 15 VISITS.    PT Start Time 1345    PT Stop Time 1420    PT Time Calculation (min) 35 min    Activity Tolerance Patient tolerated treatment well    Behavior During Therapy WFL for tasks assessed/performed             Past Medical History:  Diagnosis Date   Arthritis    OA of both knees   Heart murmur, systolic    Hyperlipidemia    Hypertension    Pulmonary fibrosis ([place]) 05/03/2018    Past Surgical History:  Procedure Laterality Date   CATARACT EXTRACTION, BILATERAL     HERNIA REPAIR     groin   LUNG TRANSPLANT, SINGLE Right 09/15/2018    There were no vitals filed for this visit.   Subjective Assessment - 04/15/21 1339     Subjective COVID-19 screen performed prior to patient entering clinic. Reported soreness upon waking this morning but dissipated after using stationary bike. Patient denies any SI pain upon arrival but reports it as intermittant.    Pertinent History Pulmonary fibrosis, hernia repair, Right lung transplant, OA, h/o left knee pain.    How long can you walk comfortably? Walk around home.    Patient Stated Goals Walk for exercise.    Currently in Pain? No/denies                [facility] PT Assessment - 04/15/21 0001       Assessment   Medical Diagnosis Right total knee replacement.    Referring Provider (PT) Tacey Ruiz MD    Onset Date/Surgical Date 02/20/21    Next MD Visit 05/14/2021      Precautions    Precaution Comments No ultrasound.                           [facility] Adult PT Treatment/Exercise - 04/15/21 0001       Knee/Hip Exercises: Aerobic   Recumbent Bike L3, seat 4 x15 min      Knee/Hip Exercises: Machines for Strengthening   Cybex Knee Extension 10# 3x10 reps    Cybex Knee Flexion 30# 3x10 reps      Knee/Hip Exercises: Standing   Hip Flexion Stengthening;Right;2 sets;10 reps;Knee bent    Hip Flexion Limitations 3#    Hip Abduction Stengthening;Right;2 sets;10 reps;Knee straight;Limitations    Abduction Limitations 3#    Step Down Right;2 sets;10 reps;Hand Hold: 2;Step Height: 4"      Knee/Hip Exercises: Supine   Straight Leg Raises AROM;Right;10 reps      Modalities   Modalities Vasopneumatic      Vasopneumatic   Number Minutes Vasopneumatic  10 minutes    Vasopnuematic Location  Knee    Vasopneumatic Pressure Low    Vasopneumatic Temperature  34/sore  PT Long Term Goals - 04/11/21 1417       PT LONG TERM GOAL #1   Title Independent with a HEP.    Time 4    Period Weeks    Status On-going      PT LONG TERM GOAL #2   Title Full right active knee extension in order to normalize gait.    Time 4    Period Weeks    Status Achieved      PT LONG TERM GOAL #3   Title Active knee flexion to 125 degrees+ so the patient can perform functional tasks and do so with pain not > 2-3/10.    Time 4    Period Weeks    Status Achieved      PT LONG TERM GOAL #4   Title Increase right hip and knee strength to a solid 4+/5 to provide good stability for accomplishment of functional activities.    Time 4    Period Weeks    Status On-going      PT LONG TERM GOAL #5   Title Perform a reciprocating stair gait with one railing with pain not > 2-3/10.    Time 4    Period Weeks    Status On-going                   Plan - 04/15/21 1413     Clinical Impression Statement Patient presented in clinic with  reports of no current discomfort but of soreness intermittantly. Patient reported soreness initially upon waking this morning but got on stationary bike and reported relief. Patient denies any functional limitations but fatigues quickly which could be related to lung pathology history. Patient required greater cueing for increased TKE for SLR due to fatigue. Caution provided during therex session to avoid any exaggeration of SI pain which is intermittant per patient report. Normal vasopneumatic response noted following removal of the modality.    Personal Factors and Comorbidities Comorbidity 1;Comorbidity 2;Other    Comorbidities Pulmonary fibrosis, hernia repair, Right lung transplant, OA, h/o left knee pain.    Examination-Activity Limitations Locomotion Level;Other    Examination-Participation Restrictions Other    Stability/Clinical Decision Making Stable/Uncomplicated    Rehab Potential Excellent    PT Frequency 3x / week    PT Duration 4 weeks    PT Treatment/Interventions ADLs/Self Care Home Management;Cryotherapy;Electrical Stimulation;Moist Heat;Gait training;Stair training;Functional mobility training;Therapeutic activities;Therapeutic exercise;Neuromuscular re-education;Manual techniques;Patient/family education;Passive range of motion;Vasopneumatic Device    PT Next Visit Plan Progress strengthening but be aware of LBP/SIJ pain.    Consulted and Agree with Plan of Care Patient             Patient will benefit from skilled therapeutic intervention in order to improve the following deficits and impairments:  Decreased activity tolerance, Decreased range of motion, Decreased strength, Increased edema, Pain  Visit Diagnosis: Chronic pain of right knee  Stiffness of right knee, not elsewhere classified  Localized edema     Problem List Patient Active Problem List   Diagnosis Date Noted   Osteopenia of multiple sites 07/06/2020   Antibody mediated rejection of lung  transplant ([place]) 07/21/2019   Dry eye syndrome of both eyes 03/01/2019   CKD (chronic kidney disease) stage 3, GFR 30-59 ml/min (HCC) 10/22/2018   Immunosuppression ([place]) 10/11/2018   Status post lung transplantation ([place]) 09/16/2018   Bilateral posterior capsular opacification 07/16/2018   Interstitial lung disease ([place]) 06/25/2018   CAD (coronary artery disease) 09/21/2017   NSIP (nonspecific  interstitial pneumonia) ([place]) 12/18/2016   Osteoarthritis of knee 03/13/2015   Heart murmur, systolic    Arthritis    Essential hypertension 01/07/2011   Hyperlipidemia 01/07/2011    Standley Brooking, PTA 04/15/2021, 2:22 PM  [facility] [address] [place], [place], [address] Phone: [phone]   Fax:  [phone]  Name: KASEM MOZER MRN: [id number] Date of Birth: 1950-12-05

## 2021-04-18 ENCOUNTER — Other Ambulatory Visit: Payer: Self-pay

## 2021-04-18 ENCOUNTER — Ambulatory Visit: Payer: Medicare Other | Admitting: *Deleted

## 2021-04-18 DIAGNOSIS — G8929 Other chronic pain: Secondary | ICD-10-CM

## 2021-04-18 DIAGNOSIS — M25661 Stiffness of right knee, not elsewhere classified: Secondary | ICD-10-CM

## 2021-04-18 DIAGNOSIS — M25561 Pain in right knee: Secondary | ICD-10-CM | POA: Diagnosis not present

## 2021-04-18 DIAGNOSIS — R600 Localized edema: Secondary | ICD-10-CM

## 2021-04-18 NOTE — Therapy (Signed)
[facility] [place], [place], [address] Phone: [phone]   Fax:  [phone]  Physical Therapy Treatment  Patient Details  Name: Vernon Richard MRN: [id number] Date of Birth: 1951/01/04 Referring Provider (PT): Tacey Ruiz MD   Encounter Date: 04/18/2021   PT End of Session - 04/18/21 1445     Visit Number 8    Number of Visits 12    Date for PT Re-Evaluation 06/24/21    Authorization Type FOTO AT LEAST EVERY 5TH VISIT.  PROGRESS NOTE AT 10TH VISIT.  KX MODIFIER AFTER 15 VISITS.    PT Start Time 3762    PT Stop Time 1435    PT Time Calculation (min) 50 min             Past Medical History:  Diagnosis Date   Arthritis    OA of both knees   Heart murmur, systolic    Hyperlipidemia    Hypertension    Pulmonary fibrosis ([place]) 05/03/2018    Past Surgical History:  Procedure Laterality Date   CATARACT EXTRACTION, BILATERAL     HERNIA REPAIR     groin   LUNG TRANSPLANT, SINGLE Right 09/15/2018    There were no vitals filed for this visit.   Subjective Assessment - 04/18/21 1405     Subjective COVID-19 screen performed prior to patient entering clinic. Reported soreness upon waking this morning but dissipated after using stationary bike. LBP is the worst    Pertinent History Pulmonary fibrosis, hernia repair, Right lung transplant, OA, h/o left knee pain.    How long can you walk comfortably? Walk around home.    Patient Stated Goals Walk for exercise.    Currently in Pain? Yes    Pain Score 2     Pain Location Knee                               [facility] Adult PT Treatment/Exercise - 04/18/21 0001       Knee/Hip Exercises: Aerobic   Recumbent Bike L3, seat 4 x15 min      Knee/Hip Exercises: Machines for Strengthening   Cybex Knee Extension 10# 3x10 reps    Cybex Knee Flexion 30# 3x10 reps      Knee/Hip Exercises: Standing   Rocker Board 3 minutes      Modalities   Modalities  Vasopneumatic      Vasopneumatic   Number Minutes Vasopneumatic  15 minutes    Vasopnuematic Location  Knee    Vasopneumatic Pressure Low    Vasopneumatic Temperature  34/sore                         PT Long Term Goals - 04/11/21 1417       PT LONG TERM GOAL #1   Title Independent with a HEP.    Time 4    Period Weeks    Status On-going      PT LONG TERM GOAL #2   Title Full right active knee extension in order to normalize gait.    Time 4    Period Weeks    Status Achieved      PT LONG TERM GOAL #3   Title Active knee flexion to 125 degrees+ so the patient can perform functional tasks and do so with pain not > 2-3/10.    Time 4    Period Weeks  Status Achieved      PT LONG TERM GOAL #4   Title Increase right hip and knee strength to a solid 4+/5 to provide good stability for accomplishment of functional activities.    Time 4    Period Weeks    Status On-going      PT LONG TERM GOAL #5   Title Perform a reciprocating stair gait with one railing with pain not > 2-3/10.    Time 4    Period Weeks    Status On-going                   Plan - 04/18/21 1446     Clinical Impression Statement Pt arrived today with minimum knee pain , but his LB was flared up. He was able to perform the bike and some strengthening exs, but not as many due to LBP. Normal Vaso response today    Personal Factors and Comorbidities Comorbidity 1;Comorbidity 2;Other    Comorbidities Pulmonary fibrosis, hernia repair, Right lung transplant, OA, h/o left knee pain.    Stability/Clinical Decision Making Stable/Uncomplicated    Rehab Potential Excellent    PT Frequency 3x / week    PT Duration 4 weeks    PT Treatment/Interventions ADLs/Self Care Home Management;Cryotherapy;Electrical Stimulation;Moist Heat;Gait training;Stair training;Functional mobility training;Therapeutic activities;Therapeutic exercise;Neuromuscular re-education;Manual techniques;Patient/family  education;Passive range of motion;Vasopneumatic Device    PT Next Visit Plan Progress strengthening but be aware of LBP/SIJ pain.    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   Dry eye syndrome of both eyes 03/01/2019   CKD (chronic kidney disease) stage 3, GFR 30-59 ml/min (HCC) 10/22/2018   Immunosuppression ([place]) 10/11/2018   Status post lung transplantation ([place]) 09/16/2018   Bilateral posterior capsular opacification 07/16/2018   Interstitial lung disease ([place]) 06/25/2018   CAD (coronary artery disease) 09/21/2017   NSIP (nonspecific interstitial pneumonia) ([place]) 12/18/2016   Osteoarthritis of knee 03/13/2015   Heart murmur, systolic    Arthritis    Essential hypertension 01/07/2011   Hyperlipidemia 01/07/2011    Syd Manges,CHRIS, PTA 04/18/2021, 2:56 PM  [facility] [address] [place], [place], [address] Phone: [phone]   Fax:  [phone]  Name: Vernon Richard MRN: [id number] Date of Birth: 31-May-1951

## 2021-04-19 DIAGNOSIS — H02886 Meibomian gland dysfunction of left eye, unspecified eyelid: Secondary | ICD-10-CM | POA: Diagnosis not present

## 2021-04-19 DIAGNOSIS — H04123 Dry eye syndrome of bilateral lacrimal glands: Secondary | ICD-10-CM | POA: Diagnosis not present

## 2021-04-19 DIAGNOSIS — L718 Other rosacea: Secondary | ICD-10-CM | POA: Diagnosis not present

## 2021-04-19 DIAGNOSIS — H02883 Meibomian gland dysfunction of right eye, unspecified eyelid: Secondary | ICD-10-CM | POA: Diagnosis not present

## 2021-04-23 ENCOUNTER — Ambulatory Visit: Payer: Medicare Other | Attending: Orthopedic Surgery | Admitting: *Deleted

## 2021-04-23 ENCOUNTER — Other Ambulatory Visit: Payer: Self-pay

## 2021-04-23 DIAGNOSIS — G8929 Other chronic pain: Secondary | ICD-10-CM | POA: Diagnosis not present

## 2021-04-23 DIAGNOSIS — M25561 Pain in right knee: Secondary | ICD-10-CM | POA: Diagnosis not present

## 2021-04-23 DIAGNOSIS — M5441 Lumbago with sciatica, right side: Secondary | ICD-10-CM | POA: Diagnosis not present

## 2021-04-23 DIAGNOSIS — M25661 Stiffness of right knee, not elsewhere classified: Secondary | ICD-10-CM | POA: Diagnosis not present

## 2021-04-23 DIAGNOSIS — R600 Localized edema: Secondary | ICD-10-CM

## 2021-04-23 NOTE — Therapy (Signed)
[facility] [place], [place], [address] Phone: [phone]   Fax:  [phone]  Physical Therapy Treatment  Patient Details  Name: Vernon Richard MRN: [id number] Date of Birth: 11-20-1950 Referring Provider (PT): Tacey Ruiz MD   Encounter Date: 04/23/2021   PT End of Session - 04/23/21 1817     Visit Number 9    Number of Visits 12    Date for PT Re-Evaluation 06/24/21    Authorization Type FOTO AT LEAST EVERY 5TH VISIT.  PROGRESS NOTE AT 10TH VISIT.  KX MODIFIER AFTER 15 VISITS.    PT Start Time 1430    PT Stop Time 1521    PT Time Calculation (min) 51 min             Past Medical History:  Diagnosis Date   Arthritis    OA of both knees   Heart murmur, systolic    Hyperlipidemia    Hypertension    Pulmonary fibrosis ([place]) 05/03/2018    Past Surgical History:  Procedure Laterality Date   CATARACT EXTRACTION, BILATERAL     HERNIA REPAIR     groin   LUNG TRANSPLANT, SINGLE Right 09/15/2018    There were no vitals filed for this visit.   Subjective Assessment - 04/23/21 1428     Subjective COVID-19 screen performed prior to patient entering clinic.    Pertinent History Pulmonary fibrosis, hernia repair, Right lung transplant, OA, h/o left knee pain.    How long can you walk comfortably? Walk around home.    Patient Stated Goals Walk for exercise.    Currently in Pain? Yes    Pain Score 2     Pain Location Knee    Pain Orientation Right    Pain Descriptors / Indicators Sore    Pain Type Surgical pain                               [facility] Adult PT Treatment/Exercise - 04/23/21 0001       Knee/Hip Exercises: Aerobic   Recumbent Bike L3, seat 4 x15 min      Knee/Hip Exercises: Machines for Strengthening   Cybex Knee Extension 10# 3x10-15 reps    Cybex Knee Flexion 30# 3x10-15 reps      Knee/Hip Exercises: Standing   Heel Raises Both;2 sets;10 reps    Heel Raises Limitations  toe raises 2x10      Modalities   Modalities Vasopneumatic      Vasopneumatic   Number Minutes Vasopneumatic  15 minutes    Vasopnuematic Location  Knee    Vasopneumatic Pressure Low    Vasopneumatic Temperature  34/sore                         PT Long Term Goals - 04/11/21 1417       PT LONG TERM GOAL #1   Title Independent with a HEP.    Time 4    Period Weeks    Status On-going      PT LONG TERM GOAL #2   Title Full right active knee extension in order to normalize gait.    Time 4    Period Weeks    Status Achieved      PT LONG TERM GOAL #3   Title Active knee flexion to 125 degrees+ so the patient can perform functional tasks and do so with pain  not > 2-3/10.    Time 4    Period Weeks    Status Achieved      PT LONG TERM GOAL #4   Title Increase right hip and knee strength to a solid 4+/5 to provide good stability for accomplishment of functional activities.    Time 4    Period Weeks    Status On-going      PT LONG TERM GOAL #5   Title Perform a reciprocating stair gait with one railing with pain not > 2-3/10.    Time 4    Period Weeks    Status On-going                   Plan - 04/23/21 1820     Clinical Impression Statement Pt arrived today doing fairly well with RT knee , but LBP continues to bother him during standing exs and has been limited. Normal Vaso response today. RT LE mainly fatigued end of Rx. Pt's ROM continues to be great    Personal Factors and Comorbidities Comorbidity 1;Comorbidity 2;Other    Comorbidities Pulmonary fibrosis, hernia repair, Right lung transplant, OA, h/o left knee pain.    Stability/Clinical Decision Making Stable/Uncomplicated    PT Frequency 3x / week    PT Duration 4 weeks    PT Treatment/Interventions ADLs/Self Care Home Management;Cryotherapy;Electrical Stimulation;Moist Heat;Gait training;Stair training;Functional mobility training;Therapeutic activities;Therapeutic exercise;Neuromuscular  re-education;Manual techniques;Patient/family education;Passive range of motion;Vasopneumatic Device    PT Next Visit Plan Progress strengthening but be aware of LBP/SIJ pain.    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   Dry eye syndrome of both eyes 03/01/2019   CKD (chronic kidney disease) stage 3, GFR 30-59 ml/min (HCC) 10/22/2018   Immunosuppression ([place]) 10/11/2018   Status post lung transplantation ([place]) 09/16/2018   Bilateral posterior capsular opacification 07/16/2018   Interstitial lung disease ([place]) 06/25/2018   CAD (coronary artery disease) 09/21/2017   NSIP (nonspecific interstitial pneumonia) ([place]) 12/18/2016   Osteoarthritis of knee 03/13/2015   Heart murmur, systolic    Arthritis    Essential hypertension 01/07/2011   Hyperlipidemia 01/07/2011    Jozee Hammer,CHRIS, PTA 04/23/2021, 6:27 PM  [facility] [address] [place], [place], [address] Phone: [phone]   Fax:  [phone]  Name: TAYSHAUN KROH MRN: [id number] Date of Birth: 1950-11-20

## 2021-04-25 ENCOUNTER — Ambulatory Visit: Payer: Medicare Other | Admitting: Physical Therapy

## 2021-04-25 ENCOUNTER — Other Ambulatory Visit: Payer: Self-pay

## 2021-04-25 ENCOUNTER — Encounter: Payer: Self-pay | Admitting: Physical Therapy

## 2021-04-25 DIAGNOSIS — T86819 Unspecified complication of lung transplant: Secondary | ICD-10-CM | POA: Diagnosis not present

## 2021-04-25 DIAGNOSIS — R600 Localized edema: Secondary | ICD-10-CM | POA: Diagnosis not present

## 2021-04-25 DIAGNOSIS — G8929 Other chronic pain: Secondary | ICD-10-CM

## 2021-04-25 DIAGNOSIS — Z79899 Other long term (current) drug therapy: Secondary | ICD-10-CM | POA: Diagnosis not present

## 2021-04-25 DIAGNOSIS — M25561 Pain in right knee: Secondary | ICD-10-CM

## 2021-04-25 DIAGNOSIS — M25661 Stiffness of right knee, not elsewhere classified: Secondary | ICD-10-CM | POA: Diagnosis not present

## 2021-04-25 DIAGNOSIS — Z942 Lung transplant status: Secondary | ICD-10-CM | POA: Diagnosis not present

## 2021-04-25 DIAGNOSIS — M5441 Lumbago with sciatica, right side: Secondary | ICD-10-CM | POA: Diagnosis not present

## 2021-04-25 DIAGNOSIS — Z298 Encounter for other specified prophylactic measures: Secondary | ICD-10-CM | POA: Diagnosis not present

## 2021-04-25 NOTE — Therapy (Signed)
[facility] [place], [place], [address] Phone: [phone]   Fax:  [phone]  Physical Therapy Treatment  Patient Details  Name: Vernon Richard MRN: [id number] Date of Birth: January 10, 1951 Referring Provider (PT): Tacey Ruiz MD   Encounter Date: 04/25/2021   PT End of Session - 04/25/21 1506     Visit Number 10    Number of Visits 12    Date for PT Re-Evaluation 06/24/21    Authorization Type FOTO AT LEAST EVERY 5TH VISIT.  PROGRESS NOTE AT 10TH VISIT.  KX MODIFIER AFTER 15 VISITS.    PT Start Time 0231    PT Stop Time 0315    PT Time Calculation (min) 44 min    Activity Tolerance Patient tolerated treatment well    Behavior During Therapy WFL for tasks assessed/performed             Past Medical History:  Diagnosis Date   Arthritis    OA of both knees   Heart murmur, systolic    Hyperlipidemia    Hypertension    Pulmonary fibrosis ([place]) 05/03/2018    Past Surgical History:  Procedure Laterality Date   CATARACT EXTRACTION, BILATERAL     HERNIA REPAIR     groin   LUNG TRANSPLANT, SINGLE Right 09/15/2018    There were no vitals filed for this visit.   Subjective Assessment - 04/25/21 1452     Subjective COVID-19 screen performed prior to patient entering clinic. Knee is doing good, back hurts.    Pertinent History Pulmonary fibrosis, hernia repair, Right lung transplant, OA, h/o left knee pain.    How long can you walk comfortably? Walk around home.    Patient Stated Goals Walk for exercise.    Currently in Pain? Yes    Pain Score 2     Pain Location Knee    Pain Orientation Right    Pain Descriptors / Indicators Sore    Pain Type Surgical pain    Pain Onset More than a month ago                               [facility] Adult PT Treatment/Exercise - 04/25/21 0001       Exercises   Exercises Knee/Hip      Lumbar Exercises: Aerobic   Recumbent Bike Level 3 x 15 minutes.       Knee/Hip Exercises: Machines for Strengthening   Cybex Knee Extension 10# x 3 minutes.    Cybex Knee Flexion 30# x 3 minutes    Total Gym Leg Press 3 plates x 2 minutes.      Vasopneumatic   Number Minutes Vasopneumatic  15 minutes    Vasopnuematic Location  --   Right knee.   Vasopneumatic Pressure Medium                         PT Long Term Goals - 04/25/21 1508       PT LONG TERM GOAL #1   Title Independent with a HEP.    Time 4    Period Weeks    Status On-going      PT LONG TERM GOAL #2   Title Full right active knee extension in order to normalize gait.    Time 4    Period Weeks    Status Achieved      PT LONG TERM GOAL #3  Title Active knee flexion to 125 degrees+ so the patient can perform functional tasks and do so with pain not > 2-3/10.    Time 4    Period Weeks    Status Achieved      PT LONG TERM GOAL #4   Title Increase right hip and knee strength to a solid 4+/5 to provide good stability for accomplishment of functional activities.    Time 4    Period Weeks    Status On-going      PT LONG TERM GOAL #5   Title Perform a reciprocating stair gait with one railing with pain not > 2-3/10.    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[place]) 07/21/2019   Dry eye syndrome of both eyes 03/01/2019   CKD (chronic kidney disease) stage 3, GFR 30-59 ml/min (HCC) 10/22/2018   Immunosuppression ([place]) 10/11/2018   Status post lung transplantation ([place]) 09/16/2018   Bilateral posterior capsular opacification 07/16/2018    Interstitial lung disease ([place]) 06/25/2018   CAD (coronary artery disease) 09/21/2017   NSIP (nonspecific interstitial pneumonia) ([place]) 12/18/2016   Osteoarthritis of knee 03/13/2015   Heart murmur, systolic    Arthritis    Essential hypertension 01/07/2011   Hyperlipidemia 01/07/2011   Progress Note Reporting Period 03/26/21 to 7/722  See note below for Objective Data and Assessment of Progress/Goals. Excellent progress with LTG's #2 and #3 met at this time.    Iman Reinertsen, [name] MPT 04/25/2021, 3:20 PM  [facility] [address] [place], [place], [address] Phone: [phone]   Fax:  [phone]  Name: Vernon Richard MRN: [id number] Date of Birth: 1951-05-31

## 2021-04-29 ENCOUNTER — Encounter: Payer: Self-pay | Admitting: Physical Therapy

## 2021-04-29 ENCOUNTER — Ambulatory Visit: Payer: Medicare Other | Admitting: Physical Therapy

## 2021-04-29 ENCOUNTER — Other Ambulatory Visit: Payer: Self-pay

## 2021-04-29 DIAGNOSIS — R600 Localized edema: Secondary | ICD-10-CM | POA: Diagnosis not present

## 2021-04-29 DIAGNOSIS — M25561 Pain in right knee: Secondary | ICD-10-CM

## 2021-04-29 DIAGNOSIS — G8929 Other chronic pain: Secondary | ICD-10-CM

## 2021-04-29 DIAGNOSIS — M25661 Stiffness of right knee, not elsewhere classified: Secondary | ICD-10-CM | POA: Diagnosis not present

## 2021-04-29 DIAGNOSIS — M5441 Lumbago with sciatica, right side: Secondary | ICD-10-CM | POA: Diagnosis not present

## 2021-04-29 NOTE — Therapy (Signed)
[facility] [place], [place], [address] Phone: [phone]   Fax:  [phone]  Physical Therapy Treatment  Patient Details  Name: Vernon Richard MRN: [id number] Date of Birth: 1951-09-18 Referring Provider (PT): Tacey Ruiz MD   Encounter Date: 04/29/2021   PT End of Session - 04/29/21 1432     Visit Number 11    Number of Visits 12    Date for PT Re-Evaluation 06/24/21    Authorization Type FOTO AT LEAST EVERY 5TH VISIT.  PROGRESS NOTE AT 10TH VISIT.  KX MODIFIER AFTER 15 VISITS.    PT Start Time 1430    PT Stop Time 1510    PT Time Calculation (min) 40 min    Activity Tolerance Patient tolerated treatment well    Behavior During Therapy WFL for tasks assessed/performed             Past Medical History:  Diagnosis Date   Arthritis    OA of both knees   Heart murmur, systolic    Hyperlipidemia    Hypertension    Pulmonary fibrosis ([place]) 05/03/2018    Past Surgical History:  Procedure Laterality Date   CATARACT EXTRACTION, BILATERAL     HERNIA REPAIR     groin   LUNG TRANSPLANT, SINGLE Right 09/15/2018    There were no vitals filed for this visit.   Subjective Assessment - 04/29/21 1432     Subjective COVID-19 screen performed prior to patient entering clinic. Knee is doing good, back hurts.    Pertinent History Pulmonary fibrosis, hernia repair, Right lung transplant, OA, h/o left knee pain.    How long can you walk comfortably? Walk around home.    Patient Stated Goals Walk for exercise.    Currently in Pain? Other (Comment)   "Hurt all over"               [facility] PT Assessment - 04/29/21 0001       Assessment   Medical Diagnosis Right total knee replacement.    Referring Provider (PT) Tacey Ruiz MD    Onset Date/Surgical Date 02/20/21    Next MD Visit 05/14/2021      Precautions   Precaution Comments No ultrasound.      Observation/Other Assessments   Focus on Therapeutic Outcomes  (FOTO)  13% limitation 11th visit 04/29/2021                           [facility] Adult PT Treatment/Exercise - 04/29/21 0001       Ambulation/Gait   Stairs Yes    Stairs Assistance 6: Modified independent (Device/Increase time)    Stair Management Technique One rail Left;Alternating pattern;Forwards    Number of Stairs 4   x1 RT   Height of Stairs 6.5      Knee/Hip Exercises: Aerobic   Recumbent Bike L4, sesat 5 x10 min      Knee/Hip Exercises: Machines for Strengthening   Cybex Knee Extension 10# x 3 minutes.    Cybex Knee Flexion 30# 3x10 reps    Total Gym Leg Press 3 plates x 2 minutes.      Knee/Hip Exercises: Seated   Sit to Sand 15 reps;20 reps;without UE support   x15 reps on airex; x20 reps for RLE SL     Vasopneumatic   Number Minutes Vasopneumatic  10 minutes    Vasopnuematic Location  Knee    Vasopneumatic Pressure Medium  Vasopneumatic Temperature  34/min edema                         PT Long Term Goals - 04/29/21 1503       PT LONG TERM GOAL #1   Title Independent with a HEP.    Time 4    Period Weeks    Status On-going      PT LONG TERM GOAL #2   Title Full right active knee extension in order to normalize gait.    Time 4    Period Weeks    Status Achieved      PT LONG TERM GOAL #3   Title Active knee flexion to 125 degrees+ so the patient can perform functional tasks and do so with pain not > 2-3/10.    Time 4    Period Weeks    Status Achieved      PT LONG TERM GOAL #4   Title Increase right hip and knee strength to a solid 4+/5 to provide good stability for accomplishment of functional activities.    Time 4    Period Weeks    Status On-going      PT LONG TERM GOAL #5   Title Perform a reciprocating stair gait with one railing with pain not > 2-3/10.    Time 4    Period Weeks    Status Achieved                   Plan - 04/29/21 1513     Clinical Impression Statement Patient presented in clinic  with reports of more LBP than R knee pain. Patient does still have minimal edema of the R knee at this time but has no hinderance from R knee with ADLs. Patient able to tolerate all therex for strengthening well although generally fatigued. Patient functional on stairs but observed with minimal difficulty descending compared to ascending. Patient eager to start lumbar PT treatment and consents to knee discharge on Wednesday. Normal vasopneumatic response noted following removal of the modality.    Personal Factors and Comorbidities Comorbidity 1;Comorbidity 2;Other    Comorbidities Pulmonary fibrosis, hernia repair, Right lung transplant, OA, h/o left knee pain.    Examination-Activity Limitations Locomotion Level;Other    Examination-Participation Restrictions Other    Stability/Clinical Decision Making Stable/Uncomplicated    Rehab Potential Excellent    PT Frequency 3x / week    PT Duration 4 weeks    PT Treatment/Interventions ADLs/Self Care Home Management;Cryotherapy;Electrical Stimulation;Moist Heat;Gait training;Stair training;Functional mobility training;Therapeutic activities;Therapeutic exercise;Neuromuscular re-education;Manual techniques;Patient/family education;Passive range of motion;Vasopneumatic Device    PT Next Visit Plan to be discharged 05/01/2021    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   Dry eye syndrome of both eyes 03/01/2019   CKD  (chronic kidney disease) stage 3, GFR 30-59 ml/min (HCC) 10/22/2018   Immunosuppression ([place]) 10/11/2018   Status post lung transplantation ([place]) 09/16/2018  Bilateral posterior capsular opacification 07/16/2018   Interstitial lung disease ([place]) 06/25/2018   CAD (coronary artery disease) 09/21/2017   NSIP (nonspecific interstitial pneumonia) ([place]) 12/18/2016   Osteoarthritis of knee 03/13/2015   Heart murmur, systolic    Arthritis    Essential hypertension 01/07/2011   Hyperlipidemia 01/07/2011    Standley Brooking, PTA 04/29/2021, 3:28 PM  [facility] [address] [place], [place], [address] Phone: [phone]   Fax:  [phone]  Name: NAKOA GANUS MRN: [id number] Date of Birth: [birth year]

## 2021-05-01 ENCOUNTER — Other Ambulatory Visit: Payer: Self-pay

## 2021-05-01 ENCOUNTER — Encounter: Payer: Self-pay | Admitting: Physical Therapy

## 2021-05-01 ENCOUNTER — Ambulatory Visit: Payer: Medicare Other | Admitting: Physical Therapy

## 2021-05-01 DIAGNOSIS — M25561 Pain in right knee: Secondary | ICD-10-CM

## 2021-05-01 DIAGNOSIS — G8929 Other chronic pain: Secondary | ICD-10-CM | POA: Diagnosis not present

## 2021-05-01 DIAGNOSIS — M5441 Lumbago with sciatica, right side: Secondary | ICD-10-CM | POA: Diagnosis not present

## 2021-05-01 DIAGNOSIS — R600 Localized edema: Secondary | ICD-10-CM | POA: Diagnosis not present

## 2021-05-01 DIAGNOSIS — M25661 Stiffness of right knee, not elsewhere classified: Secondary | ICD-10-CM | POA: Diagnosis not present

## 2021-05-01 NOTE — Therapy (Signed)
[facility] [place], [place], [address] Phone: [phone]   Fax:  [phone]  Physical Therapy Treatment  Patient Details  Name: Vernon Richard MRN: [id number] Date of Birth: 1950/12/13 Referring Provider (PT): Tacey Ruiz MD   Encounter Date: 05/01/2021   PT End of Session - 05/01/21 1350     Visit Number 12    Number of Visits 12    Date for PT Re-Evaluation 06/24/21    Authorization Type FOTO AT LEAST EVERY 5TH VISIT.  PROGRESS NOTE AT 10TH VISIT.  KX MODIFIER AFTER 15 VISITS.    PT Start Time 1346    PT Stop Time 1422   No modalities   PT Time Calculation (min) 36 min    Activity Tolerance Patient tolerated treatment well    Behavior During Therapy WFL for tasks assessed/performed             Past Medical History:  Diagnosis Date   Arthritis    OA of both knees   Heart murmur, systolic    Hyperlipidemia    Hypertension    Pulmonary fibrosis ([place]) 05/03/2018    Past Surgical History:  Procedure Laterality Date   CATARACT EXTRACTION, BILATERAL     HERNIA REPAIR     groin   LUNG TRANSPLANT, SINGLE Right 09/15/2018    There were no vitals filed for this visit.   Subjective Assessment - 05/01/21 1349     Subjective COVID-19 screen performed prior to patient entering clinic. Reports knee is doing good but has LBP still.    Pertinent History Pulmonary fibrosis, hernia repair, Right lung transplant, OA, h/o left knee pain.    How long can you walk comfortably? Walk around home.    Patient Stated Goals Walk for exercise.    Currently in Pain? No/denies                [facility] PT Assessment - 05/01/21 0001       Assessment   Medical Diagnosis Right total knee replacement.    Referring Provider (PT) Tacey Ruiz MD    Onset Date/Surgical Date 02/20/21    Next MD Visit 05/14/2021      Precautions   Precaution Comments No ultrasound.      ROM / Strength   AROM / PROM / Strength Strength       Strength   Overall Strength Within functional limits for tasks performed    Strength Assessment Site Hip;Knee    Right/Left Hip Right    Right Hip Flexion 4+/5    Right/Left Knee Right    Right Knee Flexion 4+/5    Right Knee Extension 4+/5                           [facility] Adult PT Treatment/Exercise - 05/01/21 0001       Knee/Hip Exercises: Stretches   Active Hamstring Stretch Right;5 reps;10 seconds    Other Knee/Hip Stretches RLE SKTC 5x30 sec    Other Knee/Hip Stretches R figure 4 stretch in supine 5x30 sec; LTR 5x5 sec      Knee/Hip Exercises: Aerobic   Recumbent Bike L4, seat 5 x15 min      Knee/Hip Exercises: Machines for Strengthening   Cybex Knee Extension 10# 3x10 reps    Cybex Knee Flexion 30# 3x10 reps    Total Gym Leg Press 2 plates, seat 6 3x10 reps  PT Long Term Goals - 05/01/21 1408       PT LONG TERM GOAL #1   Title Independent with a HEP.    Time 4    Period Weeks    Status Unable to assess      PT LONG TERM GOAL #2   Title Full right active knee extension in order to normalize gait.    Time 4    Period Weeks    Status Achieved      PT LONG TERM GOAL #3   Title Active knee flexion to 125 degrees+ so the patient can perform functional tasks and do so with pain not > 2-3/10.    Time 4    Period Weeks    Status Achieved      PT LONG TERM GOAL #4   Title Increase right hip and knee strength to a solid 4+/5 to provide good stability for accomplishment of functional activities.    Time 4    Period Weeks    Status Achieved      PT LONG TERM GOAL #5   Title Perform a reciprocating stair gait with one railing with pain not > 2-3/10.    Time 4    Period Weeks    Status Achieved                   Plan - 05/01/21 1442     Clinical Impression Statement Patient has excelled in PT following R TKR. Patient denies any functional limitations other than pain down RLE but unknown if related  to his hip or LBP. Patient able to climb stairs WNL. Patient guided through stretching to assist with RLE pain. Patient ambulating with antalgic gait and SPC outside his home enviroment but relates that radicular symptoms to his antalgic gait. No modalities per no excessive knee edema or discomfort of knee joint. Met all goals except HEP.    Personal Factors and Comorbidities Comorbidity 1;Comorbidity 2;Other    Comorbidities Pulmonary fibrosis, hernia repair, Right lung transplant, OA, h/o left knee pain.    Examination-Activity Limitations Locomotion Level;Other    Examination-Participation Restrictions Other    Stability/Clinical Decision Making Stable/Uncomplicated    Rehab Potential Excellent    PT Frequency 3x / week    PT Duration 4 weeks    PT Treatment/Interventions ADLs/Self Care Home Management;Cryotherapy;Electrical Stimulation;Moist Heat;Gait training;Stair training;Functional mobility training;Therapeutic activities;Therapeutic exercise;Neuromuscular re-education;Manual techniques;Patient/family education;Passive range of motion;Vasopneumatic Device    PT Next Visit Plan D/C    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   Dry eye syndrome of both eyes 03/01/2019   CKD (chronic kidney disease) stage 3, GFR 30-59 ml/min (HCC) 10/22/2018   Immunosuppression ([place]) 10/11/2018   Status post lung transplantation ([place]) 09/16/2018   Bilateral posterior  capsular opacification 07/16/2018   Interstitial lung disease ([place]) 06/25/2018   CAD (coronary artery disease) 09/21/2017   NSIP (nonspecific interstitial pneumonia) ([place]) 12/18/2016   Osteoarthritis of knee 03/13/2015  Heart murmur, systolic    Arthritis    Essential hypertension 01/07/2011   Hyperlipidemia 01/07/2011    Standley Brooking, PTA 05/01/21 2:46 PM   [facility] [place], [place], [address] Phone: [phone]   Fax:  [phone]  Name: Vernon Richard MRN: [id number] Date of Birth: 1951-08-15  PHYSICAL THERAPY DISCHARGE SUMMARY  Visits from Start of Care: 12  Current functional level related to goals / functional outcomes: See above.    Remaining deficits: All goals met.   Education / Equipment: HEP.   Patient agrees to discharge. Patient goals were met. Patient is being discharged due to meeting the stated rehab goals.     [name] Applegate MPT

## 2021-05-02 DIAGNOSIS — L570 Actinic keratosis: Secondary | ICD-10-CM | POA: Diagnosis not present

## 2021-05-06 ENCOUNTER — Encounter: Payer: Self-pay | Admitting: Physical Therapy

## 2021-05-06 ENCOUNTER — Other Ambulatory Visit: Payer: Self-pay

## 2021-05-06 ENCOUNTER — Ambulatory Visit: Payer: Medicare Other | Attending: Family Medicine | Admitting: Physical Therapy

## 2021-05-06 DIAGNOSIS — G8929 Other chronic pain: Secondary | ICD-10-CM | POA: Diagnosis not present

## 2021-05-06 DIAGNOSIS — M5441 Lumbago with sciatica, right side: Secondary | ICD-10-CM | POA: Insufficient documentation

## 2021-05-06 NOTE — Therapy (Signed)
[facility] [place], [place], [address] Phone: [phone]   Fax:  [phone]  Physical Therapy Evaluation  Patient Details  Name: Vernon Richard MRN: [id number] Date of Birth: [birth year] Referring Provider (PT): Maggie Font   Encounter Date: 05/06/2021   PT End of Session - 05/06/21 1534     Visit Number 1    Number of Visits 12    Date for PT Re-Evaluation 08/04/21    Authorization Type FOTO AT LEAST EVERY 5TH VISIT.  PROGRESS NOTE AT 10TH VISIT.  KX MODIFIER AFTER 15 VISITS.    PT Start Time 0145    PT Stop Time 0227    PT Time Calculation (min) 42 min    Activity Tolerance Patient tolerated treatment well    Behavior During Therapy [facility] for tasks assessed/performed             Past Medical History:  Diagnosis Date   Arthritis    OA of both knees   Heart murmur, systolic    Hyperlipidemia    Hypertension    Pulmonary fibrosis ([place]) 05/03/2018    Past Surgical History:  Procedure Laterality Date   CATARACT EXTRACTION, BILATERAL     HERNIA REPAIR     groin   LUNG TRANSPLANT, SINGLE Right 09/15/2018    There were no vitals filed for this visit.    Subjective Assessment - 05/06/21 1416     Subjective COVID-19 screen performed prior to patient entering clinic.  The patient presents to the clinic today with c/o right sided low back radiating down his lateral thigh to the region of his Tibialis Anterior described as an electric pain.  He states the pain started about a year ago and is getting worse.  Sitting decreases his pain and walking increases his pain.  His pain is rated at an 8/10 today.    Pertinent History Pulmonary fibrosis, hernia repair, Right lung transplant, OA, right total knee replacement, h/o left knee pain.    How long can you stand comfortably? Varies.    Patient Stated Goals get out of pain.    Currently in Pain? Yes    Pain Score 8     Pain Location --   Right LE.   Pain  Descriptors / Indicators Aching;Shooting;Sharp   "Electric."   Pain Onset More than a month ago    Pain Frequency Constant    Aggravating Factors  See above.    Pain Relieving Factors See above.                [facility] PT Assessment - 05/06/21 0001       Assessment   Medical Diagnosis Lumbar radicuopathy.    Referring Provider (PT) Maggie Font    Onset Date/Surgical Date --   ~ a year.     Restrictions   Weight Bearing Restrictions No      Balance Screen   Has the patient fallen in the past 6 months No    Has the patient had a decrease in activity level because of a fear of falling?  No    Is the patient reluctant to leave their home because of a fear of falling?  No      Prior Function   Level of Independence Independent      Observation/Other Assessments   Focus on Therapeutic Outcomes (FOTO)  Complete.      Deep Tendon Reflexes   DTR Assessment Site Patella;Achilles    Patella DTR 2+  Achilles DTR 2+      ROM / Strength   AROM / PROM / Strength AROM;Strength      AROM   Overall AROM Comments Full active lumbar flexion and extension is 20 degrees.      Strength   Overall Strength Comments Right hip, knee and ankle is a solid 4+/5.    Right Hip ABduction 4/5      Palpation   Palpation comment Tender in region of right LB, SIJ and upper gluteal region.      Special Tests   Other special tests Right LE shorter then left possible due to a right pelvic posterior rotation.  (+) right SLR.      Ambulation/Gait   Gait Comments Trendelenburg type gait.                        Objective measurements completed on examination: See above findings.       [facility] Adult PT Treatment/Exercise - 05/06/21 0001       Modalities   Modalities Electrical Stimulation;Moist Heat      Moist Heat Therapy   Number Minutes Moist Heat 15 Minutes    Moist Heat Location --   Right low back.     Acupuncturist Location Right  low back.    Electrical Stimulation Action IFC at 80-150 Hz.    Electrical Stimulation Parameters 40% scan x 15 minutes.    Electrical Stimulation Goals Pain                         PT Long Term Goals - 05/06/21 1532       PT LONG TERM GOAL #1   Title Independent with a HEP.    Time 6    Period Weeks    Status New      PT LONG TERM GOAL #2   Title Stand 20 minutes with pain not > 3/10.    Time 6    Period Weeks    Status New      PT LONG TERM GOAL #3   Title Eliminate right LE symptoms.    Time 6    Period Weeks    Status New      PT LONG TERM GOAL #4   Title Perform ADL's with pain not > 3/10.    Time 6    Period Weeks    Status New                    Plan - 05/06/21 1522     Clinical Impression Statement The patient presents to O[facility]T with c/o right sided low back pain with radiation of symptoms down his lateral tight to the region of his right Tibialis Anterior describes as an "electric" shock.  At times the pain can become unbearable.  He demonstrates a positive right SLR test.  He exhibits bilateral hip abduction weakness and has a Trendelenburg-type gait pattern.  His LE DTR'as are intact.  He also exhibits a leg length discrepanacy with his right LE shorter.  Patient will benefit from skilled physical therapy intervention to address pain and deficits.    Personal Factors and Comorbidities Comorbidity 1;Comorbidity 2;Other    Comorbidities Pulmonary fibrosis, hernia repair, Right lung transplant, OA, h/o left knee pain, right TKA.    Examination-Activity Limitations Locomotion Level;Other;Stand    Examination-Participation Restrictions Other    Stability/Clinical Decision Making Evolving/Moderate complexity  Clinical Decision Making Low    Rehab Potential Excellent    PT Frequency 2x / week    PT Duration 6 weeks    PT Treatment/Interventions ADLs/Self Care Home Management;Cryotherapy;Electrical Stimulation;Traction;Ultrasound;Moist  Heat;Functional mobility training;Therapeutic activities;Therapeutic exercise;Manual techniques;Patient/family education;Passive range of motion;Dry needling    PT Next Visit Plan Combo e'stim/US to right low back, SIJ, upper gluteal region, reverse muscle energy technique to equal leg lengths, core exercise progression, may consider lumbar traction beginning at 35% body weight (axillary pads can be used).  Hip abduction strengthening.    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   Dry eye syndrome of both eyes 03/01/2019   CKD (chronic kidney disease) stage 3, GFR 30-59 ml/min (HCC) 10/22/2018   Immunosuppression ([place]) 10/11/2018   Status post lung transplantation ([place]) 09/16/2018   Bilateral posterior capsular opacification 07/16/2018   Interstitial lung disease ([place]) 06/25/2018   CAD (coronary artery disease) 09/21/2017   NSIP (nonspecific interstitial pneumonia) ([place]) 12/18/2016   Osteoarthritis of knee 03/13/2015   Heart murmur, systolic    Arthritis    Essential hypertension 01/07/2011   Hyperlipidemia 01/07/2011    Makinze Jani, [name] MPT 05/06/2021, 3:37 PM  [facility] [address] [place], [place], [address] Phone: [phone]   Fax:  [phone]  Name: JOELL USMAN MRN: [id number] Date of Birth: 18-Jun-1951

## 2021-05-09 ENCOUNTER — Ambulatory Visit: Payer: Medicare Other | Admitting: Physical Therapy

## 2021-05-09 ENCOUNTER — Other Ambulatory Visit: Payer: Self-pay

## 2021-05-09 DIAGNOSIS — R600 Localized edema: Secondary | ICD-10-CM | POA: Diagnosis not present

## 2021-05-09 DIAGNOSIS — M5441 Lumbago with sciatica, right side: Secondary | ICD-10-CM | POA: Diagnosis not present

## 2021-05-09 DIAGNOSIS — M25561 Pain in right knee: Secondary | ICD-10-CM | POA: Diagnosis not present

## 2021-05-09 DIAGNOSIS — G8929 Other chronic pain: Secondary | ICD-10-CM | POA: Diagnosis not present

## 2021-05-09 DIAGNOSIS — M25661 Stiffness of right knee, not elsewhere classified: Secondary | ICD-10-CM | POA: Diagnosis not present

## 2021-05-09 NOTE — Therapy (Signed)
[facility] [place], [place], [address] Phone: [phone]   Fax:  [phone]  Physical Therapy Treatment  Patient Details  Name: Vernon Richard MRN: [id number] Date of Birth: Sep 12, 1951 Referring Provider (PT): Maggie Font   Encounter Date: 05/09/2021   PT End of Session - 05/09/21 1431     Visit Number 2    Number of Visits 12    Date for PT Re-Evaluation 08/04/21    Authorization Type FOTO AT LEAST EVERY 5TH VISIT.  PROGRESS NOTE AT 10TH VISIT.  KX MODIFIER AFTER 15 VISITS.    PT Start Time 0149    PT Stop Time 0236    PT Time Calculation (min) 47 min    Activity Tolerance Patient tolerated treatment well    Behavior During Therapy WFL for tasks assessed/performed             Past Medical History:  Diagnosis Date   Arthritis    OA of both knees   Heart murmur, systolic    Hyperlipidemia    Hypertension    Pulmonary fibrosis ([place]) 05/03/2018    Past Surgical History:  Procedure Laterality Date   CATARACT EXTRACTION, BILATERAL     HERNIA REPAIR     groin   LUNG TRANSPLANT, SINGLE Right 09/15/2018    There were no vitals filed for this visit.   Subjective Assessment - 05/09/21 1429     Subjective COVID-19 screen performed prior to patient entering clinic. Patient arrived with ongoing pain in right low back    Pertinent History Pulmonary fibrosis, hernia repair, Right lung transplant, OA, right total knee replacement, h/o left knee pain.    How long can you stand comfortably? Varies.    How long can you walk comfortably? Walk around home.    Patient Stated Goals get out of pain.    Currently in Pain? Yes    Pain Score 8     Pain Location Back    Pain Orientation Right    Pain Descriptors / Indicators Sore;Discomfort;Sharp    Pain Radiating Towards right LE    Pain Onset More than a month ago    Pain Frequency Constant    Aggravating Factors  walking    Pain Relieving Factors rest                                [facility] Adult PT Treatment/Exercise - 05/09/21 0001       Lumbar Exercises: Aerobic   Nustep L3 x40min UE/LE activity      Lumbar Exercises: Supine   Other Supine Lumbar Exercises supine rev musle technique to right side    Other Supine Lumbar Exercises ball squeeze x30 reps      Modalities   Modalities Electrical Stimulation;Moist Heat;Ultrasound      Moist Heat Therapy   Number Minutes Moist Heat 10 Minutes    Moist Heat Location Lumbar Spine      Electrical Stimulation   Electrical Stimulation Location Right low back.    Electrical Stimulation Action IFC    Electrical Stimulation Parameters 80-150ha x58min    Electrical Stimulation Goals Pain      Ultrasound   Ultrasound Location right SIJ    Ultrasound Parameters combo 1.5w/cm2/100%/2mhz x51min    Ultrasound Goals Pain      Manual Therapy   Manual Therapy Soft tissue mobilization    Soft tissue mobilization manual STW to right low back,  glut and SIJ to reduce pain and tone                         PT Long Term Goals - 05/06/21 1532       PT LONG TERM GOAL #1   Title Independent with a HEP.    Time 6    Period Weeks    Status New      PT LONG TERM GOAL #2   Title Stand 20 minutes with pain not > 3/10.    Time 6    Period Weeks    Status New      PT LONG TERM GOAL #3   Title Eliminate right LE symptoms.    Time 6    Period Weeks    Status New      PT LONG TERM GOAL #4   Title Perform ADL's with pain not > 3/10.    Time 6    Period Weeks    Status New                   Plan - 05/09/21 1435     Clinical Impression Statement Patient tolerated treatment well today. Patient reported most pain with walking and standing and less with rest. Today focused on minimal exercises, perfromed manual STW and modalities to reduce pain and tone. Patient responded well per removal of modalities.    Personal Factors and Comorbidities Comorbidity  1;Comorbidity 2;Other    Comorbidities Pulmonary fibrosis, hernia repair, Right lung transplant, OA, h/o left knee pain, right TKA.    Examination-Activity Limitations Locomotion Level;Other;Stand    Examination-Participation Restrictions Other    Stability/Clinical Decision Making Evolving/Moderate complexity    Rehab Potential Excellent    PT Frequency 2x / week    PT Duration 6 weeks    PT Treatment/Interventions ADLs/Self Care Home Management;Cryotherapy;Electrical Stimulation;Traction;Ultrasound;Moist Heat;Functional mobility training;Therapeutic activities;Therapeutic exercise;Manual techniques;Patient/family education;Passive range of motion;Dry needling    PT Next Visit Plan Combo e'stim/US to right low back, SIJ, upper gluteal region, reverse muscle energy technique to equal leg lengths, core exercise progression, may consider lumbar traction beginning at 35% body weight (axillary pads can be used).  Hip abduction strengthening.    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[place]) 07/21/2019   Dry eye syndrome of both eyes 03/01/2019   CKD (chronic kidney disease) stage 3, GFR 30-59 ml/min (HCC) 10/22/2018   Immunosuppression ([place]) 10/11/2018   Status post lung transplantation ([place]) 09/16/2018   Bilateral posterior capsular opacification 07/16/2018   Interstitial lung disease ([place]) 06/25/2018   CAD (coronary artery disease) 09/21/2017   NSIP (nonspecific interstitial pneumonia) ([place]) 12/18/2016    Osteoarthritis of knee 03/13/2015   Heart murmur, systolic    Arthritis    Essential hypertension 01/07/2011   Hyperlipidemia 01/07/2011    Rhonda Linan P, PTA 05/09/2021, 2:40 PM  [facility] [address] [place], [place], [address] Phone: [phone]   Fax:  [phone]  Name: Vernon Richard  [place] MRN: [id number] Date of Birth: 1951/09/05

## 2021-05-13 ENCOUNTER — Encounter: Payer: Self-pay | Admitting: Physical Therapy

## 2021-05-13 ENCOUNTER — Other Ambulatory Visit: Payer: Self-pay

## 2021-05-13 ENCOUNTER — Ambulatory Visit: Payer: Medicare Other | Admitting: Physical Therapy

## 2021-05-13 DIAGNOSIS — M25661 Stiffness of right knee, not elsewhere classified: Secondary | ICD-10-CM | POA: Diagnosis not present

## 2021-05-13 DIAGNOSIS — M25561 Pain in right knee: Secondary | ICD-10-CM | POA: Diagnosis not present

## 2021-05-13 DIAGNOSIS — M5441 Lumbago with sciatica, right side: Secondary | ICD-10-CM

## 2021-05-13 DIAGNOSIS — G8929 Other chronic pain: Secondary | ICD-10-CM | POA: Diagnosis not present

## 2021-05-13 DIAGNOSIS — R600 Localized edema: Secondary | ICD-10-CM | POA: Diagnosis not present

## 2021-05-13 NOTE — Therapy (Signed)
[facility] [place], [place], [address] Phone: [phone]   Fax:  [phone]  Physical Therapy Treatment  Patient Details  Name: Vernon Richard MRN: [id number] Date of Birth: March 27, 1951 Referring Provider (PT): Maggie Font   Encounter Date: 05/13/2021   PT End of Session - 05/13/21 1350     Visit Number 3    Number of Visits 12    Date for PT Re-Evaluation 08/04/21    Authorization Type FOTO AT LEAST EVERY 5TH VISIT.  PROGRESS NOTE AT 10TH VISIT.  KX MODIFIER AFTER 15 VISITS.    PT Start Time 1348    PT Stop Time 1432    PT Time Calculation (min) 44 min    Activity Tolerance Patient tolerated treatment well    Behavior During Therapy WFL for tasks assessed/performed             Past Medical History:  Diagnosis Date   Arthritis    OA of both knees   Heart murmur, systolic    Hyperlipidemia    Hypertension    Pulmonary fibrosis ([place]) 05/03/2018    Past Surgical History:  Procedure Laterality Date   CATARACT EXTRACTION, BILATERAL     HERNIA REPAIR     groin   LUNG TRANSPLANT, SINGLE Right 09/15/2018    There were no vitals filed for this visit.   Subjective Assessment - 05/13/21 1348     Subjective COVID-19 screen performed prior to patient entering clinic. Patient arrived with ongoing pain in right low back but seems some better. States thta his L knee is bothering him more in the last week.    Pertinent History Pulmonary fibrosis, hernia repair, Right lung transplant, OA, right total knee replacement, h/o left knee pain.    How long can you stand comfortably? Varies.    How long can you walk comfortably? Walk around home.    Patient Stated Goals get out of pain.    Currently in Pain? Yes    Pain Score 6     Pain Location Back    Pain Orientation Right;Lower    Pain Descriptors / Indicators Discomfort    Pain Type Surgical pain    Pain Onset More than a month ago    Pain Frequency Constant                 [facility] PT Assessment - 05/13/21 0001       Assessment   Medical Diagnosis Lumbar radicuopathy.    Referring Provider (PT) Maggie Font    Next MD Visit 05/14/2021                           [facility] Adult PT Treatment/Exercise - 05/13/21 0001       Lumbar Exercises: Stretches   Single Knee to Chest Stretch Right;2 reps;30 seconds    Figure 4 Stretch 2 reps;30 seconds;Supine;Without overpressure      Lumbar Exercises: Aerobic   Nustep L3 x14 min      Lumbar Exercises: Supine   Bridge with Ball Squeeze 15 reps;4 seconds    Other Supine Lumbar Exercises Adductor squeeze x10 reps 5 sec holds      Lumbar Exercises: Sidelying   Clam Right;20 reps      Modalities   Modalities Electrical Stimulation;Moist Heat;Ultrasound      Moist Heat Therapy   Number Minutes Moist Heat 15 Minutes    Moist Heat Location Lumbar Spine  Acupuncturist Location Right low back.    Electrical Stimulation Action IFC    Electrical Stimulation Parameters 80-150 hz x15 min    Electrical Stimulation Goals Pain                         PT Long Term Goals - 05/06/21 1532       PT LONG TERM GOAL #1   Title Independent with a HEP.    Time 6    Period Weeks    Status New      PT LONG TERM GOAL #2   Title Stand 20 minutes with pain not > 3/10.    Time 6    Period Weeks    Status New      PT LONG TERM GOAL #3   Title Eliminate right LE symptoms.    Time 6    Period Weeks    Status New      PT LONG TERM GOAL #4   Title Perform ADL's with pain not > 3/10.    Time 6    Period Weeks    Status New                   Plan - 05/13/21 1424     Clinical Impression Statement Patient presented in clinic with reports of less overall R LBP. Patient having more difficulty in L knee but knows it should be replaced soon. Patient able to tolerate all stretching and strengthening exercises as directed with no  complaints of increased pain. Normal modalities response noted following removal of the modalities.    Personal Factors and Comorbidities Comorbidity 1;Comorbidity 2;Other    Comorbidities Pulmonary fibrosis, hernia repair, Right lung transplant, OA, h/o left knee pain, right TKA.    Examination-Activity Limitations Locomotion Level;Other;Stand    Examination-Participation Restrictions Other    Stability/Clinical Decision Making Evolving/Moderate complexity    Rehab Potential Excellent    PT Frequency 2x / week    PT Duration 6 weeks    PT Treatment/Interventions ADLs/Self Care Home Management;Cryotherapy;Electrical Stimulation;Traction;Ultrasound;Moist Heat;Functional mobility training;Therapeutic activities;Therapeutic exercise;Manual techniques;Patient/family education;Passive range of motion;Dry needling    PT Next Visit Plan Combo e'stim/US to right low back, SIJ, upper gluteal region, reverse muscle energy technique to equal leg lengths, core exercise progression, may consider lumbar traction beginning at 35% body weight (axillary pads can be used).  Hip abduction strengthening.    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[place]) 07/21/2019   Dry eye syndrome of both eyes 03/01/2019   CKD (chronic kidney disease) stage 3, GFR 30-59 ml/min (HCC) 10/22/2018   Immunosuppression ([place]) 10/11/2018   Status post lung transplantation ([place]) 09/16/2018   Bilateral posterior capsular opacification 07/16/2018   Interstitial  lung disease ([place]) 06/25/2018   CAD (coronary artery disease) 09/21/2017   NSIP (nonspecific interstitial pneumonia) ([place]) 12/18/2016   Osteoarthritis of knee 03/13/2015   Heart murmur, systolic    Arthritis    Essential hypertension 01/07/2011  Hyperlipidemia 01/07/2011    Standley Brooking, PTA 05/13/2021, 3:08 PM  [facility] [address] [place], [place], [address] Phone: [phone]   Fax:  [phone]  Name: Vernon Richard MRN: [id number] Date of Birth: 1950/12/12

## 2021-05-16 ENCOUNTER — Ambulatory Visit: Payer: Medicare Other | Admitting: Physical Therapy

## 2021-05-16 ENCOUNTER — Other Ambulatory Visit: Payer: Self-pay

## 2021-05-16 ENCOUNTER — Encounter: Payer: Self-pay | Admitting: Physical Therapy

## 2021-05-16 DIAGNOSIS — G8929 Other chronic pain: Secondary | ICD-10-CM

## 2021-05-16 DIAGNOSIS — M5441 Lumbago with sciatica, right side: Secondary | ICD-10-CM | POA: Diagnosis not present

## 2021-05-16 NOTE — Therapy (Signed)
[facility] [place], [place], [address] Phone: [phone]   Fax:  [phone]  Physical Therapy Treatment  Patient Details  Name: Vernon Richard MRN: [id number] Date of Birth: [birth year] Referring Provider (PT): Maggie Font   Encounter Date: 05/16/2021   PT End of Session - 05/16/21 [id number]     Visit Number 4    Number of Visits 12    Date for PT Re-Evaluation 08/04/21    Authorization Type FOTO AT LEAST EVERY 5TH VISIT.  PROGRESS NOTE AT 10TH VISIT.  KX MODIFIER AFTER 15 VISITS.    PT Start Time 1431    PT Stop Time 1518    PT Time Calculation (min) 47 min    Activity Tolerance Patient tolerated treatment well    Behavior During Therapy WFL for tasks assessed/performed             Past Medical History:  Diagnosis Date   Arthritis    OA of both knees   Heart murmur, systolic    Hyperlipidemia    Hypertension    Pulmonary fibrosis ([place]) 05/03/2018    Past Surgical History:  Procedure Laterality Date   CATARACT EXTRACTION, BILATERAL     HERNIA REPAIR     groin   LUNG TRANSPLANT, SINGLE Right 09/15/2018    There were no vitals filed for this visit.   Subjective Assessment - 05/16/21 1432     Subjective COVID-19 screen performed prior to patient entering clinic. Reports some discomfort at times but fairly good right now.    Pertinent History Pulmonary fibrosis, hernia repair, Right lung transplant, OA, right total knee replacement, h/o left knee pain.    How long can you stand comfortably? Varies.    How long can you walk comfortably? Walk around home.    Patient Stated Goals get out of pain.    Currently in Pain? Yes    Pain Score 6     Pain Location Back    Pain Orientation Right;Lower    Pain Descriptors / Indicators Discomfort    Pain Type Surgical pain    Pain Radiating Towards RLE to ankle intermittant (occasional)    Pain Onset More than a month ago    Pain Frequency Constant                 [facility] PT Assessment - 05/16/21 0001       Assessment   Medical Diagnosis Lumbar radicuopathy.    Referring Provider (PT) Maggie Font    Next MD Visit None                           [facility] Adult PT Treatment/Exercise - 05/16/21 0001       Lumbar Exercises: Aerobic   Nustep L3 x17 min      Modalities   Modalities Electrical Stimulation;Moist Heat;Ultrasound      Moist Heat Therapy   Number Minutes Moist Heat 15 Minutes    Moist Heat Location Lumbar Spine      Electrical Stimulation   Electrical Stimulation Location B low back    Electrical Stimulation Action IFC    Electrical Stimulation Parameters 80-150 hz x15 min    Electrical Stimulation Goals Pain      Ultrasound   Ultrasound Location R SI joint    Ultrasound Parameters Combo 1.5 w/cm2, 100%, 1 mhz x10 min    Ultrasound Goals Pain  PT Long Term Goals - 05/06/21 1532       PT LONG TERM GOAL #1   Title Independent with a HEP.    Time 6    Period Weeks    Status New      PT LONG TERM GOAL #2   Title Stand 20 minutes with pain not > 3/10.    Time 6    Period Weeks    Status New      PT LONG TERM GOAL #3   Title Eliminate right LE symptoms.    Time 6    Period Weeks    Status New      PT LONG TERM GOAL #4   Title Perform ADL's with pain not > 3/10.    Time 6    Period Weeks    Status New                   Plan - 05/16/21 1737     Clinical Impression Statement Patient presented in clinic with continued mid level R SI joint pain. Patient reports occasionally pain does radiate to RLE and ankle but does say that pain is better. Patient is to have L TKR on 06/07/2021 and is hopeful that that will also subdue lumbosacral symptoms. Normal modalities response noted following removal of the modalities.    Personal Factors and Comorbidities Comorbidity 1;Comorbidity 2;Other    Comorbidities Pulmonary fibrosis, hernia repair, Right lung  transplant, OA, h/o left knee pain, right TKA.    Examination-Activity Limitations Locomotion Level;Other;Stand    Examination-Participation Restrictions Other    Stability/Clinical Decision Making Evolving/Moderate complexity    Rehab Potential Excellent    PT Frequency 2x / week    PT Duration 6 weeks    PT Treatment/Interventions ADLs/Self Care Home Management;Cryotherapy;Electrical Stimulation;Traction;Ultrasound;Moist Heat;Functional mobility training;Therapeutic activities;Therapeutic exercise;Manual techniques;Patient/family education;Passive range of motion;Dry needling    PT Next Visit Plan Combo e'stim/US to right low back, SIJ, upper gluteal region, reverse muscle energy technique to equal leg lengths, core exercise progression, may consider lumbar traction beginning at 35% body weight (axillary pads can be used).  Hip abduction strengthening.    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/2019   Dry eye syndrome of both eyes 03/01/2019   CKD (chronic kidney disease) stage 3, GFR 30-59 ml/min (HCC) 10/22/2018   Immunosuppression ([place]) 10/11/2018   Status post lung transplantation ([place]) 09/16/2018   Bilateral posterior capsular opacification 07/16/2018   Interstitial lung disease ([place]) 06/25/2018   CAD (coronary artery disease) 09/21/2017   NSIP (nonspecific interstitial pneumonia) ([place]) 12/18/2016   Osteoarthritis of knee 03/13/2015   Heart murmur, systolic    Arthritis    Essential  hypertension 01/07/2011   Hyperlipidemia 01/07/2011    Standley Brooking, PTA 05/16/2021, 5:40 PM  [facility] [address] [place], [place], [address] Phone: [phone]   Fax:  [phone]  Name: Vernon Richard MRN: [id number] Date of Birth: 12/22/1950

## 2021-05-22 ENCOUNTER — Ambulatory Visit: Payer: Medicare Other | Admitting: Physical Therapy

## 2021-05-24 ENCOUNTER — Other Ambulatory Visit: Payer: Self-pay

## 2021-05-24 ENCOUNTER — Ambulatory Visit: Payer: Medicare Other | Attending: Family Medicine | Admitting: *Deleted

## 2021-05-24 DIAGNOSIS — M5441 Lumbago with sciatica, right side: Secondary | ICD-10-CM | POA: Diagnosis not present

## 2021-05-24 DIAGNOSIS — G8929 Other chronic pain: Secondary | ICD-10-CM | POA: Insufficient documentation

## 2021-05-24 NOTE — Therapy (Signed)
[facility] [place], [place], [address] Phone: [phone]   Fax:  [phone]  Physical Therapy Treatment  Patient Details  Name: Vernon Richard MRN: [id number] Date of Birth: 25-Jun-1951 Referring Provider (PT): Maggie Font   Encounter Date: 05/24/2021   PT End of Session - 05/24/21 0956     Visit Number 5    Number of Visits 12    Date for PT Re-Evaluation 08/04/21    Authorization Type FOTO AT LEAST EVERY 5TH VISIT.  PROGRESS NOTE AT 10TH VISIT.  KX MODIFIER AFTER 15 VISITS.    PT Start Time 0945    PT Stop Time [id number]    PT Time Calculation (min) 50 min             Past Medical History:  Diagnosis Date   Arthritis    OA of both knees   Heart murmur, systolic    Hyperlipidemia    Hypertension    Pulmonary fibrosis ([place]) 05/03/2018    Past Surgical History:  Procedure Laterality Date   CATARACT EXTRACTION, BILATERAL     HERNIA REPAIR     groin   LUNG TRANSPLANT, SINGLE Right 09/15/2018    There were no vitals filed for this visit.   Subjective Assessment - 05/24/21 0953     Subjective COVID-19 screen performed prior to patient entering clinic. Reports some discomfort at times but fairly good right now.    Pertinent History Pulmonary fibrosis, hernia repair, Right lung transplant, OA, right total knee replacement, h/o left knee pain.    How long can you stand comfortably? Varies.    How long can you walk comfortably? Walk around home.    Patient Stated Goals get out of pain.    Currently in Pain? Yes    Pain Score 5     Pain Location Back    Pain Orientation Right;Lower    Pain Type Surgical pain    Pain Onset More than a month ago                               [facility] Adult PT Treatment/Exercise - 05/24/21 0001       Lumbar Exercises: Aerobic   Nustep L3 x12 min      Modalities   Modalities Electrical Stimulation;Moist Heat;Ultrasound      Moist Heat Therapy   Moist  Heat Location Lumbar Spine      Electrical Stimulation   Electrical Stimulation Location RT SIJ    Electrical Stimulation Action premod    Electrical Stimulation Parameters 80-'150hz'$  x 15 mins hooklying    Electrical Stimulation Goals Pain      Ultrasound   Ultrasound Location RT SIJ    Ultrasound Parameters [place] 1.5 w/cm2 x 10 mins LT sidelying    Ultrasound Goals Pain      Manual Therapy   Manual Therapy Soft tissue mobilization    Soft tissue mobilization manual STW to right low back, and  SIJ to reduce pain and tone                         PT Long Term Goals - 05/06/21 1532       PT LONG TERM GOAL #1   Title Independent with a HEP.    Time 6    Period Weeks    Status New      PT LONG TERM GOAL #2  Title Stand 20 minutes with pain not > 3/10.    Time 6    Period Weeks    Status New      PT LONG TERM GOAL #3   Title Eliminate right LE symptoms.    Time 6    Period Weeks    Status New      PT LONG TERM GOAL #4   Title Perform ADL's with pain not > 3/10.    Time 6    Period Weeks    Status New                   Plan - 05/24/21 1032     Clinical Impression Statement Pt arrived today doing fairly well with decreased pain in RT SIJ. He did well with Rx and reports that the RT SIJ is wher his pain is and does produce some RT glute symptoms. He has the most pain with walking and WTB on RT side. Felt good after RX. Pt reports needing to DC after next RX due to having LT TKR .    Personal Factors and Comorbidities Comorbidity 1;Comorbidity 2;Other    Comorbidities Pulmonary fibrosis, hernia repair, Right lung transplant, OA, h/o left knee pain, right TKA.    Stability/Clinical Decision Making Evolving/Moderate complexity    Rehab Potential Excellent    PT Frequency 2x / week    PT Duration 6 weeks    PT Treatment/Interventions ADLs/Self Care Home Management;Cryotherapy;Electrical Stimulation;Traction;Ultrasound;Moist Heat;Functional mobility  training;Therapeutic activities;Therapeutic exercise;Manual techniques;Patient/family education;Passive range of motion;Dry needling    PT Next Visit Plan DC after next visit due to having LT TKR             Combo e'stim/US to right low back, SIJ, upper gluteal region, reverse muscle energy technique to equal leg lengths, core exercise progression, may consider lumbar traction beginning at 35% body weight (axillary pads can be used).  Hip abduction strengthening.    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[place]) 07/21/2019   Dry eye syndrome of both eyes 03/01/2019   CKD (chronic kidney disease) stage 3, GFR 30-59 ml/min (HCC) 10/22/2018   Immunosuppression ([place]) 10/11/2018   Status post lung transplantation ([place]) 09/16/2018   Bilateral posterior capsular opacification 07/16/2018   Interstitial lung disease ([place]) 06/25/2018   CAD (coronary artery disease) 09/21/2017   NSIP (nonspecific interstitial pneumonia) ([place]) 12/18/2016   Osteoarthritis of knee 03/13/2015   Heart murmur, systolic    Arthritis    Essential hypertension 01/07/2011   Hyperlipidemia 01/07/2011    Carrye Goller,CHRIS 05/24/2021, 10:51 AM  [facility] [address] [place], [place], [address] Phone: [phone]   Fax:  [phone]  Name: Vernon Richard MRN: [id number] Date of Birth: [birth year]

## 2021-05-27 DIAGNOSIS — Z298 Encounter for other specified prophylactic measures: Secondary | ICD-10-CM | POA: Diagnosis not present

## 2021-05-27 DIAGNOSIS — T86819 Unspecified complication of lung transplant: Secondary | ICD-10-CM | POA: Diagnosis not present

## 2021-05-27 DIAGNOSIS — Z79899 Other long term (current) drug therapy: Secondary | ICD-10-CM | POA: Diagnosis not present

## 2021-05-27 DIAGNOSIS — Z5181 Encounter for therapeutic drug level monitoring: Secondary | ICD-10-CM | POA: Diagnosis not present

## 2021-05-28 DIAGNOSIS — M25762 Osteophyte, left knee: Secondary | ICD-10-CM | POA: Diagnosis not present

## 2021-05-28 DIAGNOSIS — D801 Nonfamilial hypogammaglobulinemia: Secondary | ICD-10-CM | POA: Diagnosis not present

## 2021-05-28 DIAGNOSIS — Z01818 Encounter for other preprocedural examination: Secondary | ICD-10-CM | POA: Diagnosis not present

## 2021-05-28 DIAGNOSIS — J84112 Idiopathic pulmonary fibrosis: Secondary | ICD-10-CM | POA: Diagnosis not present

## 2021-05-28 DIAGNOSIS — M25569 Pain in unspecified knee: Secondary | ICD-10-CM | POA: Diagnosis not present

## 2021-05-28 DIAGNOSIS — Z942 Lung transplant status: Secondary | ICD-10-CM | POA: Diagnosis not present

## 2021-05-28 DIAGNOSIS — K219 Gastro-esophageal reflux disease without esophagitis: Secondary | ICD-10-CM | POA: Diagnosis not present

## 2021-05-28 DIAGNOSIS — R600 Localized edema: Secondary | ICD-10-CM | POA: Diagnosis not present

## 2021-05-28 DIAGNOSIS — M1712 Unilateral primary osteoarthritis, left knee: Secondary | ICD-10-CM | POA: Diagnosis not present

## 2021-05-28 DIAGNOSIS — N182 Chronic kidney disease, stage 2 (mild): Secondary | ICD-10-CM | POA: Diagnosis not present

## 2021-05-28 DIAGNOSIS — E785 Hyperlipidemia, unspecified: Secondary | ICD-10-CM | POA: Diagnosis not present

## 2021-05-28 DIAGNOSIS — I10 Essential (primary) hypertension: Secondary | ICD-10-CM | POA: Diagnosis not present

## 2021-05-29 ENCOUNTER — Ambulatory Visit: Payer: Medicare Other | Admitting: Physical Therapy

## 2021-05-29 ENCOUNTER — Encounter: Payer: Self-pay | Admitting: Physical Therapy

## 2021-05-29 ENCOUNTER — Other Ambulatory Visit: Payer: Self-pay

## 2021-05-29 DIAGNOSIS — M5441 Lumbago with sciatica, right side: Secondary | ICD-10-CM | POA: Diagnosis not present

## 2021-05-29 DIAGNOSIS — G8929 Other chronic pain: Secondary | ICD-10-CM

## 2021-05-29 NOTE — Therapy (Addendum)
[facility] [place], [place], [address] Phone: [phone]   Fax:  [phone]  Physical Therapy Treatment  Patient Details  Name: Vernon Richard MRN: [id number] Date of Birth: 1950/11/14 Referring Provider (PT): Maggie Font   Encounter Date: 05/29/2021   PT End of Session - 05/29/21 1354     Visit Number 6    Number of Visits 12    Date for PT Re-Evaluation 08/04/21    Authorization Type FOTO AT LEAST EVERY 5TH VISIT.  PROGRESS NOTE AT 10TH VISIT.  KX MODIFIER AFTER 15 VISITS.    PT Start Time 0145    PT Stop Time 0236    PT Time Calculation (min) 51 min    Activity Tolerance Patient tolerated treatment well    Behavior During Therapy WFL for tasks assessed/performed             Past Medical History:  Diagnosis Date   Arthritis    OA of both knees   Heart murmur, systolic    Hyperlipidemia    Hypertension    Pulmonary fibrosis ([place]) 05/03/2018    Past Surgical History:  Procedure Laterality Date   CATARACT EXTRACTION, BILATERAL     HERNIA REPAIR     groin   LUNG TRANSPLANT, SINGLE Right 09/15/2018    There were no vitals filed for this visit.   Subjective Assessment - 05/29/21 1354     Subjective COVID-19 screen performed prior to patient entering clinic.  Pain at a 7 today.    Pertinent History Pulmonary fibrosis, hernia repair, Right lung transplant, OA, right total knee replacement, h/o left knee pain.    How long can you stand comfortably? Varies.    How long can you walk comfortably? Walk around home.    Patient Stated Goals get out of pain.    Currently in Pain? Yes    Pain Score 7     Pain Location Back    Pain Orientation Right;Lower    Pain Descriptors / Indicators Discomfort    Pain Type Surgical pain    Pain Onset More than a month ago                               [facility] Adult PT Treatment/Exercise - 05/29/21 0001       Exercises   Exercises Knee/Hip       Lumbar Exercises: Aerobic   Recumbent Bike Level 3 x 15 minutes.      Modalities   Modalities Electrical Stimulation;Moist Heat      Moist Heat Therapy   Number Minutes Moist Heat 20 Minutes    Moist Heat Location Lumbar Spine      Electrical Stimulation   Electrical Stimulation Location RT SIJ    Electrical Stimulation Action Pre-mod.    Electrical Stimulation Parameters 80-150 Hz. x 20 minutes.    Electrical Stimulation Goals Pain      Manual Therapy   Manual Therapy Soft tissue mobilization    Soft tissue mobilization Left sdly position with pillow between knees for comfort:  STW/M x 8 minutes to patient's right SIJ region.                         PT Long Term Goals - 05/06/21 1532       PT LONG TERM GOAL #1   Title Independent with a HEP.    Time 6  Period Weeks    Status New      PT LONG TERM GOAL #2   Title Stand 20 minutes with pain not > 3/10.    Time 6    Period Weeks    Status New      PT LONG TERM GOAL #3   Title Eliminate right LE symptoms.    Time 6    Period Weeks    Status New      PT LONG TERM GOAL #4   Title Perform ADL's with pain not > 3/10.    Time 6    Period Weeks    Status New                   Plan - 05/29/21 1421     Clinical Impression Statement Patient with increased pain today with most pain localized to his right SIJ.  Patient felt better after treatment    Personal Factors and Comorbidities Comorbidity 1;Comorbidity 2;Other    Comorbidities Pulmonary fibrosis, hernia repair, Right lung transplant, OA, h/o left knee pain, right TKA.    Examination-Participation Restrictions Other    Stability/Clinical Decision Making Evolving/Moderate complexity    Rehab Potential Excellent    PT Frequency 2x / week    PT Duration 6 weeks    PT Treatment/Interventions ADLs/Self Care Home Management;Cryotherapy;Electrical Stimulation;Traction;Ultrasound;Moist Heat;Functional mobility training;Therapeutic  activities;Therapeutic exercise;Manual techniques;Patient/family education;Passive range of motion;Dry needling    PT Next Visit Plan DC after next visit due to having LT TKR             Combo e'stim/US to right low back, SIJ, upper gluteal region, reverse muscle energy technique to equal leg lengths, core exercise progression, may consider lumbar traction beginning at 35% body weight (axillary pads can be used).  Hip abduction strengthening.    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[place]) 07/21/2019   Dry eye syndrome of both eyes 03/01/2019   CKD (chronic kidney disease) stage 3, GFR 30-59 ml/min (HCC) 10/22/2018   Immunosuppression ([place]) 10/11/2018   Status post lung transplantation ([place]) 09/16/2018   Bilateral posterior capsular opacification 07/16/2018   Interstitial lung disease ([place]) 06/25/2018   CAD (coronary artery disease) 09/21/2017   NSIP (nonspecific interstitial pneumonia) ([place]) 12/18/2016   Osteoarthritis of knee 03/13/2015   Heart murmur, systolic    Arthritis    Essential hypertension 01/07/2011   Hyperlipidemia 01/07/2011   PHYSICAL THERAPY DISCHARGE SUMMARY  Visits from Start of Care: 6.  Current functional level related to goals / functional outcomes: See above.   Remaining deficits: See below.   Education / Equipment: HEP.   Patient agrees to discharge. Patient goals were not met. Patient is being discharged due to not returning since the last visit.  [name], [name]  MPT 05/29/2021, 2:42 PM  [facility] [address] [place], [place], [address] Phone: [phone]   Fax:  [phone]  Name: Vernon Richard MRN: [id number] Date of Birth: [birth year]

## 2021-06-07 DIAGNOSIS — I2510 Atherosclerotic heart disease of native coronary artery without angina pectoris: Secondary | ICD-10-CM | POA: Diagnosis not present

## 2021-06-07 DIAGNOSIS — Z7982 Long term (current) use of aspirin: Secondary | ICD-10-CM | POA: Diagnosis not present

## 2021-06-07 DIAGNOSIS — G8918 Other acute postprocedural pain: Secondary | ICD-10-CM | POA: Diagnosis not present

## 2021-06-07 DIAGNOSIS — Z888 Allergy status to other drugs, medicaments and biological substances status: Secondary | ICD-10-CM | POA: Diagnosis not present

## 2021-06-07 DIAGNOSIS — Z886 Allergy status to analgesic agent status: Secondary | ICD-10-CM | POA: Diagnosis not present

## 2021-06-07 DIAGNOSIS — Z96642 Presence of left artificial hip joint: Secondary | ICD-10-CM | POA: Diagnosis not present

## 2021-06-07 DIAGNOSIS — Z833 Family history of diabetes mellitus: Secondary | ICD-10-CM | POA: Diagnosis not present

## 2021-06-07 DIAGNOSIS — E785 Hyperlipidemia, unspecified: Secondary | ICD-10-CM | POA: Diagnosis not present

## 2021-06-07 DIAGNOSIS — M1712 Unilateral primary osteoarthritis, left knee: Secondary | ICD-10-CM | POA: Diagnosis not present

## 2021-06-07 DIAGNOSIS — Z809 Family history of malignant neoplasm, unspecified: Secondary | ICD-10-CM | POA: Diagnosis not present

## 2021-06-07 DIAGNOSIS — Z471 Aftercare following joint replacement surgery: Secondary | ICD-10-CM | POA: Diagnosis not present

## 2021-06-07 DIAGNOSIS — I10 Essential (primary) hypertension: Secondary | ICD-10-CM | POA: Diagnosis not present

## 2021-06-07 DIAGNOSIS — Z79899 Other long term (current) drug therapy: Secondary | ICD-10-CM | POA: Diagnosis not present

## 2021-06-07 DIAGNOSIS — Z85828 Personal history of other malignant neoplasm of skin: Secondary | ICD-10-CM | POA: Diagnosis not present

## 2021-06-07 DIAGNOSIS — K219 Gastro-esophageal reflux disease without esophagitis: Secondary | ICD-10-CM | POA: Diagnosis not present

## 2021-06-08 DIAGNOSIS — I2510 Atherosclerotic heart disease of native coronary artery without angina pectoris: Secondary | ICD-10-CM | POA: Diagnosis not present

## 2021-06-08 DIAGNOSIS — Z809 Family history of malignant neoplasm, unspecified: Secondary | ICD-10-CM | POA: Diagnosis not present

## 2021-06-08 DIAGNOSIS — Z85828 Personal history of other malignant neoplasm of skin: Secondary | ICD-10-CM | POA: Diagnosis not present

## 2021-06-08 DIAGNOSIS — I10 Essential (primary) hypertension: Secondary | ICD-10-CM | POA: Diagnosis not present

## 2021-06-08 DIAGNOSIS — K219 Gastro-esophageal reflux disease without esophagitis: Secondary | ICD-10-CM | POA: Diagnosis not present

## 2021-06-08 DIAGNOSIS — E785 Hyperlipidemia, unspecified: Secondary | ICD-10-CM | POA: Diagnosis not present

## 2021-06-08 DIAGNOSIS — Z79899 Other long term (current) drug therapy: Secondary | ICD-10-CM | POA: Diagnosis not present

## 2021-06-08 DIAGNOSIS — Z7982 Long term (current) use of aspirin: Secondary | ICD-10-CM | POA: Diagnosis not present

## 2021-06-08 DIAGNOSIS — M1712 Unilateral primary osteoarthritis, left knee: Secondary | ICD-10-CM | POA: Diagnosis not present

## 2021-06-08 DIAGNOSIS — Z886 Allergy status to analgesic agent status: Secondary | ICD-10-CM | POA: Diagnosis not present

## 2021-06-08 DIAGNOSIS — Z833 Family history of diabetes mellitus: Secondary | ICD-10-CM | POA: Diagnosis not present

## 2021-06-10 DIAGNOSIS — I10 Essential (primary) hypertension: Secondary | ICD-10-CM | POA: Diagnosis not present

## 2021-06-10 DIAGNOSIS — M1711 Unilateral primary osteoarthritis, right knee: Secondary | ICD-10-CM | POA: Diagnosis not present

## 2021-06-10 DIAGNOSIS — I2510 Atherosclerotic heart disease of native coronary artery without angina pectoris: Secondary | ICD-10-CM | POA: Diagnosis not present

## 2021-06-10 DIAGNOSIS — Z471 Aftercare following joint replacement surgery: Secondary | ICD-10-CM | POA: Diagnosis not present

## 2021-06-10 DIAGNOSIS — Z96652 Presence of left artificial knee joint: Secondary | ICD-10-CM | POA: Diagnosis not present

## 2021-06-12 DIAGNOSIS — I10 Essential (primary) hypertension: Secondary | ICD-10-CM | POA: Diagnosis not present

## 2021-06-12 DIAGNOSIS — Z471 Aftercare following joint replacement surgery: Secondary | ICD-10-CM | POA: Diagnosis not present

## 2021-06-12 DIAGNOSIS — I2510 Atherosclerotic heart disease of native coronary artery without angina pectoris: Secondary | ICD-10-CM | POA: Diagnosis not present

## 2021-06-12 DIAGNOSIS — M1711 Unilateral primary osteoarthritis, right knee: Secondary | ICD-10-CM | POA: Diagnosis not present

## 2021-06-12 DIAGNOSIS — Z96652 Presence of left artificial knee joint: Secondary | ICD-10-CM | POA: Diagnosis not present

## 2021-06-14 DIAGNOSIS — I2510 Atherosclerotic heart disease of native coronary artery without angina pectoris: Secondary | ICD-10-CM | POA: Diagnosis not present

## 2021-06-14 DIAGNOSIS — Z471 Aftercare following joint replacement surgery: Secondary | ICD-10-CM | POA: Diagnosis not present

## 2021-06-14 DIAGNOSIS — Z96652 Presence of left artificial knee joint: Secondary | ICD-10-CM | POA: Diagnosis not present

## 2021-06-14 DIAGNOSIS — I10 Essential (primary) hypertension: Secondary | ICD-10-CM | POA: Diagnosis not present

## 2021-06-14 DIAGNOSIS — M1711 Unilateral primary osteoarthritis, right knee: Secondary | ICD-10-CM | POA: Diagnosis not present

## 2021-06-17 DIAGNOSIS — I2510 Atherosclerotic heart disease of native coronary artery without angina pectoris: Secondary | ICD-10-CM | POA: Diagnosis not present

## 2021-06-17 DIAGNOSIS — Z96652 Presence of left artificial knee joint: Secondary | ICD-10-CM | POA: Diagnosis not present

## 2021-06-17 DIAGNOSIS — I10 Essential (primary) hypertension: Secondary | ICD-10-CM | POA: Diagnosis not present

## 2021-06-17 DIAGNOSIS — M1711 Unilateral primary osteoarthritis, right knee: Secondary | ICD-10-CM | POA: Diagnosis not present

## 2021-06-17 DIAGNOSIS — Z471 Aftercare following joint replacement surgery: Secondary | ICD-10-CM | POA: Diagnosis not present

## 2021-06-20 DIAGNOSIS — T86818 Other complications of lung transplant: Secondary | ICD-10-CM | POA: Diagnosis not present

## 2021-06-20 DIAGNOSIS — Z79899 Other long term (current) drug therapy: Secondary | ICD-10-CM | POA: Diagnosis not present

## 2021-06-20 DIAGNOSIS — D849 Immunodeficiency, unspecified: Secondary | ICD-10-CM | POA: Diagnosis not present

## 2021-06-20 DIAGNOSIS — Z5181 Encounter for therapeutic drug level monitoring: Secondary | ICD-10-CM | POA: Diagnosis not present

## 2021-06-20 DIAGNOSIS — Z942 Lung transplant status: Secondary | ICD-10-CM | POA: Diagnosis not present

## 2021-06-21 DIAGNOSIS — I10 Essential (primary) hypertension: Secondary | ICD-10-CM | POA: Diagnosis not present

## 2021-06-21 DIAGNOSIS — I2510 Atherosclerotic heart disease of native coronary artery without angina pectoris: Secondary | ICD-10-CM | POA: Diagnosis not present

## 2021-06-21 DIAGNOSIS — M1711 Unilateral primary osteoarthritis, right knee: Secondary | ICD-10-CM | POA: Diagnosis not present

## 2021-06-21 DIAGNOSIS — Z96652 Presence of left artificial knee joint: Secondary | ICD-10-CM | POA: Diagnosis not present

## 2021-06-21 DIAGNOSIS — Z471 Aftercare following joint replacement surgery: Secondary | ICD-10-CM | POA: Diagnosis not present

## 2021-06-22 DIAGNOSIS — M7989 Other specified soft tissue disorders: Secondary | ICD-10-CM | POA: Diagnosis not present

## 2021-06-22 DIAGNOSIS — Z79899 Other long term (current) drug therapy: Secondary | ICD-10-CM | POA: Diagnosis not present

## 2021-06-22 DIAGNOSIS — I2510 Atherosclerotic heart disease of native coronary artery without angina pectoris: Secondary | ICD-10-CM | POA: Diagnosis not present

## 2021-06-22 DIAGNOSIS — K219 Gastro-esophageal reflux disease without esophagitis: Secondary | ICD-10-CM | POA: Diagnosis not present

## 2021-06-22 DIAGNOSIS — R2242 Localized swelling, mass and lump, left lower limb: Secondary | ICD-10-CM | POA: Diagnosis not present

## 2021-06-22 DIAGNOSIS — J84112 Idiopathic pulmonary fibrosis: Secondary | ICD-10-CM | POA: Diagnosis not present

## 2021-06-22 DIAGNOSIS — Z942 Lung transplant status: Secondary | ICD-10-CM | POA: Diagnosis not present

## 2021-06-22 DIAGNOSIS — I10 Essential (primary) hypertension: Secondary | ICD-10-CM | POA: Diagnosis not present

## 2021-06-22 DIAGNOSIS — E785 Hyperlipidemia, unspecified: Secondary | ICD-10-CM | POA: Diagnosis not present

## 2021-06-22 DIAGNOSIS — Z96652 Presence of left artificial knee joint: Secondary | ICD-10-CM | POA: Diagnosis not present

## 2021-06-22 DIAGNOSIS — Z7952 Long term (current) use of systemic steroids: Secondary | ICD-10-CM | POA: Diagnosis not present

## 2021-06-22 DIAGNOSIS — Z7982 Long term (current) use of aspirin: Secondary | ICD-10-CM | POA: Diagnosis not present

## 2021-06-22 DIAGNOSIS — Z888 Allergy status to other drugs, medicaments and biological substances status: Secondary | ICD-10-CM | POA: Diagnosis not present

## 2021-06-24 DIAGNOSIS — Z96652 Presence of left artificial knee joint: Secondary | ICD-10-CM | POA: Diagnosis not present

## 2021-06-24 DIAGNOSIS — Z471 Aftercare following joint replacement surgery: Secondary | ICD-10-CM | POA: Diagnosis not present

## 2021-06-24 DIAGNOSIS — M1711 Unilateral primary osteoarthritis, right knee: Secondary | ICD-10-CM | POA: Diagnosis not present

## 2021-06-24 DIAGNOSIS — I10 Essential (primary) hypertension: Secondary | ICD-10-CM | POA: Diagnosis not present

## 2021-06-24 DIAGNOSIS — I2510 Atherosclerotic heart disease of native coronary artery without angina pectoris: Secondary | ICD-10-CM | POA: Diagnosis not present

## 2021-06-28 DIAGNOSIS — I10 Essential (primary) hypertension: Secondary | ICD-10-CM | POA: Diagnosis not present

## 2021-06-28 DIAGNOSIS — M1711 Unilateral primary osteoarthritis, right knee: Secondary | ICD-10-CM | POA: Diagnosis not present

## 2021-06-28 DIAGNOSIS — I2510 Atherosclerotic heart disease of native coronary artery without angina pectoris: Secondary | ICD-10-CM | POA: Diagnosis not present

## 2021-06-28 DIAGNOSIS — Z471 Aftercare following joint replacement surgery: Secondary | ICD-10-CM | POA: Diagnosis not present

## 2021-06-28 DIAGNOSIS — Z96652 Presence of left artificial knee joint: Secondary | ICD-10-CM | POA: Diagnosis not present

## 2021-07-01 DIAGNOSIS — Z96652 Presence of left artificial knee joint: Secondary | ICD-10-CM | POA: Diagnosis not present

## 2021-07-01 DIAGNOSIS — I2510 Atherosclerotic heart disease of native coronary artery without angina pectoris: Secondary | ICD-10-CM | POA: Diagnosis not present

## 2021-07-01 DIAGNOSIS — I10 Essential (primary) hypertension: Secondary | ICD-10-CM | POA: Diagnosis not present

## 2021-07-01 DIAGNOSIS — M1711 Unilateral primary osteoarthritis, right knee: Secondary | ICD-10-CM | POA: Diagnosis not present

## 2021-07-01 DIAGNOSIS — Z471 Aftercare following joint replacement surgery: Secondary | ICD-10-CM | POA: Diagnosis not present

## 2021-07-02 DIAGNOSIS — T86818 Other complications of lung transplant: Secondary | ICD-10-CM | POA: Diagnosis not present

## 2021-07-02 DIAGNOSIS — Z79899 Other long term (current) drug therapy: Secondary | ICD-10-CM | POA: Diagnosis not present

## 2021-07-02 DIAGNOSIS — D849 Immunodeficiency, unspecified: Secondary | ICD-10-CM | POA: Diagnosis not present

## 2021-07-02 DIAGNOSIS — M1712 Unilateral primary osteoarthritis, left knee: Secondary | ICD-10-CM | POA: Diagnosis not present

## 2021-07-02 DIAGNOSIS — Z5181 Encounter for therapeutic drug level monitoring: Secondary | ICD-10-CM | POA: Diagnosis not present

## 2021-07-02 DIAGNOSIS — Z942 Lung transplant status: Secondary | ICD-10-CM | POA: Diagnosis not present

## 2021-07-03 DIAGNOSIS — I2510 Atherosclerotic heart disease of native coronary artery without angina pectoris: Secondary | ICD-10-CM | POA: Diagnosis not present

## 2021-07-03 DIAGNOSIS — Z471 Aftercare following joint replacement surgery: Secondary | ICD-10-CM | POA: Diagnosis not present

## 2021-07-03 DIAGNOSIS — I10 Essential (primary) hypertension: Secondary | ICD-10-CM | POA: Diagnosis not present

## 2021-07-03 DIAGNOSIS — Z96652 Presence of left artificial knee joint: Secondary | ICD-10-CM | POA: Diagnosis not present

## 2021-07-03 DIAGNOSIS — M1711 Unilateral primary osteoarthritis, right knee: Secondary | ICD-10-CM | POA: Diagnosis not present

## 2021-07-05 DIAGNOSIS — Z942 Lung transplant status: Secondary | ICD-10-CM | POA: Diagnosis not present

## 2021-07-05 DIAGNOSIS — D849 Immunodeficiency, unspecified: Secondary | ICD-10-CM | POA: Diagnosis not present

## 2021-07-05 DIAGNOSIS — D801 Nonfamilial hypogammaglobulinemia: Secondary | ICD-10-CM | POA: Diagnosis not present

## 2021-07-08 DIAGNOSIS — Z471 Aftercare following joint replacement surgery: Secondary | ICD-10-CM | POA: Diagnosis not present

## 2021-07-08 DIAGNOSIS — I2510 Atherosclerotic heart disease of native coronary artery without angina pectoris: Secondary | ICD-10-CM | POA: Diagnosis not present

## 2021-07-08 DIAGNOSIS — M1711 Unilateral primary osteoarthritis, right knee: Secondary | ICD-10-CM | POA: Diagnosis not present

## 2021-07-08 DIAGNOSIS — Z96652 Presence of left artificial knee joint: Secondary | ICD-10-CM | POA: Diagnosis not present

## 2021-07-08 DIAGNOSIS — I10 Essential (primary) hypertension: Secondary | ICD-10-CM | POA: Diagnosis not present

## 2021-07-11 DIAGNOSIS — Z85828 Personal history of other malignant neoplasm of skin: Secondary | ICD-10-CM | POA: Diagnosis not present

## 2021-07-11 DIAGNOSIS — L570 Actinic keratosis: Secondary | ICD-10-CM | POA: Diagnosis not present

## 2021-07-11 DIAGNOSIS — L821 Other seborrheic keratosis: Secondary | ICD-10-CM | POA: Diagnosis not present

## 2021-07-11 DIAGNOSIS — L579 Skin changes due to chronic exposure to nonionizing radiation, unspecified: Secondary | ICD-10-CM | POA: Diagnosis not present

## 2021-07-11 DIAGNOSIS — L905 Scar conditions and fibrosis of skin: Secondary | ICD-10-CM | POA: Diagnosis not present

## 2021-07-11 DIAGNOSIS — L814 Other melanin hyperpigmentation: Secondary | ICD-10-CM | POA: Diagnosis not present

## 2021-07-11 DIAGNOSIS — D225 Melanocytic nevi of trunk: Secondary | ICD-10-CM | POA: Diagnosis not present

## 2021-07-15 DIAGNOSIS — Z96652 Presence of left artificial knee joint: Secondary | ICD-10-CM | POA: Diagnosis not present

## 2021-07-15 DIAGNOSIS — I2510 Atherosclerotic heart disease of native coronary artery without angina pectoris: Secondary | ICD-10-CM | POA: Diagnosis not present

## 2021-07-15 DIAGNOSIS — I10 Essential (primary) hypertension: Secondary | ICD-10-CM | POA: Diagnosis not present

## 2021-07-15 DIAGNOSIS — M1711 Unilateral primary osteoarthritis, right knee: Secondary | ICD-10-CM | POA: Diagnosis not present

## 2021-07-15 DIAGNOSIS — Z471 Aftercare following joint replacement surgery: Secondary | ICD-10-CM | POA: Diagnosis not present

## 2021-07-17 DIAGNOSIS — D849 Immunodeficiency, unspecified: Secondary | ICD-10-CM | POA: Diagnosis not present

## 2021-07-17 DIAGNOSIS — Z942 Lung transplant status: Secondary | ICD-10-CM | POA: Diagnosis not present

## 2021-07-17 DIAGNOSIS — T86818 Other complications of lung transplant: Secondary | ICD-10-CM | POA: Diagnosis not present

## 2021-07-17 DIAGNOSIS — Z79899 Other long term (current) drug therapy: Secondary | ICD-10-CM | POA: Diagnosis not present

## 2021-07-17 DIAGNOSIS — Z5181 Encounter for therapeutic drug level monitoring: Secondary | ICD-10-CM | POA: Diagnosis not present

## 2021-07-19 DIAGNOSIS — Z96652 Presence of left artificial knee joint: Secondary | ICD-10-CM | POA: Diagnosis not present

## 2021-07-19 DIAGNOSIS — I2510 Atherosclerotic heart disease of native coronary artery without angina pectoris: Secondary | ICD-10-CM | POA: Diagnosis not present

## 2021-07-19 DIAGNOSIS — M1711 Unilateral primary osteoarthritis, right knee: Secondary | ICD-10-CM | POA: Diagnosis not present

## 2021-07-19 DIAGNOSIS — Z471 Aftercare following joint replacement surgery: Secondary | ICD-10-CM | POA: Diagnosis not present

## 2021-07-19 DIAGNOSIS — I10 Essential (primary) hypertension: Secondary | ICD-10-CM | POA: Diagnosis not present

## 2021-07-22 DIAGNOSIS — I10 Essential (primary) hypertension: Secondary | ICD-10-CM | POA: Diagnosis not present

## 2021-07-22 DIAGNOSIS — H02886 Meibomian gland dysfunction of left eye, unspecified eyelid: Secondary | ICD-10-CM | POA: Diagnosis not present

## 2021-07-22 DIAGNOSIS — I2510 Atherosclerotic heart disease of native coronary artery without angina pectoris: Secondary | ICD-10-CM | POA: Diagnosis not present

## 2021-07-22 DIAGNOSIS — Z471 Aftercare following joint replacement surgery: Secondary | ICD-10-CM | POA: Diagnosis not present

## 2021-07-22 DIAGNOSIS — H02883 Meibomian gland dysfunction of right eye, unspecified eyelid: Secondary | ICD-10-CM | POA: Diagnosis not present

## 2021-07-22 DIAGNOSIS — Z96652 Presence of left artificial knee joint: Secondary | ICD-10-CM | POA: Diagnosis not present

## 2021-07-22 DIAGNOSIS — M1711 Unilateral primary osteoarthritis, right knee: Secondary | ICD-10-CM | POA: Diagnosis not present

## 2021-07-22 DIAGNOSIS — H04123 Dry eye syndrome of bilateral lacrimal glands: Secondary | ICD-10-CM | POA: Diagnosis not present

## 2021-07-26 DIAGNOSIS — I10 Essential (primary) hypertension: Secondary | ICD-10-CM | POA: Diagnosis not present

## 2021-07-26 DIAGNOSIS — Z96652 Presence of left artificial knee joint: Secondary | ICD-10-CM | POA: Diagnosis not present

## 2021-07-26 DIAGNOSIS — M1711 Unilateral primary osteoarthritis, right knee: Secondary | ICD-10-CM | POA: Diagnosis not present

## 2021-07-26 DIAGNOSIS — I2510 Atherosclerotic heart disease of native coronary artery without angina pectoris: Secondary | ICD-10-CM | POA: Diagnosis not present

## 2021-07-26 DIAGNOSIS — Z471 Aftercare following joint replacement surgery: Secondary | ICD-10-CM | POA: Diagnosis not present

## 2021-07-29 DIAGNOSIS — Z96652 Presence of left artificial knee joint: Secondary | ICD-10-CM | POA: Diagnosis not present

## 2021-07-29 DIAGNOSIS — Z471 Aftercare following joint replacement surgery: Secondary | ICD-10-CM | POA: Diagnosis not present

## 2021-07-29 DIAGNOSIS — M1711 Unilateral primary osteoarthritis, right knee: Secondary | ICD-10-CM | POA: Diagnosis not present

## 2021-07-29 DIAGNOSIS — I2510 Atherosclerotic heart disease of native coronary artery without angina pectoris: Secondary | ICD-10-CM | POA: Diagnosis not present

## 2021-07-29 DIAGNOSIS — I10 Essential (primary) hypertension: Secondary | ICD-10-CM | POA: Diagnosis not present

## 2021-08-01 ENCOUNTER — Ambulatory Visit (INDEPENDENT_AMBULATORY_CARE_PROVIDER_SITE_OTHER): Payer: Medicare Other

## 2021-08-01 VITALS — Ht 68.0 in | Wt 165.0 lb

## 2021-08-01 DIAGNOSIS — Z0000 Encounter for general adult medical examination without abnormal findings: Secondary | ICD-10-CM | POA: Diagnosis not present

## 2021-08-01 NOTE — Progress Notes (Signed)
Subjective:   Vernon Richard is a 70 y.o. male who presents for Medicare Annual/Subsequent preventive examination.  Virtual Visit via Telephone Note  I connected with  Vernon Richard on 08/01/21 at  3:30 PM EDT by telephone and verified that I am speaking with the correct person using two identifiers.  Location: Patient: Home Provider: [facility] Persons participating in the virtual visit: patient/Nurse Health Advisor   I discussed the limitations, risks, security and privacy concerns of performing an evaluation and management service by telephone and the availability of in person appointments. The patient expressed understanding and agreed to proceed.  Interactive audio and video telecommunications were attempted between this nurse and patient, however failed, due to patient having technical difficulties OR patient did not have access to video capability.  We continued and completed visit with audio only.  Some vital signs may be absent or patient reported.   Vernon Richard E Jalon Blackwelder, LPN   Review of Systems     Cardiac Risk Factors include: advanced age (>59men, >106 women);male gender;hypertension;dyslipidemia;Other (see comment), Risk factor comments: hx of lung transplant, CAD, A.Fib     Objective:    Today's Vitals   08/01/21 1529  Weight: 165 lb (74.8 kg)  Height: 5\' 8"  (1.727 m)   Body mass index is 25.09 kg/m.  Advanced Directives 08/01/2021 03/26/2021 05/27/2018  Does Patient Have a Medical Advance Directive? Yes Yes Yes  Type of Paramedic of [place];Living will - [place];Living will  Copy of [place] in Chart? No - copy requested - No - copy requested    Current Medications (verified) Outpatient Encounter Medications as of 08/01/2021  Medication Sig   acetaminophen (TYLENOL) 500 MG tablet Take by mouth.   amLODipine (NORVASC) 5 MG tablet Take 1 tablet (5 mg total) by mouth daily.   aspirin 81 MG tablet Take 81 mg by  mouth daily.   Calcium Citrate-Vitamin D 200-250 MG-UNIT TABS Take by mouth.   cetirizine (ZYRTEC) 10 MG tablet Take 10 mg by mouth daily.   erythromycin ophthalmic ointment at bedtime.   gabapentin (NEURONTIN) 300 MG capsule TAKE 1 CAPS AT NIGHT X 3 DAYS, 1 TWICE DAILY X 3 DAYS, THEN 1 CAPSULE IN AM, 1 AT NOON, AND 1 AT BED   MAGNESIUM GLYCINATE PO Take 800 mg by mouth 2 (two) times daily.   Multiple Vitamin (MULTI-VITAMIN) tablet Take by mouth.   Mycophenolate Mofetil (CELLCEPT PO) Take by mouth.   pantoprazole (PROTONIX) 40 MG tablet Take 1 tablet (40 mg total) by mouth daily.   polyethylene glycol powder (GLYCOLAX/MIRALAX) 17 GM/SCOOP powder Take by mouth.   pravastatin (PRAVACHOL) 40 MG tablet Take 1 tablet (40 mg total) by mouth daily.   predniSONE (DELTASONE) 5 MG tablet PLEASE SEE ATTACHED FOR DETAILED DIRECTIONS   [DISCONTINUED] acetaminophen (TYLENOL) 500 MG tablet Take by mouth.   [DISCONTINUED] amLODipine (NORVASC) 5 MG tablet Take by mouth.   [DISCONTINUED] cetirizine (ZYRTEC) 10 MG tablet Take by mouth.   [DISCONTINUED] cycloSPORINE (RESTASIS) 0.05 % ophthalmic emulsion Apply to eye.   [DISCONTINUED] fluticasone (FLONASE) 50 MCG/ACT nasal spray Place into the nose.   cycloSPORINE (RESTASIS) 0.05 % ophthalmic emulsion Place 1 drop into both eyes 2 (two) times daily. (Patient not taking: No sig reported)   DULoxetine (CYMBALTA) 60 MG capsule Take 1 capsule (60 mg total) by mouth daily. With supper. For Arthritis (Patient not taking: No sig reported)   fluticasone (FLONASE) 50 MCG/ACT nasal spray Place 2  sprays into both nostrils daily. (Patient not taking: No sig reported)   montelukast (SINGULAIR) 10 MG tablet Take 1 tablet (10 mg total) by mouth at bedtime. (Patient not taking: No sig reported)   risedronate (ACTONEL) 150 MG tablet Take 1 tablet (150 mg total) by mouth every 30 (thirty) days. with water, on an empty stomach, take nothing by mouth and to not lie down for 30 minutes  after each dose. (Patient not taking: No sig reported)   sulfamethoxazole-trimethoprim (BACTRIM) 400-80 MG tablet  (Patient not taking: Reported on 08/01/2021)   Tacrolimus ER 1 MG TB24 08/23/19 Take three- 1 mg tabs daily (total of 3 mg). (Patient not taking: No sig reported)   No facility-administered encounter medications on file as of 08/01/2021.    Allergies (verified) Nsaids   History: Past Medical History:  Diagnosis Date   Arthritis    OA of both knees   Heart murmur, systolic    Hyperlipidemia    Hypertension    Pulmonary fibrosis ([place]) 05/03/2018   Past Surgical History:  Procedure Laterality Date   CATARACT EXTRACTION, BILATERAL     HERNIA REPAIR     groin   LUNG TRANSPLANT, SINGLE Right 09/15/2018   Family History  Problem Relation Age of Onset   Cancer Mother    Cancer Father    Social History   Socioeconomic History   Marital status: Married    Spouse name: Not on file   Number of children: 1   Years of education: Not on file   Highest education level: 12th grade  Occupational History   Occupation: Farm  Tobacco Use   Smoking status: Never   Smokeless tobacco: Never  Vaping Use   Vaping Use: Never used  Substance and Sexual Activity   Alcohol use: No   Drug use: No   Sexual activity: Not Currently  Other Topics Concern   Not on file  Social History Narrative   Married 1 child   S/p lung transplant   Social Determinants of Health   Financial Resource Strain: Low Risk    Difficulty of Paying Living Expenses: Not hard at all  Food Insecurity: No Food Insecurity   Worried About Charity fundraiser in the Last Year: Never true   Arboriculturist in the Last Year: Never true  Transportation Needs: No Transportation Needs   Lack of Transportation (Medical): No   Lack of Transportation (Non-Medical): No  Physical Activity: Sufficiently Active   Days of Exercise per Week: 5 days   Minutes of Exercise per Session: 30 min  Stress: No Stress  Concern Present   Feeling of Stress : Not at all  Social Connections: Moderately Integrated   Frequency of Communication with Friends and Family: More than three times a week   Frequency of Social Gatherings with Friends and Family: More than three times a week   Attends Religious Services: More than 4 times per year   Active Member of [employer] or Organizations: No   Attends Music therapist: Never   Marital Status: Married    Tobacco Counseling Counseling given: Not Answered   Clinical Intake:  Pre-visit preparation completed: Yes  Pain : No/denies pain     BMI - recorded: 25.09 Nutritional Status: BMI 25 -29 Overweight Nutritional Risks: None Diabetes: No  How often do you need to have someone help you when you read instructions, pamphlets, or other written materials from your doctor or pharmacy?: 1 - Never  Diabetic? No  Interpreter Needed?: No  Information entered by :: Aleczander Fandino, LPN   Activities of Daily Living In your present state of health, do you have any difficulty performing the following activities: 08/01/2021  Hearing? N  Vision? N  Difficulty concentrating or making decisions? N  Walking or climbing stairs? N  Dressing or bathing? N  Doing errands, shopping? N  Preparing Food and eating ? N  Using the Toilet? N  In the past six months, have you accidently leaked urine? N  Do you have problems with loss of bowel control? N  Managing your Medications? N  Managing your Finances? N  Housekeeping or managing your Housekeeping? N  Some recent data might be hidden    Patient Care Team: Claretta Fraise, MD as PCP - General (Family Medicine)  Indicate any recent Medical Services you may have received from other than [facility] providers in the past year (date may be approximate).     Assessment:   This is a routine wellness examination for [place].  Hearing/Vision screen Hearing Screening - Comments:: Denies hearing difficulties  Vision  Screening - Comments:: Denies vision difficulties - has dry eyes - up to date with annual eye exams with [facility] at [place] issues and exercise activities discussed: Current Exercise Habits: Home exercise routine, Type of exercise: walking;strength training/weights;stretching, Time (Minutes): 30, Frequency (Times/Week): 7, Weekly Exercise (Minutes/Week): 210, Intensity: Moderate, Exercise limited by: orthopedic condition(s)   Goals Addressed             This Visit's Progress    DIET - EAT MORE FRUITS AND VEGETABLES   On track      Depression Screen PHQ 2/9 Scores 08/01/2021 07/06/2020 10/11/2019 05/27/2018 05/27/2018 05/06/2018 12/10/2017  PHQ - 2 Score 0 0 0 0 0 0 0    Fall Risk Fall Risk  08/01/2021 07/06/2020 10/11/2019 05/27/2018 05/27/2018  Falls in the past year? 0 0 0 No No  Number falls in past yr: 0 - - - -  Injury with Fall? 0 - - - -  Risk for fall due to : Orthopedic patient - - - -  Follow up Falls prevention discussed Falls evaluation completed - - -    FALL RISK PREVENTION PERTAINING TO THE HOME:  Any stairs in or around the home? Yes  If so, are there any without handrails? No  Home free of loose throw rugs in walkways, pet beds, electrical cords, etc? Yes  Adequate lighting in your home to reduce risk of falls? Yes   ASSISTIVE DEVICES UTILIZED TO PREVENT FALLS:  Life alert? No  Use of a cane, walker or w/c? No  Grab bars in the bathroom? Yes  Shower chair or bench in shower? Yes  Elevated toilet seat or a handicapped toilet? Yes   TIMED UP AND GO:  Was the test performed? No . Telephonic visit  Cognitive Function: Normal cognitive status assessed by direct observation by this Nurse Health Advisor. No abnormalities found.   MMSE - Mini Mental State Exam 05/27/2018  Orientation to time 5  Orientation to Place 5  Registration 3  Attention/ Calculation 5  Recall 3  Language- name 2 objects 2  Language- repeat 1  Language- follow 3 step command 3   Language- read & follow direction 1  Write a sentence 1  Copy design 1  Total score 30     6CIT Screen 08/01/2021  What Year? 0 points  What month? 0 points  What time? 0 points  Count  back from 20 0 points  Months in reverse 0 points  Repeat phrase 0 points  Total Score 0    Immunizations Immunization History  Administered Date(s) Administered   Hepatitis B, adult 05/06/2018, 06/03/2018, 08/17/2018   Influenza, High Dose Seasonal PF 09/21/2017, 08/03/2018, 07/21/2019   Influenza, Seasonal, Injecte, Preservative Fre 07/19/2013, 09/13/2014, 09/18/2015, 09/17/2016   Influenza,inj,Quad PF,6+ Mos 07/19/2013, 09/13/2014, 09/18/2015, 09/17/2016   Moderna Sars-Covid-2 Vaccination 11/12/2019, 03/27/2020, 07/12/2020   Pneumococcal Conjugate-13 03/17/2017   Pneumococcal Polysaccharide-23 05/06/2018   Tdap 07/26/2012   Zoster Recombinat (Shingrix) 06/03/2018    TDAP status: Up to date  Flu Vaccine status: Due, Education has been provided regarding the importance of this vaccine. Advised may receive this vaccine at local pharmacy or Health Dept. Aware to provide a copy of the vaccination record if obtained from local pharmacy or Health Dept. Verbalized acceptance and understanding.  Pneumococcal vaccine status: Up to date  Covid-19 vaccine status: Completed vaccines  Qualifies for Shingles Vaccine? Yes   Zostavax completed Yes   Shingrix Completed?: No.    Education has been provided regarding the importance of this vaccine. Patient has been advised to call insurance company to determine out of pocket expense if they have not yet received this vaccine. Advised may also receive vaccine at local pharmacy or Health Dept. Verbalized acceptance and understanding.  Screening Tests Health Maintenance  Topic Date Due   Zoster Vaccines- Shingrix (2 of 2) 07/29/2018   COVID-19 Vaccine (4 - Booster for Moderna series) 10/04/2020   INFLUENZA VACCINE  05/20/2021   TETANUS/TDAP  07/26/2022    COLONOSCOPY (Pts 45-59yrs Insurance coverage will need to be confirmed)  10/04/2023   Hepatitis C Screening  Completed   HPV VACCINES  Aged Out    Health Maintenance  Health Maintenance Due  Topic Date Due   Zoster Vaccines- Shingrix (2 of 2) 07/29/2018   COVID-19 Vaccine (4 - Booster for Moderna series) 10/04/2020   INFLUENZA VACCINE  05/20/2021    Colorectal cancer screening: Type of screening: Colonoscopy. Completed 2019. Repeat every 5 years  Lung Cancer Screening: (Low Dose CT Chest recommended if Age 27-80 years, 30 pack-year currently smoking OR have quit w/in 15years.) does not qualify.   Additional Screening:  Hepatitis C Screening: does qualify; Completed 03/07/2017  Vision Screening: Recommended annual ophthalmology exams for early detection of glaucoma and other disorders of the eye. Is the patient up to date with their annual eye exam?  Yes  Who is the provider or what is the name of the office in which the patient attends annual eye exams? [facility] If pt is not established with a provider, would they like to be referred to a provider to establish care? No .   Dental Screening: Recommended annual dental exams for proper oral hygiene  Community Resource Referral / Chronic Care Management: CRR required this visit?  No   CCM required this visit?  No      Plan:     I have personally reviewed and noted the following in the patient's chart:   Medical and social history Use of alcohol, tobacco or illicit drugs  Current medications and supplements including opioid prescriptions. Patient is not currently taking opioid prescriptions. Functional ability and status Nutritional status Physical activity Advanced directives List of other physicians Hospitalizations, surgeries, and ER visits in previous 12 months Vitals Screenings to include cognitive, depression, and falls Referrals and appointments  In addition, I have reviewed and discussed with patient certain  preventive protocols, quality  metrics, and best practice recommendations. A written personalized care plan for preventive services as well as general preventive health recommendations were provided to patient.     Sandrea Hammond, LPN   [id number]   Nurse Notes: None

## 2021-08-01 NOTE — Patient Instructions (Signed)
Vernon Richard , Thank you for taking time to come for your Medicare Wellness Visit. I appreciate your ongoing commitment to your health goals. Please review the following plan we discussed and let me know if I can assist you in the future.   Screening recommendations/referrals: Colonoscopy: [facility] 2019 - Repeat in 5 years  Recommended yearly ophthalmology/optometry visit for glaucoma screening and checkup Recommended yearly dental visit for hygiene and checkup  Vaccinations: Influenza vaccine: Due. Every fall Pneumococcal vaccine: Done 03/17/2017 & 05/06/2018 Tdap vaccine: done 07/26/2012 - Repeat in 10 years Shingles vaccine: First dose done 06/03/2018 - due for second dose   Covid-19:  Done 11/12/2019, 03/27/2020, & 07/12/2020  Advanced directives: Please bring a copy of your health care power of attorney and living will to the office to be added to your chart at your convenience.   Conditions/risks identified: Aim for 30 minutes of exercise or brisk walking each day, drink 6-8 glasses of water and eat lots of fruits and vegetables.   Next appointment: Follow up in one year for your annual wellness visit.   Preventive Care 5 Years and Older, Male  Preventive care refers to lifestyle choices and visits with your health care provider that can promote health and wellness. What does preventive care include? A yearly physical exam. This is also called an annual well check. Dental exams once or twice a year. Routine eye exams. Ask your health care provider how often you should have your eyes checked. Personal lifestyle choices, including: Daily care of your teeth and gums. Regular physical activity. Eating a healthy diet. Avoiding tobacco and drug use. Limiting alcohol use. Practicing safe sex. Taking low doses of aspirin every day. Taking vitamin and mineral supplements as recommended by your health care provider. What happens during an annual well check? The services and screenings  done by your health care provider during your annual well check will depend on your age, overall health, lifestyle risk factors, and family history of disease. Counseling  Your health care provider may ask you questions about your: Alcohol use. Tobacco use. Drug use. Emotional well-being. Home and relationship well-being. Sexual activity. Eating habits. History of falls. Memory and ability to understand (cognition). Work and work Statistician. Screening  You may have the following tests or measurements: Height, weight, and BMI. Blood pressure. Lipid and cholesterol levels. These may be checked every 5 years, or more frequently if you are over 13 years old. Skin check. Lung cancer screening. You may have this screening every year starting at age 75 if you have a 30-pack-year history of smoking and currently smoke or have quit within the past 15 years. Fecal occult blood test (FOBT) of the stool. You may have this test every year starting at age 21. Flexible sigmoidoscopy or colonoscopy. You may have a sigmoidoscopy every 5 years or a colonoscopy every 10 years starting at age 94. Prostate cancer screening. Recommendations will vary depending on your family history and other risks. Hepatitis C blood test. Hepatitis B blood test. Sexually transmitted disease (STD) testing. Diabetes screening. This is done by checking your blood sugar (glucose) after you have not eaten for a while (fasting). You may have this done every 1-3 years. Abdominal aortic aneurysm (AAA) screening. You may need this if you are a current or former smoker. Osteoporosis. You may be screened starting at age 33 if you are at high risk. Talk with your health care provider about your test results, treatment options, and if necessary, the need  for more tests. Vaccines  Your health care provider may recommend certain vaccines, such as: Influenza vaccine. This is recommended every year. Tetanus, diphtheria, and acellular  pertussis (Tdap, Td) vaccine. You may need a Td booster every 10 years. Zoster vaccine. You may need this after age 20. Pneumococcal 13-valent conjugate (PCV13) vaccine. One dose is recommended after age 59. Pneumococcal polysaccharide (PPSV23) vaccine. One dose is recommended after age 53. Talk to your health care provider about which screenings and vaccines you need and how often you need them. This information is not intended to replace advice given to you by your health care provider. Make sure you discuss any questions you have with your health care provider. Document Released: 11/02/2015 Document Revised: 06/25/2016 Document Reviewed: 08/07/2015 Elsevier Interactive Patient Education  2017 [place] Prevention in the Home Falls can cause injuries. They can happen to people of all ages. There are many things you can do to make your home safe and to help prevent falls. What can I do on the outside of my home? Regularly fix the edges of walkways and driveways and fix any cracks. Remove anything that might make you trip as you walk through a door, such as a raised step or threshold. Trim any bushes or trees on the path to your home. Use bright outdoor lighting. Clear any walking paths of anything that might make someone trip, such as rocks or tools. Regularly check to see if handrails are loose or broken. Make sure that both sides of any steps have handrails. Any raised decks and porches should have guardrails on the edges. Have any leaves, snow, or ice cleared regularly. Use sand or salt on walking paths during winter. Clean up any spills in your garage right away. This includes oil or grease spills. What can I do in the bathroom? Use night lights. Install grab bars by the toilet and in the tub and shower. Do not use towel bars as grab bars. Use non-skid mats or decals in the tub or shower. If you need to sit down in the shower, use a plastic, non-slip stool. Keep the floor  dry. Clean up any water that spills on the floor as soon as it happens. Remove soap buildup in the tub or shower regularly. Attach bath mats securely with double-sided non-slip rug tape. Do not have throw rugs and other things on the floor that can make you trip. What can I do in the bedroom? Use night lights. Make sure that you have a light by your bed that is easy to reach. Do not use any sheets or blankets that are too big for your bed. They should not hang down onto the floor. Have a firm chair that has side arms. You can use this for support while you get dressed. Do not have throw rugs and other things on the floor that can make you trip. What can I do in the kitchen? Clean up any spills right away. Avoid walking on wet floors. Keep items that you use a lot in easy-to-reach places. If you need to reach something above you, use a strong step stool that has a grab bar. Keep electrical cords out of the way. Do not use floor polish or wax that makes floors slippery. If you must use wax, use non-skid floor wax. Do not have throw rugs and other things on the floor that can make you trip. What can I do with my stairs? Do not leave any items on the stairs.  Make sure that there are handrails on both sides of the stairs and use them. Fix handrails that are broken or loose. Make sure that handrails are as long as the stairways. Check any carpeting to make sure that it is firmly attached to the stairs. Fix any carpet that is loose or worn. Avoid having throw rugs at the top or bottom of the stairs. If you do have throw rugs, attach them to the floor with carpet tape. Make sure that you have a light switch at the top of the stairs and the bottom of the stairs. If you do not have them, ask someone to add them for you. What else can I do to help prevent falls? Wear shoes that: Do not have high heels. Have rubber bottoms. Are comfortable and fit you well. Are closed at the toe. Do not wear  sandals. If you use a stepladder: Make sure that it is fully opened. Do not climb a closed stepladder. Make sure that both sides of the stepladder are locked into place. Ask someone to hold it for you, if possible. Clearly mark and make sure that you can see: Any grab bars or handrails. First and last steps. Where the edge of each step is. Use tools that help you move around (mobility aids) if they are needed. These include: Canes. Walkers. Scooters. Crutches. Turn on the lights when you go into a dark area. Replace any light bulbs as soon as they burn out. Set up your furniture so you have a clear path. Avoid moving your furniture around. If any of your floors are uneven, fix them. If there are any pets around you, be aware of where they are. Review your medicines with your doctor. Some medicines can make you feel dizzy. This can increase your chance of falling. Ask your doctor what other things that you can do to help prevent falls. This information is not intended to replace advice given to you by your health care provider. Make sure you discuss any questions you have with your health care provider. Document Released: 08/02/2009 Document Revised: 03/13/2016 Document Reviewed: 11/10/2014 Elsevier Interactive Patient Education  2017 [employer].

## 2021-08-05 DIAGNOSIS — D849 Immunodeficiency, unspecified: Secondary | ICD-10-CM | POA: Diagnosis not present

## 2021-08-05 DIAGNOSIS — T86818 Other complications of lung transplant: Secondary | ICD-10-CM | POA: Diagnosis not present

## 2021-08-05 DIAGNOSIS — Z79899 Other long term (current) drug therapy: Secondary | ICD-10-CM | POA: Diagnosis not present

## 2021-08-05 DIAGNOSIS — Z942 Lung transplant status: Secondary | ICD-10-CM | POA: Diagnosis not present

## 2021-08-05 DIAGNOSIS — Z5181 Encounter for therapeutic drug level monitoring: Secondary | ICD-10-CM | POA: Diagnosis not present

## 2021-08-06 DIAGNOSIS — I2510 Atherosclerotic heart disease of native coronary artery without angina pectoris: Secondary | ICD-10-CM | POA: Diagnosis not present

## 2021-08-06 DIAGNOSIS — Z96652 Presence of left artificial knee joint: Secondary | ICD-10-CM | POA: Diagnosis not present

## 2021-08-06 DIAGNOSIS — M1711 Unilateral primary osteoarthritis, right knee: Secondary | ICD-10-CM | POA: Diagnosis not present

## 2021-08-06 DIAGNOSIS — I10 Essential (primary) hypertension: Secondary | ICD-10-CM | POA: Diagnosis not present

## 2021-08-06 DIAGNOSIS — Z471 Aftercare following joint replacement surgery: Secondary | ICD-10-CM | POA: Diagnosis not present

## 2021-08-07 ENCOUNTER — Encounter: Payer: Self-pay | Admitting: Family Medicine

## 2021-08-07 ENCOUNTER — Other Ambulatory Visit: Payer: Self-pay

## 2021-08-07 ENCOUNTER — Ambulatory Visit (INDEPENDENT_AMBULATORY_CARE_PROVIDER_SITE_OTHER): Payer: Medicare Other | Admitting: Family Medicine

## 2021-08-07 VITALS — BP 116/66 | HR 80 | Temp 97.4°F | Ht 68.0 in | Wt 169.4 lb

## 2021-08-07 DIAGNOSIS — I10 Essential (primary) hypertension: Secondary | ICD-10-CM | POA: Diagnosis not present

## 2021-08-07 DIAGNOSIS — E785 Hyperlipidemia, unspecified: Secondary | ICD-10-CM

## 2021-08-07 DIAGNOSIS — Z7952 Long term (current) use of systemic steroids: Secondary | ICD-10-CM

## 2021-08-07 DIAGNOSIS — Z23 Encounter for immunization: Secondary | ICD-10-CM

## 2021-08-07 DIAGNOSIS — Z942 Lung transplant status: Secondary | ICD-10-CM

## 2021-08-07 DIAGNOSIS — M1990 Unspecified osteoarthritis, unspecified site: Secondary | ICD-10-CM | POA: Diagnosis not present

## 2021-08-07 DIAGNOSIS — Z125 Encounter for screening for malignant neoplasm of prostate: Secondary | ICD-10-CM

## 2021-08-07 DIAGNOSIS — E559 Vitamin D deficiency, unspecified: Secondary | ICD-10-CM | POA: Diagnosis not present

## 2021-08-07 LAB — URINALYSIS
Bilirubin, UA: NEGATIVE
Glucose, UA: NEGATIVE
Ketones, UA: NEGATIVE
Leukocytes,UA: NEGATIVE
Nitrite, UA: NEGATIVE
Protein,UA: NEGATIVE
RBC, UA: NEGATIVE
Specific Gravity, UA: 1.015 (ref 1.005–1.030)
Urobilinogen, Ur: 0.2 mg/dL (ref 0.2–1.0)
pH, UA: 7 (ref 5.0–7.5)

## 2021-08-07 MED ORDER — PRAVASTATIN SODIUM 40 MG PO TABS: 40.0000 mg | ORAL_TABLET | Freq: Every day | ORAL | 3 refills | Status: DC

## 2021-08-07 MED ORDER — MONTELUKAST SODIUM 10 MG PO TABS: 10.0000 mg | ORAL_TABLET | Freq: Every day | ORAL | 3 refills | Status: DC

## 2021-08-07 MED ORDER — AMLODIPINE BESYLATE 5 MG PO TABS: 5.0000 mg | ORAL_TABLET | Freq: Every day | ORAL | 3 refills | Status: DC

## 2021-08-07 MED ORDER — PANTOPRAZOLE SODIUM 40 MG PO TBEC: 40.0000 mg | DELAYED_RELEASE_TABLET | Freq: Every day | ORAL | 3 refills | Status: DC

## 2021-08-07 NOTE — Progress Notes (Signed)
Subjective:  Patient ID: Vernon Richard, male    DOB: Jul 11, 1951  Age: 70 y.o. MRN: [id number]  CC: Follow-up   HPI Vernon Richard presents for follow-up of elevated cholesterol. Doing well without complaints on current medication. Denies side effects of statin including myalgia and arthralgia and nausea. Also in today for liver function testing. Currently no chest pain, shortness of breath or other cardiovascular related symptoms noted.   presents for  follow-up of hypertension. Patient has no history of headache chest pain or shortness of breath or recent cough. Patient also denies symptoms of TIA such as focal numbness or weakness. Patient denies side effects from medication. States taking it regularly.   Two knee replacements this year. May 4, August 19. Dr. Ennis Forts did both.   Couldn't take duloxetine. It made him worse. Also couldn't tolerate the actonel.   Frequent skin lesions   Lung transplant pt.     History Vernon Richard has a past medical history of Arthritis, Heart murmur, systolic, Hyperlipidemia, Hypertension, and Pulmonary fibrosis ([place]) (05/03/2018).   He has a past surgical history that includes Hernia repair; Cataract extraction, bilateral; Lung transplant, single (Right, 09/15/2018); and Joint replacement (Bilateral).   His family history includes Cancer in his father and mother.He reports that he has never smoked. He has never used smokeless tobacco. He reports that he does not drink alcohol and does not use drugs.  Current Outpatient Medications on File Prior to Visit  Medication Sig Dispense Refill   acetaminophen (TYLENOL) 500 MG tablet Take by mouth.     aspirin 81 MG tablet Take 81 mg by mouth daily.     Calcium Citrate-Vitamin D 200-250 MG-UNIT TABS Take by mouth.     cetirizine (ZYRTEC) 10 MG tablet Take 10 mg by mouth daily.  2   cycloSPORINE (RESTASIS) 0.05 % ophthalmic emulsion Place 1 drop into both eyes 2 (two) times daily. 5.5 mL 11   erythromycin ophthalmic  ointment at bedtime.     fluticasone (FLONASE) 50 MCG/ACT nasal spray Place 2 sprays into both nostrils daily. 48 mL 3   gabapentin (NEURONTIN) 300 MG capsule TAKE 1 CAPS AT NIGHT X 3 DAYS, 1 TWICE DAILY X 3 DAYS, THEN 1 CAPSULE IN AM, 1 AT NOON, AND 1 AT BED     MAGNESIUM GLYCINATE PO Take 800 mg by mouth 2 (two) times daily.     Multiple Vitamin (MULTI-VITAMIN) tablet Take by mouth.     Mycophenolate Mofetil (CELLCEPT PO) Take by mouth.     polyethylene glycol powder (GLYCOLAX/MIRALAX) 17 GM/SCOOP powder Take by mouth.     risedronate (ACTONEL) 150 MG tablet Take 1 tablet (150 mg total) by mouth every 30 (thirty) days. with water, on an empty stomach, take nothing by mouth and to not lie down for 30 minutes after each dose. 3 tablet 3   Tacrolimus ER 1 MG TB24 08/23/19 Take three- 1 mg tabs daily (total of 3 mg).     No current facility-administered medications on file prior to visit.    ROS Review of Systems  Objective:  BP 116/66   Pulse 80   Temp (!) 97.4 F (36.3 C)   Ht '5\' 8"'  (1.727 m)   Wt 169 lb 6.4 oz (76.8 kg)   SpO2 98%   BMI 25.76 kg/m   BP Readings from Last 3 Encounters:  08/07/21 116/66  07/06/20 (!) 148/68  10/11/19 110/69    Wt Readings from Last 3 Encounters:  08/07/21 169 lb 6.4  oz (76.8 kg)  08/01/21 165 lb (74.8 kg)  07/06/20 165 lb (74.8 kg)     Physical Exam  No results found for: HGBA1C  Lab Results  Component Value Date   WBC 5.3 07/06/2020   HGB 12.6 (L) 07/06/2020   HCT 37.3 (L) 07/06/2020   PLT 244 07/06/2020   GLUCOSE 93 07/06/2020   CHOL 224 (H) 07/06/2020   TRIG 223 (H) 07/06/2020   HDL 58 07/06/2020   LDLCALC 127 (H) 07/06/2020   ALT 10 07/06/2020   AST 18 07/06/2020   NA 139 07/06/2020   K 4.3 07/06/2020   CL 101 07/06/2020   CREATININE 1.50 (H) 07/06/2020   BUN 14 07/06/2020   CO2 24 07/06/2020   PSA 0.2 09/13/2014    CT Chest Wo Contrast  Result Date: 10/06/2017 CLINICAL DATA:  70 year old male with history of  persistent cough for the past 4-5 months. Abnormal chest x-ray. Evaluate for pneumonia. EXAM: CT CHEST WITHOUT CONTRAST TECHNIQUE: Multidetector CT imaging of the chest was performed following the standard protocol without IV contrast. COMPARISON:  Chest CT 12/11/2016. FINDINGS: Comment:  Study is limited by extensive patient respiratory motion. Cardiovascular: Heart size is normal. There is no significant pericardial fluid, thickening or pericardial calcification. There is aortic atherosclerosis, as well as atherosclerosis of the great vessels of the mediastinum and the coronary arteries, including calcified atherosclerotic plaque in the left main, left anterior descending and right coronary arteries. Calcifications of the aortic valve. Mediastinum/Nodes: No pathologically enlarged mediastinal or hilar lymph nodes. Please note that accurate exclusion of hilar adenopathy is limited on noncontrast CT scans. Esophagus is unremarkable in appearance. No axillary lymphadenopathy. Lungs/Pleura: Study is severely limited by extensive patient respiratory motion. With these limitations in mind there are patchy areas of ground-glass attenuation, septal thickening, thickening of the peribronchovascular interstitium, regional areas of architectural distortion and traction bronchiectasis scattered randomly throughout the lungs bilaterally (left greater than right). In the left lung there is no definite craniocaudal gradient. No frank honeycombing is confidently identified at this time, although there has been clear progression of disease compared to the prior study. No definite acute confluent consolidative airspace disease. No pleural effusions. Upper Abdomen: Multiple low-attenuation lesions are again noted in the visualized portions of the liver, similar in size, number and distribution to the prior study, but incompletely characterized on today's noncontrast CT examination. Statistically, these are likely to represent cysts.  The largest of these lesions measures 2.9 x 3.2 cm in segment 2. Aortic atherosclerosis. Musculoskeletal: There are no aggressive appearing lytic or blastic lesions noted in the visualized portions of the skeleton. IMPRESSION: 1. No findings to suggest pneumonia. 2. However, there is clear evidence of progressive interstitial lung disease. Today's study was limited by considerable patient respiratory motion, and was not acquired as a high-resolution chest CT protocol. Findings are again favored to reflect chronic hypersensitivity pneumonitis, however, underlying usual interstitial pneumonia (UIP) is not entirely excluded. Outpatient referral to Pulmonology for further evaluation is strongly recommended, and repeat high-resolution chest CT in the next 6-12 months is recommended to assess for further parenchymal changes in the lung parenchyma. 3. Aortic atherosclerosis, in addition to left main and 2 vessel coronary artery disease. Please note that although the presence of coronary artery calcium documents the presence of coronary artery disease, the severity of this disease and any potential stenosis cannot be assessed on this non-gated CT examination. Assessment for potential risk factor modification, dietary therapy or pharmacologic therapy may be warranted, if clinically  indicated. Aortic Atherosclerosis (ICD10-I70.0). Electronically Signed   By: Vinnie Langton M.D.   On: 10/06/2017 11:04    Assessment & Plan:   Quoc was seen today for follow-up.  Diagnoses and all orders for this visit:  Status post lung transplantation ([place]) -     DG Bone Density; Future -     CBC with Differential/Platelet -     CMP14+EGFR  Essential hypertension -     amLODipine (NORVASC) 5 MG tablet; Take 1 tablet (5 mg total) by mouth daily. -     CBC with Differential/Platelet -     CMP14+EGFR -     Urinalysis  Arthritis -     CBC with Differential/Platelet -     CMP14+EGFR  Hyperlipidemia, unspecified  hyperlipidemia type -     pravastatin (PRAVACHOL) 40 MG tablet; Take 1 tablet (40 mg total) by mouth daily. -     CBC with Differential/Platelet -     CMP14+EGFR -     Lipid panel  Long term systemic steroid user -     DG Bone Density; Future -     CBC with Differential/Platelet -     CMP14+EGFR  Vitamin D deficiency -     CBC with Differential/Platelet -     CMP14+EGFR -     VITAMIN D 25 Hydroxy (Vit-D Deficiency, Fractures)  Screening for prostate cancer -     CBC with Differential/Platelet -     CMP14+EGFR -     PSA, total and free  Other orders -     montelukast (SINGULAIR) 10 MG tablet; Take 1 tablet (10 mg total) by mouth at bedtime. -     pantoprazole (PROTONIX) 40 MG tablet; Take 1 tablet (40 mg total) by mouth daily.  I have discontinued Kathlene November. Matura's sulfamethoxazole-trimethoprim, predniSONE, and DULoxetine. I am also having him maintain his aspirin, cetirizine, Tacrolimus ER, MAGNESIUM GLYCINATE PO, acetaminophen, polyethylene glycol powder, Calcium Citrate-Vitamin D, Multi-Vitamin, fluticasone, Restasis, risedronate, Mycophenolate Mofetil (CELLCEPT PO), erythromycin, gabapentin, amLODipine, montelukast, pantoprazole, and pravastatin.  Meds ordered this encounter  Medications   amLODipine (NORVASC) 5 MG tablet    Sig: Take 1 tablet (5 mg total) by mouth daily.    Dispense:  90 tablet    Refill:  3   montelukast (SINGULAIR) 10 MG tablet    Sig: Take 1 tablet (10 mg total) by mouth at bedtime.    Dispense:  90 tablet    Refill:  3   pantoprazole (PROTONIX) 40 MG tablet    Sig: Take 1 tablet (40 mg total) by mouth daily.    Dispense:  90 tablet    Refill:  3   pravastatin (PRAVACHOL) 40 MG tablet    Sig: Take 1 tablet (40 mg total) by mouth daily.    Dispense:  90 tablet    Refill:  3     Follow-up: No follow-ups on file.  Claretta Fraise, M.D.

## 2021-08-07 NOTE — Progress Notes (Signed)
Hello [place],  Your lab result is normal and/or stable.Some minor variations that are not significant are commonly marked abnormal, but do not represent any medical problem for you.  Best regards, Claretta Fraise, M.D.

## 2021-08-08 LAB — CBC WITH DIFFERENTIAL/PLATELET
Basophils Absolute: 0.1 10*3/uL (ref 0.0–0.2)
Basos: 1 %
EOS (ABSOLUTE): 0.1 10*3/uL (ref 0.0–0.4)
Eos: 1 %
Hematocrit: 38.7 % (ref 37.5–51.0)
Hemoglobin: 12.6 g/dL — ABNORMAL LOW (ref 13.0–17.7)
Immature Grans (Abs): 0.2 10*3/uL — ABNORMAL HIGH (ref 0.0–0.1)
Immature Granulocytes: 3 %
Lymphocytes Absolute: 0.6 10*3/uL — ABNORMAL LOW (ref 0.7–3.1)
Lymphs: 8 %
MCH: 27.2 pg (ref 26.6–33.0)
MCHC: 32.6 g/dL (ref 31.5–35.7)
MCV: 83 fL (ref 79–97)
Monocytes Absolute: 0.5 10*3/uL (ref 0.1–0.9)
Monocytes: 7 %
Neutrophils Absolute: 5.8 10*3/uL (ref 1.4–7.0)
Neutrophils: 80 %
Platelets: 289 10*3/uL (ref 150–450)
RBC: 4.64 x10E6/uL (ref 4.14–5.80)
RDW: 14.1 % (ref 11.6–15.4)
WBC: 7.2 10*3/uL (ref 3.4–10.8)

## 2021-08-08 LAB — CMP14+EGFR
ALT: 8 IU/L (ref 0–44)
AST: 19 IU/L (ref 0–40)
Albumin/Globulin Ratio: 2.4 — ABNORMAL HIGH (ref 1.2–2.2)
Albumin: 4.6 g/dL (ref 3.7–4.7)
Alkaline Phosphatase: 57 IU/L (ref 44–121)
BUN/Creatinine Ratio: 14 (ref 10–24)
BUN: 16 mg/dL (ref 8–27)
Bilirubin Total: 0.4 mg/dL (ref 0.0–1.2)
CO2: 22 mmol/L (ref 20–29)
Calcium: 9.5 mg/dL (ref 8.6–10.2)
Chloride: 102 mmol/L (ref 96–106)
Creatinine, Ser: 1.15 mg/dL (ref 0.76–1.27)
Globulin, Total: 1.9 g/dL (ref 1.5–4.5)
Glucose: 100 mg/dL — ABNORMAL HIGH (ref 70–99)
Potassium: 4.6 mmol/L (ref 3.5–5.2)
Sodium: 139 mmol/L (ref 134–144)
Total Protein: 6.5 g/dL (ref 6.0–8.5)
eGFR: 68 mL/min/{1.73_m2} (ref >59)

## 2021-08-08 LAB — LIPID PANEL
Chol/HDL Ratio: 3.3 ratio (ref 0.0–5.0)
Cholesterol, Total: 205 mg/dL — ABNORMAL HIGH (ref 100–199)
HDL: 63 mg/dL (ref >39)
LDL Chol Calc (NIH): 115 mg/dL — ABNORMAL HIGH (ref 0–99)
Triglycerides: 158 mg/dL — ABNORMAL HIGH (ref 0–149)
VLDL Cholesterol Cal: 27 mg/dL (ref 5–40)

## 2021-08-08 LAB — VITAMIN D 25 HYDROXY (VIT D DEFICIENCY, FRACTURES): Vit D, 25-Hydroxy: 43 ng/mL (ref 30.0–100.0)

## 2021-08-08 LAB — PSA, TOTAL AND FREE
PSA, Free Pct: 45 %
PSA, Free: 0.18 ng/mL
Prostate Specific Ag, Serum: 0.4 ng/mL (ref 0.0–4.0)

## 2021-08-21 ENCOUNTER — Other Ambulatory Visit: Payer: Self-pay

## 2021-08-21 ENCOUNTER — Ambulatory Visit (INDEPENDENT_AMBULATORY_CARE_PROVIDER_SITE_OTHER): Payer: Medicare Other

## 2021-08-21 DIAGNOSIS — Z942 Lung transplant status: Secondary | ICD-10-CM

## 2021-08-21 DIAGNOSIS — Z7952 Long term (current) use of systemic steroids: Secondary | ICD-10-CM | POA: Diagnosis not present

## 2021-08-23 DIAGNOSIS — M85852 Other specified disorders of bone density and structure, left thigh: Secondary | ICD-10-CM | POA: Diagnosis not present

## 2021-08-26 ENCOUNTER — Telehealth: Payer: Self-pay | Admitting: Family Medicine

## 2021-08-26 DIAGNOSIS — L570 Actinic keratosis: Secondary | ICD-10-CM | POA: Diagnosis not present

## 2021-08-26 NOTE — Telephone Encounter (Signed)
Please call patient with Bone Density results.

## 2021-09-05 DIAGNOSIS — T86810 Lung transplant rejection: Secondary | ICD-10-CM | POA: Diagnosis not present

## 2021-09-05 DIAGNOSIS — J8410 Pulmonary fibrosis, unspecified: Secondary | ICD-10-CM | POA: Diagnosis not present

## 2021-09-05 DIAGNOSIS — N1831 Chronic kidney disease, stage 3a: Secondary | ICD-10-CM | POA: Diagnosis not present

## 2021-09-05 DIAGNOSIS — Z942 Lung transplant status: Secondary | ICD-10-CM | POA: Diagnosis not present

## 2021-09-05 DIAGNOSIS — E785 Hyperlipidemia, unspecified: Secondary | ICD-10-CM | POA: Diagnosis not present

## 2021-09-05 DIAGNOSIS — Z7952 Long term (current) use of systemic steroids: Secondary | ICD-10-CM | POA: Diagnosis not present

## 2021-09-05 DIAGNOSIS — I480 Paroxysmal atrial fibrillation: Secondary | ICD-10-CM | POA: Diagnosis not present

## 2021-09-05 DIAGNOSIS — Z79899 Other long term (current) drug therapy: Secondary | ICD-10-CM | POA: Diagnosis not present

## 2021-09-05 DIAGNOSIS — I10 Essential (primary) hypertension: Secondary | ICD-10-CM | POA: Diagnosis not present

## 2021-09-05 DIAGNOSIS — Z5181 Encounter for therapeutic drug level monitoring: Secondary | ICD-10-CM | POA: Diagnosis not present

## 2021-09-05 DIAGNOSIS — D849 Immunodeficiency, unspecified: Secondary | ICD-10-CM | POA: Diagnosis not present

## 2021-09-05 DIAGNOSIS — T86819 Unspecified complication of lung transplant: Secondary | ICD-10-CM | POA: Diagnosis not present

## 2021-09-05 DIAGNOSIS — D801 Nonfamilial hypogammaglobulinemia: Secondary | ICD-10-CM | POA: Diagnosis not present

## 2021-09-20 DIAGNOSIS — D849 Immunodeficiency, unspecified: Secondary | ICD-10-CM | POA: Diagnosis not present

## 2021-09-20 DIAGNOSIS — Z942 Lung transplant status: Secondary | ICD-10-CM | POA: Diagnosis not present

## 2021-09-20 DIAGNOSIS — Z79899 Other long term (current) drug therapy: Secondary | ICD-10-CM | POA: Diagnosis not present

## 2021-09-20 DIAGNOSIS — T86818 Other complications of lung transplant: Secondary | ICD-10-CM | POA: Diagnosis not present

## 2021-09-20 DIAGNOSIS — Z5181 Encounter for therapeutic drug level monitoring: Secondary | ICD-10-CM | POA: Diagnosis not present

## 2021-09-23 DIAGNOSIS — Z4824 Encounter for aftercare following lung transplant: Secondary | ICD-10-CM | POA: Diagnosis not present

## 2021-09-24 DIAGNOSIS — Z4824 Encounter for aftercare following lung transplant: Secondary | ICD-10-CM | POA: Diagnosis not present

## 2021-09-25 DIAGNOSIS — Z4824 Encounter for aftercare following lung transplant: Secondary | ICD-10-CM | POA: Diagnosis not present

## 2021-10-04 DIAGNOSIS — T86818 Other complications of lung transplant: Secondary | ICD-10-CM | POA: Diagnosis not present

## 2021-10-04 DIAGNOSIS — D849 Immunodeficiency, unspecified: Secondary | ICD-10-CM | POA: Diagnosis not present

## 2021-10-04 DIAGNOSIS — Z5181 Encounter for therapeutic drug level monitoring: Secondary | ICD-10-CM | POA: Diagnosis not present

## 2021-10-04 DIAGNOSIS — Z79899 Other long term (current) drug therapy: Secondary | ICD-10-CM | POA: Diagnosis not present

## 2021-10-04 DIAGNOSIS — D801 Nonfamilial hypogammaglobulinemia: Secondary | ICD-10-CM | POA: Diagnosis not present

## 2021-10-17 ENCOUNTER — Ambulatory Visit (INDEPENDENT_AMBULATORY_CARE_PROVIDER_SITE_OTHER): Payer: Medicare Other | Admitting: Nurse Practitioner

## 2021-10-17 ENCOUNTER — Encounter: Payer: Self-pay | Admitting: Nurse Practitioner

## 2021-10-17 DIAGNOSIS — Z20822 Contact with and (suspected) exposure to covid-19: Secondary | ICD-10-CM | POA: Insufficient documentation

## 2021-10-17 NOTE — Patient Instructions (Signed)
10 Things You Can Do to Manage Your COVID-19 Symptoms at Home ?If you have possible or confirmed COVID-19 ?Stay home except to get medical care. ?Monitor your symptoms carefully. If your symptoms get worse, call your healthcare provider immediately. ?Get rest and stay hydrated. ?If you have a medical appointment, call the healthcare provider ahead of time and tell them that you have or may have COVID-19. ?For medical emergencies, call 911 and notify the dispatch personnel that you have or may have COVID-19. ?Cover your cough and sneezes with a tissue or use the inside of your elbow. ?Wash your hands often with soap and water for at least 20 seconds or clean your hands with an alcohol-based hand sanitizer that contains at least 60% alcohol. ?As much as possible, stay in a specific room and away from other people in your home. Also, you should use a separate bathroom, if available. If you need to be around other people in or outside of the home, wear a mask. ?Avoid sharing personal items with other people in your household, like dishes, towels, and bedding. ?Clean all surfaces that are touched often, like counters, tabletops, and doorknobs. Use household cleaning sprays or wipes according to the label instructions. ?cdc.gov/coronavirus ?05/04/2020 ?This information is not intended to replace advice given to you by your health care provider. Make sure you discuss any questions you have with your health care provider. ?Document Revised: 06/28/2021 Document Reviewed: 06/28/2021 ?Elsevier Patient Education ? 2022 Elsevier Inc[employer] ? ?

## 2021-10-17 NOTE — Assessment & Plan Note (Signed)
°  Patient exposed to positive COVID-19.  Wife is currently positive for COVID.  Patient has no symptoms but is willing to test due to his upcoming [facility] appointments.

## 2021-10-17 NOTE — Progress Notes (Signed)
° °  Virtual Visit  Note Due to COVID-19 pandemic this visit was conducted virtually. This visit type was conducted due to national recommendations for restrictions regarding the COVID-19 Pandemic (e.g. social distancing, sheltering in place) in an effort to limit this patient's exposure and mitigate transmission in our community. All issues noted in this document were discussed and addressed.  A physical exam was not performed with this format.  I connected with Vernon Richard on 10/17/21 at 2:10 PM by telephone and verified that I am speaking with the correct person using two identifiers. Vernon Richard is currently located at home and wife is currently with patient during visit. The provider, Ivy Lynn, NP is located in their office at time of visit.  I discussed the limitations, risks, security and privacy concerns of performing an evaluation and management service by telephone and the availability of in person appointments. I also discussed with the patient that there may be a patient responsible charge related to this service. The patient expressed understanding and agreed to proceed.   History and Present Illness:  HPI   Patient exposed to COVID-19 from spouse who is currently ill from COVID-19 virus.  Patient has no current symptoms.  Testing completed for upcoming [facility] appointment   Review of Systems  Constitutional:  Negative for chills and fever.  HENT: Negative.  Negative for congestion, sinus pain and sore throat.   Respiratory:  Negative for cough.   Cardiovascular: Negative.   Skin:  Negative for rash.  All other systems reviewed and are negative.   Observations/Objective: Televisit patient not in distress.  Assessment and Plan:  Patient exposed to positive COVID-19.  Wife is currently positive for COVID.  Patient has no symptoms but is willing to test due to his upcoming [facility] appointments.   Follow Up Instructions: Follow-up with worsening unresolved symptoms.     I discussed the assessment and treatment plan with the patient. The patient was provided an opportunity to ask questions and all were answered. The patient agreed with the plan and demonstrated an understanding of the instructions.   The patient was advised to call back or seek an in-person evaluation if the symptoms worsen or if the condition fails to improve as anticipated.  The above assessment and management plan was discussed with the patient. The patient verbalized understanding of and has agreed to the management plan. Patient is aware to call the clinic if symptoms persist or worsen. Patient is aware when to return to the clinic for a follow-up visit. Patient educated on when it is appropriate to go to the emergency department.   Time call ended: 2:16 PM  I provided 6 minutes of  non face-to-face time during this encounter.    Ivy Lynn, NP

## 2021-10-18 LAB — SARS-COV-2, NAA 2 DAY TAT

## 2021-10-18 LAB — NOVEL CORONAVIRUS, NAA: SARS-CoV-2, NAA: NOT DETECTED

## 2021-10-23 DIAGNOSIS — E785 Hyperlipidemia, unspecified: Secondary | ICD-10-CM | POA: Diagnosis not present

## 2021-10-23 DIAGNOSIS — Z7952 Long term (current) use of systemic steroids: Secondary | ICD-10-CM | POA: Diagnosis not present

## 2021-10-23 DIAGNOSIS — N182 Chronic kidney disease, stage 2 (mild): Secondary | ICD-10-CM | POA: Diagnosis not present

## 2021-10-23 DIAGNOSIS — Z5181 Encounter for therapeutic drug level monitoring: Secondary | ICD-10-CM | POA: Diagnosis not present

## 2021-10-23 DIAGNOSIS — I129 Hypertensive chronic kidney disease with stage 1 through stage 4 chronic kidney disease, or unspecified chronic kidney disease: Secondary | ICD-10-CM | POA: Diagnosis not present

## 2021-10-23 DIAGNOSIS — T86818 Other complications of lung transplant: Secondary | ICD-10-CM | POA: Diagnosis not present

## 2021-10-23 DIAGNOSIS — M1990 Unspecified osteoarthritis, unspecified site: Secondary | ICD-10-CM | POA: Diagnosis not present

## 2021-10-23 DIAGNOSIS — M25569 Pain in unspecified knee: Secondary | ICD-10-CM | POA: Diagnosis not present

## 2021-10-23 DIAGNOSIS — Z7982 Long term (current) use of aspirin: Secondary | ICD-10-CM | POA: Diagnosis not present

## 2021-10-23 DIAGNOSIS — Z79899 Other long term (current) drug therapy: Secondary | ICD-10-CM | POA: Diagnosis not present

## 2021-10-23 DIAGNOSIS — T86810 Lung transplant rejection: Secondary | ICD-10-CM | POA: Diagnosis not present

## 2021-10-23 DIAGNOSIS — N1831 Chronic kidney disease, stage 3a: Secondary | ICD-10-CM | POA: Diagnosis not present

## 2021-10-23 DIAGNOSIS — Z96653 Presence of artificial knee joint, bilateral: Secondary | ICD-10-CM | POA: Diagnosis not present

## 2021-10-23 DIAGNOSIS — Z942 Lung transplant status: Secondary | ICD-10-CM | POA: Diagnosis not present

## 2021-10-23 DIAGNOSIS — Z8616 Personal history of COVID-19: Secondary | ICD-10-CM | POA: Diagnosis not present

## 2021-10-23 DIAGNOSIS — D849 Immunodeficiency, unspecified: Secondary | ICD-10-CM | POA: Diagnosis not present

## 2021-10-23 DIAGNOSIS — J84112 Idiopathic pulmonary fibrosis: Secondary | ICD-10-CM | POA: Diagnosis not present

## 2021-10-23 DIAGNOSIS — I480 Paroxysmal atrial fibrillation: Secondary | ICD-10-CM | POA: Diagnosis not present

## 2021-10-23 DIAGNOSIS — Z4824 Encounter for aftercare following lung transplant: Secondary | ICD-10-CM | POA: Diagnosis not present

## 2021-10-23 DIAGNOSIS — E1122 Type 2 diabetes mellitus with diabetic chronic kidney disease: Secondary | ICD-10-CM | POA: Diagnosis not present

## 2021-10-23 DIAGNOSIS — R251 Tremor, unspecified: Secondary | ICD-10-CM | POA: Diagnosis not present

## 2021-11-25 DIAGNOSIS — D849 Immunodeficiency, unspecified: Secondary | ICD-10-CM | POA: Diagnosis not present

## 2021-11-25 DIAGNOSIS — Z5181 Encounter for therapeutic drug level monitoring: Secondary | ICD-10-CM | POA: Diagnosis not present

## 2021-11-25 DIAGNOSIS — Z79899 Other long term (current) drug therapy: Secondary | ICD-10-CM | POA: Diagnosis not present

## 2021-11-25 DIAGNOSIS — Z942 Lung transplant status: Secondary | ICD-10-CM | POA: Diagnosis not present

## 2021-11-25 DIAGNOSIS — T86818 Other complications of lung transplant: Secondary | ICD-10-CM | POA: Diagnosis not present

## 2021-12-02 DIAGNOSIS — Z85828 Personal history of other malignant neoplasm of skin: Secondary | ICD-10-CM | POA: Diagnosis not present

## 2021-12-02 DIAGNOSIS — L570 Actinic keratosis: Secondary | ICD-10-CM | POA: Diagnosis not present

## 2021-12-02 DIAGNOSIS — L814 Other melanin hyperpigmentation: Secondary | ICD-10-CM | POA: Diagnosis not present

## 2021-12-02 DIAGNOSIS — L579 Skin changes due to chronic exposure to nonionizing radiation, unspecified: Secondary | ICD-10-CM | POA: Diagnosis not present

## 2021-12-02 DIAGNOSIS — L821 Other seborrheic keratosis: Secondary | ICD-10-CM | POA: Diagnosis not present

## 2021-12-03 DIAGNOSIS — M1611 Unilateral primary osteoarthritis, right hip: Secondary | ICD-10-CM | POA: Diagnosis not present

## 2021-12-03 DIAGNOSIS — M25559 Pain in unspecified hip: Secondary | ICD-10-CM | POA: Diagnosis not present

## 2021-12-17 DIAGNOSIS — D849 Immunodeficiency, unspecified: Secondary | ICD-10-CM | POA: Diagnosis not present

## 2021-12-17 DIAGNOSIS — Z5181 Encounter for therapeutic drug level monitoring: Secondary | ICD-10-CM | POA: Diagnosis not present

## 2021-12-17 DIAGNOSIS — Z298 Encounter for other specified prophylactic measures: Secondary | ICD-10-CM | POA: Diagnosis not present

## 2021-12-17 DIAGNOSIS — T86818 Other complications of lung transplant: Secondary | ICD-10-CM | POA: Diagnosis not present

## 2021-12-17 DIAGNOSIS — Z942 Lung transplant status: Secondary | ICD-10-CM | POA: Diagnosis not present

## 2021-12-17 DIAGNOSIS — Z79899 Other long term (current) drug therapy: Secondary | ICD-10-CM | POA: Diagnosis not present

## 2021-12-17 DIAGNOSIS — T86819 Unspecified complication of lung transplant: Secondary | ICD-10-CM | POA: Diagnosis not present

## 2021-12-26 DIAGNOSIS — E785 Hyperlipidemia, unspecified: Secondary | ICD-10-CM | POA: Diagnosis not present

## 2021-12-26 DIAGNOSIS — J309 Allergic rhinitis, unspecified: Secondary | ICD-10-CM | POA: Diagnosis not present

## 2021-12-26 DIAGNOSIS — N182 Chronic kidney disease, stage 2 (mild): Secondary | ICD-10-CM | POA: Diagnosis not present

## 2021-12-26 DIAGNOSIS — I10 Essential (primary) hypertension: Secondary | ICD-10-CM | POA: Diagnosis not present

## 2021-12-26 DIAGNOSIS — Z942 Lung transplant status: Secondary | ICD-10-CM | POA: Diagnosis not present

## 2021-12-26 DIAGNOSIS — Z01810 Encounter for preprocedural cardiovascular examination: Secondary | ICD-10-CM | POA: Diagnosis not present

## 2021-12-26 DIAGNOSIS — K219 Gastro-esophageal reflux disease without esophagitis: Secondary | ICD-10-CM | POA: Diagnosis not present

## 2021-12-26 DIAGNOSIS — M1611 Unilateral primary osteoarthritis, right hip: Secondary | ICD-10-CM | POA: Diagnosis not present

## 2021-12-26 DIAGNOSIS — Z01812 Encounter for preprocedural laboratory examination: Secondary | ICD-10-CM | POA: Diagnosis not present

## 2021-12-26 DIAGNOSIS — Z01818 Encounter for other preprocedural examination: Secondary | ICD-10-CM | POA: Diagnosis not present

## 2021-12-26 DIAGNOSIS — D801 Nonfamilial hypogammaglobulinemia: Secondary | ICD-10-CM | POA: Diagnosis not present

## 2021-12-26 DIAGNOSIS — J84112 Idiopathic pulmonary fibrosis: Secondary | ICD-10-CM | POA: Diagnosis not present

## 2022-01-02 DIAGNOSIS — D801 Nonfamilial hypogammaglobulinemia: Secondary | ICD-10-CM | POA: Diagnosis not present

## 2022-01-02 DIAGNOSIS — Z942 Lung transplant status: Secondary | ICD-10-CM | POA: Diagnosis not present

## 2022-01-02 DIAGNOSIS — D849 Immunodeficiency, unspecified: Secondary | ICD-10-CM | POA: Diagnosis not present

## 2022-01-08 DIAGNOSIS — I4891 Unspecified atrial fibrillation: Secondary | ICD-10-CM | POA: Diagnosis not present

## 2022-01-08 DIAGNOSIS — I2510 Atherosclerotic heart disease of native coronary artery without angina pectoris: Secondary | ICD-10-CM | POA: Diagnosis not present

## 2022-01-08 DIAGNOSIS — Z96641 Presence of right artificial hip joint: Secondary | ICD-10-CM | POA: Diagnosis not present

## 2022-01-08 DIAGNOSIS — Z79899 Other long term (current) drug therapy: Secondary | ICD-10-CM | POA: Diagnosis not present

## 2022-01-08 DIAGNOSIS — I10 Essential (primary) hypertension: Secondary | ICD-10-CM | POA: Diagnosis not present

## 2022-01-08 DIAGNOSIS — Z942 Lung transplant status: Secondary | ICD-10-CM | POA: Diagnosis not present

## 2022-01-08 DIAGNOSIS — K219 Gastro-esophageal reflux disease without esophagitis: Secondary | ICD-10-CM | POA: Diagnosis not present

## 2022-01-08 DIAGNOSIS — Z888 Allergy status to other drugs, medicaments and biological substances status: Secondary | ICD-10-CM | POA: Diagnosis not present

## 2022-01-08 DIAGNOSIS — E785 Hyperlipidemia, unspecified: Secondary | ICD-10-CM | POA: Diagnosis not present

## 2022-01-08 DIAGNOSIS — Z85828 Personal history of other malignant neoplasm of skin: Secondary | ICD-10-CM | POA: Diagnosis not present

## 2022-01-08 DIAGNOSIS — Z7982 Long term (current) use of aspirin: Secondary | ICD-10-CM | POA: Diagnosis not present

## 2022-01-08 DIAGNOSIS — M1611 Unilateral primary osteoarthritis, right hip: Secondary | ICD-10-CM | POA: Diagnosis not present

## 2022-01-08 DIAGNOSIS — Z886 Allergy status to analgesic agent status: Secondary | ICD-10-CM | POA: Diagnosis not present

## 2022-01-09 DIAGNOSIS — M1611 Unilateral primary osteoarthritis, right hip: Secondary | ICD-10-CM | POA: Diagnosis not present

## 2022-01-09 DIAGNOSIS — Z7982 Long term (current) use of aspirin: Secondary | ICD-10-CM | POA: Diagnosis not present

## 2022-01-09 DIAGNOSIS — Z942 Lung transplant status: Secondary | ICD-10-CM | POA: Diagnosis not present

## 2022-01-09 DIAGNOSIS — E785 Hyperlipidemia, unspecified: Secondary | ICD-10-CM | POA: Diagnosis not present

## 2022-01-09 DIAGNOSIS — I2510 Atherosclerotic heart disease of native coronary artery without angina pectoris: Secondary | ICD-10-CM | POA: Diagnosis not present

## 2022-01-09 DIAGNOSIS — I10 Essential (primary) hypertension: Secondary | ICD-10-CM | POA: Diagnosis not present

## 2022-01-09 DIAGNOSIS — I4891 Unspecified atrial fibrillation: Secondary | ICD-10-CM | POA: Diagnosis not present

## 2022-01-09 DIAGNOSIS — K219 Gastro-esophageal reflux disease without esophagitis: Secondary | ICD-10-CM | POA: Diagnosis not present

## 2022-01-09 DIAGNOSIS — Z886 Allergy status to analgesic agent status: Secondary | ICD-10-CM | POA: Diagnosis not present

## 2022-01-09 DIAGNOSIS — Z85828 Personal history of other malignant neoplasm of skin: Secondary | ICD-10-CM | POA: Diagnosis not present

## 2022-01-09 DIAGNOSIS — Z888 Allergy status to other drugs, medicaments and biological substances status: Secondary | ICD-10-CM | POA: Diagnosis not present

## 2022-01-09 DIAGNOSIS — Z79899 Other long term (current) drug therapy: Secondary | ICD-10-CM | POA: Diagnosis not present

## 2022-01-10 DIAGNOSIS — Z7982 Long term (current) use of aspirin: Secondary | ICD-10-CM | POA: Diagnosis not present

## 2022-01-10 DIAGNOSIS — I10 Essential (primary) hypertension: Secondary | ICD-10-CM | POA: Diagnosis not present

## 2022-01-10 DIAGNOSIS — Z8616 Personal history of COVID-19: Secondary | ICD-10-CM | POA: Diagnosis not present

## 2022-01-10 DIAGNOSIS — J9611 Chronic respiratory failure with hypoxia: Secondary | ICD-10-CM | POA: Diagnosis not present

## 2022-01-10 DIAGNOSIS — Z86010 Personal history of colonic polyps: Secondary | ICD-10-CM | POA: Diagnosis not present

## 2022-01-10 DIAGNOSIS — J84112 Idiopathic pulmonary fibrosis: Secondary | ICD-10-CM | POA: Diagnosis not present

## 2022-01-10 DIAGNOSIS — Z96653 Presence of artificial knee joint, bilateral: Secondary | ICD-10-CM | POA: Diagnosis not present

## 2022-01-10 DIAGNOSIS — Z471 Aftercare following joint replacement surgery: Secondary | ICD-10-CM | POA: Diagnosis not present

## 2022-01-10 DIAGNOSIS — J8489 Other specified interstitial pulmonary diseases: Secondary | ICD-10-CM | POA: Diagnosis not present

## 2022-01-10 DIAGNOSIS — Z85828 Personal history of other malignant neoplasm of skin: Secondary | ICD-10-CM | POA: Diagnosis not present

## 2022-01-10 DIAGNOSIS — Z96641 Presence of right artificial hip joint: Secondary | ICD-10-CM | POA: Diagnosis not present

## 2022-01-10 DIAGNOSIS — I2510 Atherosclerotic heart disease of native coronary artery without angina pectoris: Secondary | ICD-10-CM | POA: Diagnosis not present

## 2022-01-10 DIAGNOSIS — Z9181 History of falling: Secondary | ICD-10-CM | POA: Diagnosis not present

## 2022-01-10 DIAGNOSIS — E785 Hyperlipidemia, unspecified: Secondary | ICD-10-CM | POA: Diagnosis not present

## 2022-01-10 DIAGNOSIS — K219 Gastro-esophageal reflux disease without esophagitis: Secondary | ICD-10-CM | POA: Diagnosis not present

## 2022-01-10 DIAGNOSIS — Z7952 Long term (current) use of systemic steroids: Secondary | ICD-10-CM | POA: Diagnosis not present

## 2022-01-10 DIAGNOSIS — K5790 Diverticulosis of intestine, part unspecified, without perforation or abscess without bleeding: Secondary | ICD-10-CM | POA: Diagnosis not present

## 2022-01-13 DIAGNOSIS — K5790 Diverticulosis of intestine, part unspecified, without perforation or abscess without bleeding: Secondary | ICD-10-CM | POA: Diagnosis not present

## 2022-01-13 DIAGNOSIS — I10 Essential (primary) hypertension: Secondary | ICD-10-CM | POA: Diagnosis not present

## 2022-01-13 DIAGNOSIS — Z96653 Presence of artificial knee joint, bilateral: Secondary | ICD-10-CM | POA: Diagnosis not present

## 2022-01-13 DIAGNOSIS — Z7982 Long term (current) use of aspirin: Secondary | ICD-10-CM | POA: Diagnosis not present

## 2022-01-13 DIAGNOSIS — Z96641 Presence of right artificial hip joint: Secondary | ICD-10-CM | POA: Diagnosis not present

## 2022-01-13 DIAGNOSIS — K219 Gastro-esophageal reflux disease without esophagitis: Secondary | ICD-10-CM | POA: Diagnosis not present

## 2022-01-13 DIAGNOSIS — E785 Hyperlipidemia, unspecified: Secondary | ICD-10-CM | POA: Diagnosis not present

## 2022-01-13 DIAGNOSIS — J84112 Idiopathic pulmonary fibrosis: Secondary | ICD-10-CM | POA: Diagnosis not present

## 2022-01-13 DIAGNOSIS — Z7952 Long term (current) use of systemic steroids: Secondary | ICD-10-CM | POA: Diagnosis not present

## 2022-01-13 DIAGNOSIS — Z471 Aftercare following joint replacement surgery: Secondary | ICD-10-CM | POA: Diagnosis not present

## 2022-01-13 DIAGNOSIS — Z86010 Personal history of colonic polyps: Secondary | ICD-10-CM | POA: Diagnosis not present

## 2022-01-13 DIAGNOSIS — J9611 Chronic respiratory failure with hypoxia: Secondary | ICD-10-CM | POA: Diagnosis not present

## 2022-01-13 DIAGNOSIS — Z9181 History of falling: Secondary | ICD-10-CM | POA: Diagnosis not present

## 2022-01-13 DIAGNOSIS — J8489 Other specified interstitial pulmonary diseases: Secondary | ICD-10-CM | POA: Diagnosis not present

## 2022-01-13 DIAGNOSIS — Z8616 Personal history of COVID-19: Secondary | ICD-10-CM | POA: Diagnosis not present

## 2022-01-13 DIAGNOSIS — I2510 Atherosclerotic heart disease of native coronary artery without angina pectoris: Secondary | ICD-10-CM | POA: Diagnosis not present

## 2022-01-13 DIAGNOSIS — Z85828 Personal history of other malignant neoplasm of skin: Secondary | ICD-10-CM | POA: Diagnosis not present

## 2022-01-15 DIAGNOSIS — J9611 Chronic respiratory failure with hypoxia: Secondary | ICD-10-CM | POA: Diagnosis not present

## 2022-01-15 DIAGNOSIS — I10 Essential (primary) hypertension: Secondary | ICD-10-CM | POA: Diagnosis not present

## 2022-01-15 DIAGNOSIS — J8489 Other specified interstitial pulmonary diseases: Secondary | ICD-10-CM | POA: Diagnosis not present

## 2022-01-15 DIAGNOSIS — Z7952 Long term (current) use of systemic steroids: Secondary | ICD-10-CM | POA: Diagnosis not present

## 2022-01-15 DIAGNOSIS — J84112 Idiopathic pulmonary fibrosis: Secondary | ICD-10-CM | POA: Diagnosis not present

## 2022-01-15 DIAGNOSIS — Z7982 Long term (current) use of aspirin: Secondary | ICD-10-CM | POA: Diagnosis not present

## 2022-01-15 DIAGNOSIS — Z96641 Presence of right artificial hip joint: Secondary | ICD-10-CM | POA: Diagnosis not present

## 2022-01-15 DIAGNOSIS — K219 Gastro-esophageal reflux disease without esophagitis: Secondary | ICD-10-CM | POA: Diagnosis not present

## 2022-01-15 DIAGNOSIS — Z85828 Personal history of other malignant neoplasm of skin: Secondary | ICD-10-CM | POA: Diagnosis not present

## 2022-01-15 DIAGNOSIS — Z471 Aftercare following joint replacement surgery: Secondary | ICD-10-CM | POA: Diagnosis not present

## 2022-01-15 DIAGNOSIS — I2510 Atherosclerotic heart disease of native coronary artery without angina pectoris: Secondary | ICD-10-CM | POA: Diagnosis not present

## 2022-01-15 DIAGNOSIS — E785 Hyperlipidemia, unspecified: Secondary | ICD-10-CM | POA: Diagnosis not present

## 2022-01-15 DIAGNOSIS — Z9181 History of falling: Secondary | ICD-10-CM | POA: Diagnosis not present

## 2022-01-15 DIAGNOSIS — Z96653 Presence of artificial knee joint, bilateral: Secondary | ICD-10-CM | POA: Diagnosis not present

## 2022-01-15 DIAGNOSIS — K5790 Diverticulosis of intestine, part unspecified, without perforation or abscess without bleeding: Secondary | ICD-10-CM | POA: Diagnosis not present

## 2022-01-15 DIAGNOSIS — Z8616 Personal history of COVID-19: Secondary | ICD-10-CM | POA: Diagnosis not present

## 2022-01-15 DIAGNOSIS — Z86010 Personal history of colonic polyps: Secondary | ICD-10-CM | POA: Diagnosis not present

## 2022-01-17 DIAGNOSIS — K5790 Diverticulosis of intestine, part unspecified, without perforation or abscess without bleeding: Secondary | ICD-10-CM | POA: Diagnosis not present

## 2022-01-17 DIAGNOSIS — Z86010 Personal history of colonic polyps: Secondary | ICD-10-CM | POA: Diagnosis not present

## 2022-01-17 DIAGNOSIS — I10 Essential (primary) hypertension: Secondary | ICD-10-CM | POA: Diagnosis not present

## 2022-01-17 DIAGNOSIS — Z9181 History of falling: Secondary | ICD-10-CM | POA: Diagnosis not present

## 2022-01-17 DIAGNOSIS — J84112 Idiopathic pulmonary fibrosis: Secondary | ICD-10-CM | POA: Diagnosis not present

## 2022-01-17 DIAGNOSIS — J8489 Other specified interstitial pulmonary diseases: Secondary | ICD-10-CM | POA: Diagnosis not present

## 2022-01-17 DIAGNOSIS — K219 Gastro-esophageal reflux disease without esophagitis: Secondary | ICD-10-CM | POA: Diagnosis not present

## 2022-01-17 DIAGNOSIS — E785 Hyperlipidemia, unspecified: Secondary | ICD-10-CM | POA: Diagnosis not present

## 2022-01-17 DIAGNOSIS — Z8616 Personal history of COVID-19: Secondary | ICD-10-CM | POA: Diagnosis not present

## 2022-01-17 DIAGNOSIS — Z7952 Long term (current) use of systemic steroids: Secondary | ICD-10-CM | POA: Diagnosis not present

## 2022-01-17 DIAGNOSIS — Z471 Aftercare following joint replacement surgery: Secondary | ICD-10-CM | POA: Diagnosis not present

## 2022-01-17 DIAGNOSIS — Z96653 Presence of artificial knee joint, bilateral: Secondary | ICD-10-CM | POA: Diagnosis not present

## 2022-01-17 DIAGNOSIS — J9611 Chronic respiratory failure with hypoxia: Secondary | ICD-10-CM | POA: Diagnosis not present

## 2022-01-17 DIAGNOSIS — Z96641 Presence of right artificial hip joint: Secondary | ICD-10-CM | POA: Diagnosis not present

## 2022-01-17 DIAGNOSIS — I2510 Atherosclerotic heart disease of native coronary artery without angina pectoris: Secondary | ICD-10-CM | POA: Diagnosis not present

## 2022-01-17 DIAGNOSIS — Z7982 Long term (current) use of aspirin: Secondary | ICD-10-CM | POA: Diagnosis not present

## 2022-01-17 DIAGNOSIS — Z85828 Personal history of other malignant neoplasm of skin: Secondary | ICD-10-CM | POA: Diagnosis not present

## 2022-01-20 DIAGNOSIS — Z298 Encounter for other specified prophylactic measures: Secondary | ICD-10-CM | POA: Diagnosis not present

## 2022-01-20 DIAGNOSIS — Z7952 Long term (current) use of systemic steroids: Secondary | ICD-10-CM | POA: Diagnosis not present

## 2022-01-20 DIAGNOSIS — K219 Gastro-esophageal reflux disease without esophagitis: Secondary | ICD-10-CM | POA: Diagnosis not present

## 2022-01-20 DIAGNOSIS — I10 Essential (primary) hypertension: Secondary | ICD-10-CM | POA: Diagnosis not present

## 2022-01-20 DIAGNOSIS — Z8616 Personal history of COVID-19: Secondary | ICD-10-CM | POA: Diagnosis not present

## 2022-01-20 DIAGNOSIS — T86819 Unspecified complication of lung transplant: Secondary | ICD-10-CM | POA: Diagnosis not present

## 2022-01-20 DIAGNOSIS — Z96641 Presence of right artificial hip joint: Secondary | ICD-10-CM | POA: Diagnosis not present

## 2022-01-20 DIAGNOSIS — Z96653 Presence of artificial knee joint, bilateral: Secondary | ICD-10-CM | POA: Diagnosis not present

## 2022-01-20 DIAGNOSIS — Z85828 Personal history of other malignant neoplasm of skin: Secondary | ICD-10-CM | POA: Diagnosis not present

## 2022-01-20 DIAGNOSIS — Z5181 Encounter for therapeutic drug level monitoring: Secondary | ICD-10-CM | POA: Diagnosis not present

## 2022-01-20 DIAGNOSIS — Z9181 History of falling: Secondary | ICD-10-CM | POA: Diagnosis not present

## 2022-01-20 DIAGNOSIS — J8489 Other specified interstitial pulmonary diseases: Secondary | ICD-10-CM | POA: Diagnosis not present

## 2022-01-20 DIAGNOSIS — J9611 Chronic respiratory failure with hypoxia: Secondary | ICD-10-CM | POA: Diagnosis not present

## 2022-01-20 DIAGNOSIS — E785 Hyperlipidemia, unspecified: Secondary | ICD-10-CM | POA: Diagnosis not present

## 2022-01-20 DIAGNOSIS — K5790 Diverticulosis of intestine, part unspecified, without perforation or abscess without bleeding: Secondary | ICD-10-CM | POA: Diagnosis not present

## 2022-01-20 DIAGNOSIS — Z7982 Long term (current) use of aspirin: Secondary | ICD-10-CM | POA: Diagnosis not present

## 2022-01-20 DIAGNOSIS — J84112 Idiopathic pulmonary fibrosis: Secondary | ICD-10-CM | POA: Diagnosis not present

## 2022-01-20 DIAGNOSIS — Z79899 Other long term (current) drug therapy: Secondary | ICD-10-CM | POA: Diagnosis not present

## 2022-01-20 DIAGNOSIS — Z86010 Personal history of colonic polyps: Secondary | ICD-10-CM | POA: Diagnosis not present

## 2022-01-20 DIAGNOSIS — Z471 Aftercare following joint replacement surgery: Secondary | ICD-10-CM | POA: Diagnosis not present

## 2022-01-20 DIAGNOSIS — I2510 Atherosclerotic heart disease of native coronary artery without angina pectoris: Secondary | ICD-10-CM | POA: Diagnosis not present

## 2022-01-22 DIAGNOSIS — I10 Essential (primary) hypertension: Secondary | ICD-10-CM | POA: Diagnosis not present

## 2022-01-22 DIAGNOSIS — K5790 Diverticulosis of intestine, part unspecified, without perforation or abscess without bleeding: Secondary | ICD-10-CM | POA: Diagnosis not present

## 2022-01-22 DIAGNOSIS — Z86010 Personal history of colonic polyps: Secondary | ICD-10-CM | POA: Diagnosis not present

## 2022-01-22 DIAGNOSIS — J9611 Chronic respiratory failure with hypoxia: Secondary | ICD-10-CM | POA: Diagnosis not present

## 2022-01-22 DIAGNOSIS — J8489 Other specified interstitial pulmonary diseases: Secondary | ICD-10-CM | POA: Diagnosis not present

## 2022-01-22 DIAGNOSIS — Z9181 History of falling: Secondary | ICD-10-CM | POA: Diagnosis not present

## 2022-01-22 DIAGNOSIS — K219 Gastro-esophageal reflux disease without esophagitis: Secondary | ICD-10-CM | POA: Diagnosis not present

## 2022-01-22 DIAGNOSIS — Z96641 Presence of right artificial hip joint: Secondary | ICD-10-CM | POA: Diagnosis not present

## 2022-01-22 DIAGNOSIS — I2510 Atherosclerotic heart disease of native coronary artery without angina pectoris: Secondary | ICD-10-CM | POA: Diagnosis not present

## 2022-01-22 DIAGNOSIS — J84112 Idiopathic pulmonary fibrosis: Secondary | ICD-10-CM | POA: Diagnosis not present

## 2022-01-22 DIAGNOSIS — Z7952 Long term (current) use of systemic steroids: Secondary | ICD-10-CM | POA: Diagnosis not present

## 2022-01-22 DIAGNOSIS — Z471 Aftercare following joint replacement surgery: Secondary | ICD-10-CM | POA: Diagnosis not present

## 2022-01-22 DIAGNOSIS — E785 Hyperlipidemia, unspecified: Secondary | ICD-10-CM | POA: Diagnosis not present

## 2022-01-22 DIAGNOSIS — Z8616 Personal history of COVID-19: Secondary | ICD-10-CM | POA: Diagnosis not present

## 2022-01-22 DIAGNOSIS — Z96653 Presence of artificial knee joint, bilateral: Secondary | ICD-10-CM | POA: Diagnosis not present

## 2022-01-22 DIAGNOSIS — Z85828 Personal history of other malignant neoplasm of skin: Secondary | ICD-10-CM | POA: Diagnosis not present

## 2022-01-22 DIAGNOSIS — Z7982 Long term (current) use of aspirin: Secondary | ICD-10-CM | POA: Diagnosis not present

## 2022-01-24 DIAGNOSIS — J8489 Other specified interstitial pulmonary diseases: Secondary | ICD-10-CM | POA: Diagnosis not present

## 2022-01-24 DIAGNOSIS — Z96653 Presence of artificial knee joint, bilateral: Secondary | ICD-10-CM | POA: Diagnosis not present

## 2022-01-24 DIAGNOSIS — I2510 Atherosclerotic heart disease of native coronary artery without angina pectoris: Secondary | ICD-10-CM | POA: Diagnosis not present

## 2022-01-24 DIAGNOSIS — Z85828 Personal history of other malignant neoplasm of skin: Secondary | ICD-10-CM | POA: Diagnosis not present

## 2022-01-24 DIAGNOSIS — Z7952 Long term (current) use of systemic steroids: Secondary | ICD-10-CM | POA: Diagnosis not present

## 2022-01-24 DIAGNOSIS — E785 Hyperlipidemia, unspecified: Secondary | ICD-10-CM | POA: Diagnosis not present

## 2022-01-24 DIAGNOSIS — Z96641 Presence of right artificial hip joint: Secondary | ICD-10-CM | POA: Diagnosis not present

## 2022-01-24 DIAGNOSIS — K5790 Diverticulosis of intestine, part unspecified, without perforation or abscess without bleeding: Secondary | ICD-10-CM | POA: Diagnosis not present

## 2022-01-24 DIAGNOSIS — Z7982 Long term (current) use of aspirin: Secondary | ICD-10-CM | POA: Diagnosis not present

## 2022-01-24 DIAGNOSIS — K219 Gastro-esophageal reflux disease without esophagitis: Secondary | ICD-10-CM | POA: Diagnosis not present

## 2022-01-24 DIAGNOSIS — J9611 Chronic respiratory failure with hypoxia: Secondary | ICD-10-CM | POA: Diagnosis not present

## 2022-01-24 DIAGNOSIS — I10 Essential (primary) hypertension: Secondary | ICD-10-CM | POA: Diagnosis not present

## 2022-01-24 DIAGNOSIS — Z471 Aftercare following joint replacement surgery: Secondary | ICD-10-CM | POA: Diagnosis not present

## 2022-01-24 DIAGNOSIS — Z9181 History of falling: Secondary | ICD-10-CM | POA: Diagnosis not present

## 2022-01-24 DIAGNOSIS — Z86010 Personal history of colonic polyps: Secondary | ICD-10-CM | POA: Diagnosis not present

## 2022-01-24 DIAGNOSIS — Z8616 Personal history of COVID-19: Secondary | ICD-10-CM | POA: Diagnosis not present

## 2022-01-24 DIAGNOSIS — J84112 Idiopathic pulmonary fibrosis: Secondary | ICD-10-CM | POA: Diagnosis not present

## 2022-01-27 DIAGNOSIS — K5790 Diverticulosis of intestine, part unspecified, without perforation or abscess without bleeding: Secondary | ICD-10-CM | POA: Diagnosis not present

## 2022-01-27 DIAGNOSIS — J9611 Chronic respiratory failure with hypoxia: Secondary | ICD-10-CM | POA: Diagnosis not present

## 2022-01-27 DIAGNOSIS — Z7982 Long term (current) use of aspirin: Secondary | ICD-10-CM | POA: Diagnosis not present

## 2022-01-27 DIAGNOSIS — Z7952 Long term (current) use of systemic steroids: Secondary | ICD-10-CM | POA: Diagnosis not present

## 2022-01-27 DIAGNOSIS — Z96641 Presence of right artificial hip joint: Secondary | ICD-10-CM | POA: Diagnosis not present

## 2022-01-27 DIAGNOSIS — Z8616 Personal history of COVID-19: Secondary | ICD-10-CM | POA: Diagnosis not present

## 2022-01-27 DIAGNOSIS — Z96653 Presence of artificial knee joint, bilateral: Secondary | ICD-10-CM | POA: Diagnosis not present

## 2022-01-27 DIAGNOSIS — Z9181 History of falling: Secondary | ICD-10-CM | POA: Diagnosis not present

## 2022-01-27 DIAGNOSIS — J8489 Other specified interstitial pulmonary diseases: Secondary | ICD-10-CM | POA: Diagnosis not present

## 2022-01-27 DIAGNOSIS — Z86010 Personal history of colonic polyps: Secondary | ICD-10-CM | POA: Diagnosis not present

## 2022-01-27 DIAGNOSIS — Z85828 Personal history of other malignant neoplasm of skin: Secondary | ICD-10-CM | POA: Diagnosis not present

## 2022-01-27 DIAGNOSIS — I10 Essential (primary) hypertension: Secondary | ICD-10-CM | POA: Diagnosis not present

## 2022-01-27 DIAGNOSIS — J84112 Idiopathic pulmonary fibrosis: Secondary | ICD-10-CM | POA: Diagnosis not present

## 2022-01-27 DIAGNOSIS — E785 Hyperlipidemia, unspecified: Secondary | ICD-10-CM | POA: Diagnosis not present

## 2022-01-27 DIAGNOSIS — I2510 Atherosclerotic heart disease of native coronary artery without angina pectoris: Secondary | ICD-10-CM | POA: Diagnosis not present

## 2022-01-27 DIAGNOSIS — Z471 Aftercare following joint replacement surgery: Secondary | ICD-10-CM | POA: Diagnosis not present

## 2022-01-27 DIAGNOSIS — K219 Gastro-esophageal reflux disease without esophagitis: Secondary | ICD-10-CM | POA: Diagnosis not present

## 2022-01-31 DIAGNOSIS — K5790 Diverticulosis of intestine, part unspecified, without perforation or abscess without bleeding: Secondary | ICD-10-CM | POA: Diagnosis not present

## 2022-01-31 DIAGNOSIS — Z86010 Personal history of colonic polyps: Secondary | ICD-10-CM | POA: Diagnosis not present

## 2022-01-31 DIAGNOSIS — Z471 Aftercare following joint replacement surgery: Secondary | ICD-10-CM | POA: Diagnosis not present

## 2022-01-31 DIAGNOSIS — I10 Essential (primary) hypertension: Secondary | ICD-10-CM | POA: Diagnosis not present

## 2022-01-31 DIAGNOSIS — Z85828 Personal history of other malignant neoplasm of skin: Secondary | ICD-10-CM | POA: Diagnosis not present

## 2022-01-31 DIAGNOSIS — Z9181 History of falling: Secondary | ICD-10-CM | POA: Diagnosis not present

## 2022-01-31 DIAGNOSIS — Z7952 Long term (current) use of systemic steroids: Secondary | ICD-10-CM | POA: Diagnosis not present

## 2022-01-31 DIAGNOSIS — I2510 Atherosclerotic heart disease of native coronary artery without angina pectoris: Secondary | ICD-10-CM | POA: Diagnosis not present

## 2022-01-31 DIAGNOSIS — Z96653 Presence of artificial knee joint, bilateral: Secondary | ICD-10-CM | POA: Diagnosis not present

## 2022-01-31 DIAGNOSIS — E785 Hyperlipidemia, unspecified: Secondary | ICD-10-CM | POA: Diagnosis not present

## 2022-01-31 DIAGNOSIS — J9611 Chronic respiratory failure with hypoxia: Secondary | ICD-10-CM | POA: Diagnosis not present

## 2022-01-31 DIAGNOSIS — J8489 Other specified interstitial pulmonary diseases: Secondary | ICD-10-CM | POA: Diagnosis not present

## 2022-01-31 DIAGNOSIS — J84112 Idiopathic pulmonary fibrosis: Secondary | ICD-10-CM | POA: Diagnosis not present

## 2022-01-31 DIAGNOSIS — Z7982 Long term (current) use of aspirin: Secondary | ICD-10-CM | POA: Diagnosis not present

## 2022-01-31 DIAGNOSIS — Z96641 Presence of right artificial hip joint: Secondary | ICD-10-CM | POA: Diagnosis not present

## 2022-01-31 DIAGNOSIS — K219 Gastro-esophageal reflux disease without esophagitis: Secondary | ICD-10-CM | POA: Diagnosis not present

## 2022-01-31 DIAGNOSIS — Z8616 Personal history of COVID-19: Secondary | ICD-10-CM | POA: Diagnosis not present

## 2022-02-03 DIAGNOSIS — L579 Skin changes due to chronic exposure to nonionizing radiation, unspecified: Secondary | ICD-10-CM | POA: Diagnosis not present

## 2022-02-03 DIAGNOSIS — Z85828 Personal history of other malignant neoplasm of skin: Secondary | ICD-10-CM | POA: Diagnosis not present

## 2022-02-03 DIAGNOSIS — L821 Other seborrheic keratosis: Secondary | ICD-10-CM | POA: Diagnosis not present

## 2022-02-03 DIAGNOSIS — L814 Other melanin hyperpigmentation: Secondary | ICD-10-CM | POA: Diagnosis not present

## 2022-02-03 DIAGNOSIS — D225 Melanocytic nevi of trunk: Secondary | ICD-10-CM | POA: Diagnosis not present

## 2022-02-03 DIAGNOSIS — L570 Actinic keratosis: Secondary | ICD-10-CM | POA: Diagnosis not present

## 2022-02-04 DIAGNOSIS — Z96641 Presence of right artificial hip joint: Secondary | ICD-10-CM | POA: Diagnosis not present

## 2022-02-04 DIAGNOSIS — Z96653 Presence of artificial knee joint, bilateral: Secondary | ICD-10-CM | POA: Diagnosis not present

## 2022-02-04 DIAGNOSIS — J9611 Chronic respiratory failure with hypoxia: Secondary | ICD-10-CM | POA: Diagnosis not present

## 2022-02-04 DIAGNOSIS — Z471 Aftercare following joint replacement surgery: Secondary | ICD-10-CM | POA: Diagnosis not present

## 2022-02-04 DIAGNOSIS — Z7952 Long term (current) use of systemic steroids: Secondary | ICD-10-CM | POA: Diagnosis not present

## 2022-02-04 DIAGNOSIS — Z8616 Personal history of COVID-19: Secondary | ICD-10-CM | POA: Diagnosis not present

## 2022-02-04 DIAGNOSIS — Z7982 Long term (current) use of aspirin: Secondary | ICD-10-CM | POA: Diagnosis not present

## 2022-02-04 DIAGNOSIS — I10 Essential (primary) hypertension: Secondary | ICD-10-CM | POA: Diagnosis not present

## 2022-02-04 DIAGNOSIS — Z9181 History of falling: Secondary | ICD-10-CM | POA: Diagnosis not present

## 2022-02-04 DIAGNOSIS — E785 Hyperlipidemia, unspecified: Secondary | ICD-10-CM | POA: Diagnosis not present

## 2022-02-04 DIAGNOSIS — I2510 Atherosclerotic heart disease of native coronary artery without angina pectoris: Secondary | ICD-10-CM | POA: Diagnosis not present

## 2022-02-04 DIAGNOSIS — K219 Gastro-esophageal reflux disease without esophagitis: Secondary | ICD-10-CM | POA: Diagnosis not present

## 2022-02-04 DIAGNOSIS — Z86010 Personal history of colonic polyps: Secondary | ICD-10-CM | POA: Diagnosis not present

## 2022-02-04 DIAGNOSIS — J8489 Other specified interstitial pulmonary diseases: Secondary | ICD-10-CM | POA: Diagnosis not present

## 2022-02-04 DIAGNOSIS — K5790 Diverticulosis of intestine, part unspecified, without perforation or abscess without bleeding: Secondary | ICD-10-CM | POA: Diagnosis not present

## 2022-02-04 DIAGNOSIS — Z85828 Personal history of other malignant neoplasm of skin: Secondary | ICD-10-CM | POA: Diagnosis not present

## 2022-02-04 DIAGNOSIS — J84112 Idiopathic pulmonary fibrosis: Secondary | ICD-10-CM | POA: Diagnosis not present

## 2022-02-05 ENCOUNTER — Encounter: Payer: Self-pay | Admitting: Family Medicine

## 2022-02-05 ENCOUNTER — Ambulatory Visit (INDEPENDENT_AMBULATORY_CARE_PROVIDER_SITE_OTHER): Payer: Medicare Other | Admitting: Family Medicine

## 2022-02-05 VITALS — BP 123/69 | HR 71 | Temp 97.3°F | Ht 68.0 in | Wt 172.8 lb

## 2022-02-05 DIAGNOSIS — Z23 Encounter for immunization: Secondary | ICD-10-CM

## 2022-02-05 DIAGNOSIS — R799 Abnormal finding of blood chemistry, unspecified: Secondary | ICD-10-CM | POA: Diagnosis not present

## 2022-02-05 DIAGNOSIS — E782 Mixed hyperlipidemia: Secondary | ICD-10-CM | POA: Diagnosis not present

## 2022-02-05 DIAGNOSIS — Z942 Lung transplant status: Secondary | ICD-10-CM

## 2022-02-05 DIAGNOSIS — I10 Essential (primary) hypertension: Secondary | ICD-10-CM | POA: Diagnosis not present

## 2022-02-05 NOTE — Progress Notes (Signed)
? ?Subjective:  ?Patient ID: Vernon Richard, male    DOB: 11/20/1950  Age: 71 y.o. MRN: [id number] ? ?CC: Medical Management of Chronic Issues ? ? ?HPI ?Vernon Richard presents for follow-up on his general medical condition.  His primary medical concern is his lung transplant.  He had some rejection in the fall.  His medications now include tacrolimus and CellCept.  He denies shortness of breath.  The rejection was acute and has resolved. ? ? Follow-up of hypertension. Patient has no history of headache chest pain or shortness of breath or recent cough. Patient also denies symptoms of TIA such as numbness weakness lateralizing. Patient checks  blood pressure at home and has not had any elevated readings recently. Patient denies side effects from his medication. States taking it regularly. ? ?Patient in for follow-up of elevated cholesterol. Doing well without complaints on current medication. Denies side effects of statin including myalgia and arthralgia and nausea. Also in today for liver function testing. Currently no chest pain, shortness of breath or other cardiovascular related symptoms noted. ? ?Patient in for follow-up of GERD. Currently asymptomatic taking  PPI daily. There is no chest pain or heartburn. No hematemesis and no melena. No dysphagia or choking. Onset is remote. Progression is stable. Complicating factors, none. ? ? ? ?  02/05/2022  ?  8:00 AM 08/07/2021  ? 10:31 AM 08/01/2021  ?  3:34 PM  ?Depression screen PHQ 2/9  ?Decreased Interest 0 0 0  ?Down, Depressed, Hopeless 0 0 0  ?PHQ - 2 Score 0 0 0  ? ? ?History ?Vernon Richard has a past medical history of Arthritis, Heart murmur, systolic, Hyperlipidemia, Hypertension, and Pulmonary fibrosis ([place]) (05/03/2018).  ? ?He has a past surgical history that includes Hernia repair; Cataract extraction, bilateral; Lung transplant, single (Right, 09/15/2018); Joint replacement (Bilateral); and Hip Arthroplasty (Right).  ? ?His family history includes Cancer in his  father and mother.He reports that he has never smoked. He has never used smokeless tobacco. He reports that he does not drink alcohol and does not use drugs. ? ? ? ?ROS ?Review of Systems  ?Constitutional:  Negative for fever.  ?HENT:    ?     Ears feel like a clock ticking. Right more than left. Intermittent for 6 months  ?Respiratory:  Negative for shortness of breath.   ?Cardiovascular:  Negative for chest pain.  ?Musculoskeletal:  Negative for arthralgias.  ?Skin:  Negative for rash.  ? ?Objective:  ?BP 123/69   Pulse 71   Temp (!) 97.3 ?F (36.3 ?C)   Ht '5\' 8"'  (1.727 m)   Wt 172 lb 12.8 oz (78.4 kg)   SpO2 98%   BMI 26.27 kg/m?  ? ?BP Readings from Last 3 Encounters:  ?02/05/22 123/69  ?08/07/21 116/66  ?07/06/20 (!) 148/68  ? ? ?Wt Readings from Last 3 Encounters:  ?02/05/22 172 lb 12.8 oz (78.4 kg)  ?08/07/21 169 lb 6.4 oz (76.8 kg)  ?08/01/21 165 lb (74.8 kg)  ? ? ? ?Physical Exam ?Vitals reviewed.  ?Constitutional:   ?   Appearance: He is well-developed.  ?HENT:  ?   Head: Normocephalic and atraumatic.  ?   Right Ear: External ear normal.  ?   Left Ear: External ear normal.  ?   Mouth/Throat:  ?   Pharynx: No oropharyngeal exudate or posterior oropharyngeal erythema.  ?Eyes:  ?   Pupils: Pupils are equal, round, and reactive to light.  ?Cardiovascular:  ?   Rate and  Rhythm: Normal rate and regular rhythm.  ?   Heart sounds: No murmur heard. ?Pulmonary:  ?   Effort: No respiratory distress.  ?   Breath sounds: Normal breath sounds.  ?Musculoskeletal:  ?   Cervical back: Normal range of motion and neck supple.  ?Neurological:  ?   Mental Status: He is alert and oriented to person, place, and time.  ? ? ? ? ?Assessment & Plan:  ? ?Kennedy was seen today for medical management of chronic issues. ? ?Diagnoses and all orders for this visit: ? ?Mixed hyperlipidemia ?-     Lipid panel ? ?Essential hypertension ?-     CBC with Differential/Platelet ?-     CMP14+EGFR ? ?Lung replaced by transplant ([place]) ?-     CMV  DNA, quantitative, PCR ?-     Tacrolimus level ? ?Other disorders of magnesium metabolism ?-     Magnesium ? ?Need for shingles vaccine ?-     Varicella-zoster vaccine IM (Shingrix) ? ? ? ? ? ? ?I have discontinued Kathlene November. Boehner's gabapentin. I am also having him maintain his aspirin, cetirizine, tacrolimus ER, MAGNESIUM GLYCINATE PO, acetaminophen, polyethylene glycol powder, Calcium Citrate-Vitamin D, Multi-Vitamin, fluticasone, Restasis, risedronate, Mycophenolate Mofetil (CELLCEPT PO), erythromycin, amLODipine, montelukast, pantoprazole, and pravastatin. ? ?Allergies as of 02/05/2022   ? ?   Reactions  ? Duloxetine Other (See Comments)  ? Made his arhritis worse.   ? Nsaids Other (See Comments)  ? Transplant  ? ?  ? ?  ?Medication List  ?  ? ?  ? Accurate as of February 05, 2022  3:12 PM. If you have any questions, ask your nurse or doctor.  ?  ?  ? ?  ? ?STOP taking these medications   ? ?gabapentin 300 MG capsule ?Commonly known as: NEURONTIN ?Stopped by: Claretta Fraise, MD ?  ? ?  ? ?TAKE these medications   ? ?acetaminophen 500 MG tablet ?Commonly known as: TYLENOL ?Take by mouth. ?  ?amLODipine 5 MG tablet ?Commonly known as: NORVASC ?Take 1 tablet (5 mg total) by mouth daily. ?  ?aspirin 81 MG tablet ?Take 81 mg by mouth daily. ?  ?Calcium Citrate-Vitamin D 200-250 MG-UNIT Tabs ?Take by mouth. ?  ?CELLCEPT PO ?Take by mouth. ?  ?cetirizine 10 MG tablet ?Commonly known as: ZYRTEC ?Take 10 mg by mouth daily. ?  ?erythromycin ophthalmic ointment ?at bedtime. ?  ?fluticasone 50 MCG/ACT nasal spray ?Commonly known as: FLONASE ?Place 2 sprays into both nostrils daily. ?  ?MAGNESIUM GLYCINATE PO ?Take 800 mg by mouth 2 (two) times daily. ?  ?montelukast 10 MG tablet ?Commonly known as: SINGULAIR ?Take 1 tablet (10 mg total) by mouth at bedtime. ?  ?Multi-Vitamin tablet ?Take by mouth. ?  ?pantoprazole 40 MG tablet ?Commonly known as: PROTONIX ?Take 1 tablet (40 mg total) by mouth daily. ?  ?polyethylene glycol powder  17 GM/SCOOP powder ?Commonly known as: GLYCOLAX/MIRALAX ?Take by mouth. ?  ?pravastatin 40 MG tablet ?Commonly known as: PRAVACHOL ?Take 1 tablet (40 mg total) by mouth daily. ?  ?Restasis 0.05 % ophthalmic emulsion ?Generic drug: cycloSPORINE ?Place 1 drop into both eyes 2 (two) times daily. ?  ?risedronate 150 MG tablet ?Commonly known as: Actonel ?Take 1 tablet (150 mg total) by mouth every 30 (thirty) days. with water, on an empty stomach, take nothing by mouth and to not lie down for 30 minutes after each dose. ?  ?tacrolimus ER 1 MG Tb24 ?Commonly known as: ENVARSUS XR ?08/23/19  Take three- 1 mg tabs daily (total of 3 mg). ?  ? ?  ? ?Fax LAB TO [phone] ? ?Follow-up: Return in about 6 months (around 08/07/2022). ? ?Claretta Fraise, M.D. ?

## 2022-02-06 DIAGNOSIS — Z86010 Personal history of colonic polyps: Secondary | ICD-10-CM | POA: Diagnosis not present

## 2022-02-06 DIAGNOSIS — J9611 Chronic respiratory failure with hypoxia: Secondary | ICD-10-CM | POA: Diagnosis not present

## 2022-02-06 DIAGNOSIS — Z7982 Long term (current) use of aspirin: Secondary | ICD-10-CM | POA: Diagnosis not present

## 2022-02-06 DIAGNOSIS — K219 Gastro-esophageal reflux disease without esophagitis: Secondary | ICD-10-CM | POA: Diagnosis not present

## 2022-02-06 DIAGNOSIS — Z9181 History of falling: Secondary | ICD-10-CM | POA: Diagnosis not present

## 2022-02-06 DIAGNOSIS — Z471 Aftercare following joint replacement surgery: Secondary | ICD-10-CM | POA: Diagnosis not present

## 2022-02-06 DIAGNOSIS — Z85828 Personal history of other malignant neoplasm of skin: Secondary | ICD-10-CM | POA: Diagnosis not present

## 2022-02-06 DIAGNOSIS — E785 Hyperlipidemia, unspecified: Secondary | ICD-10-CM | POA: Diagnosis not present

## 2022-02-06 DIAGNOSIS — I2510 Atherosclerotic heart disease of native coronary artery without angina pectoris: Secondary | ICD-10-CM | POA: Diagnosis not present

## 2022-02-06 DIAGNOSIS — Z96641 Presence of right artificial hip joint: Secondary | ICD-10-CM | POA: Diagnosis not present

## 2022-02-06 DIAGNOSIS — K5790 Diverticulosis of intestine, part unspecified, without perforation or abscess without bleeding: Secondary | ICD-10-CM | POA: Diagnosis not present

## 2022-02-06 DIAGNOSIS — J84112 Idiopathic pulmonary fibrosis: Secondary | ICD-10-CM | POA: Diagnosis not present

## 2022-02-06 DIAGNOSIS — Z7952 Long term (current) use of systemic steroids: Secondary | ICD-10-CM | POA: Diagnosis not present

## 2022-02-06 DIAGNOSIS — Z96653 Presence of artificial knee joint, bilateral: Secondary | ICD-10-CM | POA: Diagnosis not present

## 2022-02-06 DIAGNOSIS — Z8616 Personal history of COVID-19: Secondary | ICD-10-CM | POA: Diagnosis not present

## 2022-02-06 DIAGNOSIS — J8489 Other specified interstitial pulmonary diseases: Secondary | ICD-10-CM | POA: Diagnosis not present

## 2022-02-06 DIAGNOSIS — I10 Essential (primary) hypertension: Secondary | ICD-10-CM | POA: Diagnosis not present

## 2022-02-07 DIAGNOSIS — M25551 Pain in right hip: Secondary | ICD-10-CM | POA: Diagnosis not present

## 2022-02-07 LAB — CBC WITH DIFFERENTIAL/PLATELET
Basophils Absolute: 0.1 10*3/uL (ref 0.0–0.2)
Basos: 1 %
EOS (ABSOLUTE): 0.2 10*3/uL (ref 0.0–0.4)
Eos: 4 %
Hematocrit: 36.7 % — ABNORMAL LOW (ref 37.5–51.0)
Hemoglobin: 11.6 g/dL — ABNORMAL LOW (ref 13.0–17.7)
Immature Grans (Abs): 0.1 10*3/uL (ref 0.0–0.1)
Immature Granulocytes: 1 %
Lymphocytes Absolute: 0.8 10*3/uL (ref 0.7–3.1)
Lymphs: 13 %
MCH: 25.8 pg — ABNORMAL LOW (ref 26.6–33.0)
MCHC: 31.6 g/dL (ref 31.5–35.7)
MCV: 82 fL (ref 79–97)
Monocytes Absolute: 0.7 10*3/uL (ref 0.1–0.9)
Monocytes: 13 %
Neutrophils Absolute: 4 10*3/uL (ref 1.4–7.0)
Neutrophils: 68 %
Platelets: 306 10*3/uL (ref 150–450)
RBC: 4.49 x10E6/uL (ref 4.14–5.80)
RDW: 13.5 % (ref 11.6–15.4)
WBC: 5.8 10*3/uL (ref 3.4–10.8)

## 2022-02-07 LAB — LIPID PANEL
Chol/HDL Ratio: 3 ratio (ref 0.0–5.0)
Cholesterol, Total: 178 mg/dL (ref 100–199)
HDL: 60 mg/dL (ref >39)
LDL Chol Calc (NIH): 93 mg/dL (ref 0–99)
Triglycerides: 144 mg/dL (ref 0–149)
VLDL Cholesterol Cal: 25 mg/dL (ref 5–40)

## 2022-02-07 LAB — MAGNESIUM: Magnesium: 1.8 mg/dL (ref 1.6–2.3)

## 2022-02-07 LAB — CMP14+EGFR
ALT: 9 IU/L (ref 0–44)
AST: 14 IU/L (ref 0–40)
Albumin/Globulin Ratio: 2.3 — ABNORMAL HIGH (ref 1.2–2.2)
Albumin: 4.3 g/dL (ref 3.7–4.7)
Alkaline Phosphatase: 61 IU/L (ref 44–121)
BUN/Creatinine Ratio: 12 (ref 10–24)
BUN: 14 mg/dL (ref 8–27)
Bilirubin Total: 0.3 mg/dL (ref 0.0–1.2)
CO2: 24 mmol/L (ref 20–29)
Calcium: 9.4 mg/dL (ref 8.6–10.2)
Chloride: 105 mmol/L (ref 96–106)
Creatinine, Ser: 1.19 mg/dL (ref 0.76–1.27)
Globulin, Total: 1.9 g/dL (ref 1.5–4.5)
Glucose: 94 mg/dL (ref 70–99)
Potassium: 5.1 mmol/L (ref 3.5–5.2)
Sodium: 142 mmol/L (ref 134–144)
Total Protein: 6.2 g/dL (ref 6.0–8.5)
eGFR: 65 mL/min/{1.73_m2} (ref >59)

## 2022-02-07 LAB — CMV DNA, QUANTITATIVE, PCR: CMV DNA Quant: NEGATIVE IU/mL

## 2022-02-07 LAB — TACROLIMUS LEVEL: Tacrolimus (FK506), Blood: 9.3 ng/mL (ref 2.0–20.0)

## 2022-02-12 LAB — IRON AND TIBC
Iron Saturation: 20 % (ref 15–55)
Iron: 56 ug/dL (ref 38–169)
Total Iron Binding Capacity: 281 ug/dL (ref 250–450)
UIBC: 225 ug/dL (ref 111–343)

## 2022-02-12 LAB — SPECIMEN STATUS REPORT

## 2022-02-12 LAB — FERRITIN: Ferritin: 75 ng/mL (ref 30–400)

## 2022-02-20 DIAGNOSIS — Z7982 Long term (current) use of aspirin: Secondary | ICD-10-CM | POA: Diagnosis not present

## 2022-02-20 DIAGNOSIS — T86818 Other complications of lung transplant: Secondary | ICD-10-CM | POA: Diagnosis not present

## 2022-02-20 DIAGNOSIS — D801 Nonfamilial hypogammaglobulinemia: Secondary | ICD-10-CM | POA: Diagnosis not present

## 2022-02-20 DIAGNOSIS — Z7952 Long term (current) use of systemic steroids: Secondary | ICD-10-CM | POA: Diagnosis not present

## 2022-02-20 DIAGNOSIS — Z5181 Encounter for therapeutic drug level monitoring: Secondary | ICD-10-CM | POA: Diagnosis not present

## 2022-02-20 DIAGNOSIS — E1122 Type 2 diabetes mellitus with diabetic chronic kidney disease: Secondary | ICD-10-CM | POA: Diagnosis not present

## 2022-02-20 DIAGNOSIS — E785 Hyperlipidemia, unspecified: Secondary | ICD-10-CM | POA: Diagnosis not present

## 2022-02-20 DIAGNOSIS — Z7969 Long term (current) use of other immunomodulators and immunosuppressants: Secondary | ICD-10-CM | POA: Diagnosis not present

## 2022-02-20 DIAGNOSIS — Z942 Lung transplant status: Secondary | ICD-10-CM | POA: Diagnosis not present

## 2022-02-20 DIAGNOSIS — N182 Chronic kidney disease, stage 2 (mild): Secondary | ICD-10-CM | POA: Diagnosis not present

## 2022-02-20 DIAGNOSIS — T86819 Unspecified complication of lung transplant: Secondary | ICD-10-CM | POA: Diagnosis not present

## 2022-02-20 DIAGNOSIS — D84821 Immunodeficiency due to drugs: Secondary | ICD-10-CM | POA: Diagnosis not present

## 2022-02-20 DIAGNOSIS — I480 Paroxysmal atrial fibrillation: Secondary | ICD-10-CM | POA: Diagnosis not present

## 2022-02-20 DIAGNOSIS — J84112 Idiopathic pulmonary fibrosis: Secondary | ICD-10-CM | POA: Diagnosis not present

## 2022-02-20 DIAGNOSIS — Z4824 Encounter for aftercare following lung transplant: Secondary | ICD-10-CM | POA: Diagnosis not present

## 2022-02-20 DIAGNOSIS — I129 Hypertensive chronic kidney disease with stage 1 through stage 4 chronic kidney disease, or unspecified chronic kidney disease: Secondary | ICD-10-CM | POA: Diagnosis not present

## 2022-02-20 DIAGNOSIS — Z79899 Other long term (current) drug therapy: Secondary | ICD-10-CM | POA: Diagnosis not present

## 2022-03-07 DIAGNOSIS — Z942 Lung transplant status: Secondary | ICD-10-CM | POA: Diagnosis not present

## 2022-03-07 DIAGNOSIS — Z79899 Other long term (current) drug therapy: Secondary | ICD-10-CM | POA: Diagnosis not present

## 2022-03-07 DIAGNOSIS — T86818 Other complications of lung transplant: Secondary | ICD-10-CM | POA: Diagnosis not present

## 2022-03-07 DIAGNOSIS — D849 Immunodeficiency, unspecified: Secondary | ICD-10-CM | POA: Diagnosis not present

## 2022-03-07 DIAGNOSIS — Z5181 Encounter for therapeutic drug level monitoring: Secondary | ICD-10-CM | POA: Diagnosis not present

## 2022-03-28 DIAGNOSIS — T86818 Other complications of lung transplant: Secondary | ICD-10-CM | POA: Diagnosis not present

## 2022-03-28 DIAGNOSIS — D849 Immunodeficiency, unspecified: Secondary | ICD-10-CM | POA: Diagnosis not present

## 2022-03-28 DIAGNOSIS — Z79899 Other long term (current) drug therapy: Secondary | ICD-10-CM | POA: Diagnosis not present

## 2022-03-28 DIAGNOSIS — Z942 Lung transplant status: Secondary | ICD-10-CM | POA: Diagnosis not present

## 2022-03-28 DIAGNOSIS — Z5181 Encounter for therapeutic drug level monitoring: Secondary | ICD-10-CM | POA: Diagnosis not present

## 2022-04-04 DIAGNOSIS — Z942 Lung transplant status: Secondary | ICD-10-CM | POA: Diagnosis not present

## 2022-04-04 DIAGNOSIS — D849 Immunodeficiency, unspecified: Secondary | ICD-10-CM | POA: Diagnosis not present

## 2022-04-04 DIAGNOSIS — D801 Nonfamilial hypogammaglobulinemia: Secondary | ICD-10-CM | POA: Diagnosis not present

## 2022-04-24 DIAGNOSIS — L814 Other melanin hyperpigmentation: Secondary | ICD-10-CM | POA: Diagnosis not present

## 2022-04-24 DIAGNOSIS — L570 Actinic keratosis: Secondary | ICD-10-CM | POA: Diagnosis not present

## 2022-04-24 DIAGNOSIS — Z85828 Personal history of other malignant neoplasm of skin: Secondary | ICD-10-CM | POA: Diagnosis not present

## 2022-04-24 DIAGNOSIS — L579 Skin changes due to chronic exposure to nonionizing radiation, unspecified: Secondary | ICD-10-CM | POA: Diagnosis not present

## 2022-04-25 DIAGNOSIS — Z942 Lung transplant status: Secondary | ICD-10-CM | POA: Diagnosis not present

## 2022-04-25 DIAGNOSIS — T86818 Other complications of lung transplant: Secondary | ICD-10-CM | POA: Diagnosis not present

## 2022-04-25 DIAGNOSIS — D849 Immunodeficiency, unspecified: Secondary | ICD-10-CM | POA: Diagnosis not present

## 2022-04-25 DIAGNOSIS — Z79899 Other long term (current) drug therapy: Secondary | ICD-10-CM | POA: Diagnosis not present

## 2022-04-25 DIAGNOSIS — Z5181 Encounter for therapeutic drug level monitoring: Secondary | ICD-10-CM | POA: Diagnosis not present

## 2022-05-29 DIAGNOSIS — T86818 Other complications of lung transplant: Secondary | ICD-10-CM | POA: Diagnosis not present

## 2022-05-29 DIAGNOSIS — J8410 Pulmonary fibrosis, unspecified: Secondary | ICD-10-CM | POA: Diagnosis not present

## 2022-05-29 DIAGNOSIS — I480 Paroxysmal atrial fibrillation: Secondary | ICD-10-CM | POA: Diagnosis not present

## 2022-05-29 DIAGNOSIS — Z942 Lung transplant status: Secondary | ICD-10-CM | POA: Diagnosis not present

## 2022-05-29 DIAGNOSIS — T86810 Lung transplant rejection: Secondary | ICD-10-CM | POA: Diagnosis not present

## 2022-05-29 DIAGNOSIS — N1831 Chronic kidney disease, stage 3a: Secondary | ICD-10-CM | POA: Diagnosis not present

## 2022-05-29 DIAGNOSIS — Z5181 Encounter for therapeutic drug level monitoring: Secondary | ICD-10-CM | POA: Diagnosis not present

## 2022-05-29 DIAGNOSIS — Z79899 Other long term (current) drug therapy: Secondary | ICD-10-CM | POA: Diagnosis not present

## 2022-05-29 DIAGNOSIS — Z79624 Long term (current) use of inhibitors of nucleotide synthesis: Secondary | ICD-10-CM | POA: Diagnosis not present

## 2022-05-29 DIAGNOSIS — J84112 Idiopathic pulmonary fibrosis: Secondary | ICD-10-CM | POA: Diagnosis not present

## 2022-05-29 DIAGNOSIS — D84821 Immunodeficiency due to drugs: Secondary | ICD-10-CM | POA: Diagnosis not present

## 2022-05-29 DIAGNOSIS — Z7952 Long term (current) use of systemic steroids: Secondary | ICD-10-CM | POA: Diagnosis not present

## 2022-06-25 DIAGNOSIS — Z85828 Personal history of other malignant neoplasm of skin: Secondary | ICD-10-CM | POA: Diagnosis not present

## 2022-06-25 DIAGNOSIS — D485 Neoplasm of uncertain behavior of skin: Secondary | ICD-10-CM | POA: Diagnosis not present

## 2022-06-25 DIAGNOSIS — C44329 Squamous cell carcinoma of skin of other parts of face: Secondary | ICD-10-CM | POA: Diagnosis not present

## 2022-06-25 DIAGNOSIS — Z298 Encounter for other specified prophylactic measures: Secondary | ICD-10-CM | POA: Diagnosis not present

## 2022-06-25 DIAGNOSIS — D0439 Carcinoma in situ of skin of other parts of face: Secondary | ICD-10-CM | POA: Diagnosis not present

## 2022-06-25 DIAGNOSIS — C4442 Squamous cell carcinoma of skin of scalp and neck: Secondary | ICD-10-CM | POA: Diagnosis not present

## 2022-06-25 DIAGNOSIS — T86819 Unspecified complication of lung transplant: Secondary | ICD-10-CM | POA: Diagnosis not present

## 2022-06-25 DIAGNOSIS — L814 Other melanin hyperpigmentation: Secondary | ICD-10-CM | POA: Diagnosis not present

## 2022-06-25 DIAGNOSIS — L579 Skin changes due to chronic exposure to nonionizing radiation, unspecified: Secondary | ICD-10-CM | POA: Diagnosis not present

## 2022-06-25 DIAGNOSIS — Z79899 Other long term (current) drug therapy: Secondary | ICD-10-CM | POA: Diagnosis not present

## 2022-06-25 DIAGNOSIS — Z942 Lung transplant status: Secondary | ICD-10-CM | POA: Diagnosis not present

## 2022-06-25 DIAGNOSIS — L570 Actinic keratosis: Secondary | ICD-10-CM | POA: Diagnosis not present

## 2022-07-07 DIAGNOSIS — D801 Nonfamilial hypogammaglobulinemia: Secondary | ICD-10-CM | POA: Diagnosis not present

## 2022-07-07 DIAGNOSIS — D849 Immunodeficiency, unspecified: Secondary | ICD-10-CM | POA: Diagnosis not present

## 2022-07-07 DIAGNOSIS — Z942 Lung transplant status: Secondary | ICD-10-CM | POA: Diagnosis not present

## 2022-07-22 DIAGNOSIS — C44320 Squamous cell carcinoma of skin of unspecified parts of face: Secondary | ICD-10-CM | POA: Diagnosis not present

## 2022-07-22 DIAGNOSIS — L570 Actinic keratosis: Secondary | ICD-10-CM | POA: Diagnosis not present

## 2022-07-22 DIAGNOSIS — C44329 Squamous cell carcinoma of skin of other parts of face: Secondary | ICD-10-CM | POA: Diagnosis not present

## 2022-07-28 DIAGNOSIS — Z79899 Other long term (current) drug therapy: Secondary | ICD-10-CM | POA: Diagnosis not present

## 2022-07-28 DIAGNOSIS — Z942 Lung transplant status: Secondary | ICD-10-CM | POA: Diagnosis not present

## 2022-07-28 DIAGNOSIS — Z2989 Encounter for other specified prophylactic measures: Secondary | ICD-10-CM | POA: Diagnosis not present

## 2022-07-28 DIAGNOSIS — T86819 Unspecified complication of lung transplant: Secondary | ICD-10-CM | POA: Diagnosis not present

## 2022-07-31 ENCOUNTER — Other Ambulatory Visit: Payer: Self-pay | Admitting: Family Medicine

## 2022-07-31 DIAGNOSIS — E785 Hyperlipidemia, unspecified: Secondary | ICD-10-CM

## 2022-08-07 ENCOUNTER — Encounter: Payer: Self-pay | Admitting: Family Medicine

## 2022-08-07 ENCOUNTER — Ambulatory Visit (INDEPENDENT_AMBULATORY_CARE_PROVIDER_SITE_OTHER): Payer: Medicare Other | Admitting: Family Medicine

## 2022-08-07 VITALS — BP 120/68 | HR 71 | Temp 98.3°F | Ht 68.0 in | Wt 171.0 lb

## 2022-08-07 DIAGNOSIS — Z942 Lung transplant status: Secondary | ICD-10-CM | POA: Diagnosis not present

## 2022-08-07 DIAGNOSIS — E782 Mixed hyperlipidemia: Secondary | ICD-10-CM | POA: Diagnosis not present

## 2022-08-07 DIAGNOSIS — R7303 Prediabetes: Secondary | ICD-10-CM

## 2022-08-07 DIAGNOSIS — I2510 Atherosclerotic heart disease of native coronary artery without angina pectoris: Secondary | ICD-10-CM

## 2022-08-07 DIAGNOSIS — I10 Essential (primary) hypertension: Secondary | ICD-10-CM | POA: Diagnosis not present

## 2022-08-07 DIAGNOSIS — Z125 Encounter for screening for malignant neoplasm of prostate: Secondary | ICD-10-CM

## 2022-08-07 DIAGNOSIS — Z23 Encounter for immunization: Secondary | ICD-10-CM

## 2022-08-07 LAB — BAYER DCA HB A1C WAIVED: HB A1C (BAYER DCA - WAIVED): 5.6 % (ref 4.8–5.6)

## 2022-08-07 MED ORDER — DENOSUMAB 60 MG/ML ~~LOC~~ SOSY: 60.0000 mg | PREFILLED_SYRINGE | SUBCUTANEOUS | 0 refills | Status: DC

## 2022-08-07 MED ORDER — PANTOPRAZOLE SODIUM 40 MG PO TBEC: 40.0000 mg | DELAYED_RELEASE_TABLET | Freq: Every day | ORAL | 3 refills | Status: DC

## 2022-08-07 MED ORDER — AMLODIPINE BESYLATE 5 MG PO TABS: 5.0000 mg | ORAL_TABLET | Freq: Every day | ORAL | 3 refills | Status: DC

## 2022-08-07 NOTE — Progress Notes (Signed)
Subjective:  Patient ID: Vernon Richard, male    DOB: 25-Feb-1951  Age: 71 y.o. MRN: [id number]  CC: Medical Management of Chronic Issues   HPI TYHEEM BOUGHNER presents for follow up of his immunosuppression for lung transplant as well as monitoring BP & cholesterol. In addition to the listed med's, wife states he is getting IGIV Injections Q 3 months. Has taken prednisone at high doses,but now down to 5 mg daily. He is due for TDP and flu shots as well.    in for follow-up of elevated cholesterol. Doing well without complaints on current medication. Denies side effects of statin including myalgia and arthralgia and nausea. Currently no chest pain, shortness of breath or other cardiovascular related symptoms noted.  presents for  follow-up of hypertension. Patient has no history of headache chest pain or shortness of breath or recent cough. Patient also denies symptoms of TIA such as focal numbness or weakness. Patient denies side effects from medication. States taking it regularly.  PT. Could not tolerate risedronate, but continues to use steroids to prevent rejection of his lung transplant. He also has a report showing recent A1c is 5.9.     08/07/2022    8:13 AM 02/05/2022    8:00 AM 08/07/2021   10:31 AM  Depression screen PHQ 2/9  Decreased Interest 0 0 0  Down, Depressed, Hopeless 0 0 0  PHQ - 2 Score 0 0 0  Altered sleeping 3    Tired, decreased energy 1    Change in appetite 0    Feeling bad or failure about yourself  0    Trouble concentrating 0    Moving slowly or fidgety/restless 0    Suicidal thoughts 0    PHQ-9 Score 4    Difficult doing work/chores Not difficult at all      History Dylyn has a past medical history of Arthritis, Heart murmur, systolic, Hyperlipidemia, Hypertension, and Pulmonary fibrosis ([place]) (05/03/2018).   He has a past surgical history that includes Hernia repair; Cataract extraction, bilateral; Lung transplant, single (Right, 09/15/2018); Joint  replacement (Bilateral); and Hip Arthroplasty (Right).   His family history includes Cancer in his father and mother.He reports that he has never smoked. He has never used smokeless tobacco. He reports that he does not drink alcohol and does not use drugs.    ROS Review of Systems  Constitutional:  Negative for fever.  Respiratory:  Negative for shortness of breath.   Cardiovascular:  Negative for chest pain.  Musculoskeletal:  Negative for arthralgias.  Skin:  Negative for rash.    Objective:  BP 120/68   Pulse 71   Temp 98.3 F (36.8 C)   Ht _0  (1.727 m)   Wt 171 lb (77.6 kg)   SpO2 97%   BMI 26.00 kg/m   BP Readings from Last 3 Encounters:  08/07/22 120/68  02/05/22 123/69  08/07/21 116/66    Wt Readings from Last 3 Encounters:  08/07/22 171 lb (77.6 kg)  02/05/22 172 lb 12.8 oz (78.4 kg)  08/07/21 169 lb 6.4 oz (76.8 kg)     Physical Exam Vitals reviewed.  Constitutional:      Appearance: He is well-developed.  HENT:     Head: Normocephalic and atraumatic.     Right Ear: External ear normal.     Left Ear: External ear normal.     Mouth/Throat:     Pharynx: No oropharyngeal exudate or posterior oropharyngeal erythema.  Eyes:  Pupils: Pupils are equal, round, and reactive to light.  Cardiovascular:     Rate and Rhythm: Normal rate and regular rhythm.     Heart sounds: No murmur heard. Pulmonary:     Effort: No respiratory distress.     Breath sounds: Normal breath sounds.  Musculoskeletal:     Cervical back: Normal range of motion and neck supple.  Neurological:     Mental Status: He is alert and oriented to person, place, and time.       Assessment & Plan:   Aarion was seen today for medical management of chronic issues.  Diagnoses and all orders for this visit:  Prediabetes -     Bayer DCA Hb A1c Waived  Mixed hyperlipidemia -     Lipid panel  Essential hypertension -     CBC with Differential/Platelet -     CMP14+EGFR -      amLODipine (NORVASC) 5 MG tablet; Take 1 tablet (5 mg total) by mouth daily.  Coronary artery disease involving native heart without angina pectoris, unspecified vessel or lesion type  Status post lung transplantation ([place])  Screening for prostate cancer -     PSA, total and free  Other orders -     pantoprazole (PROTONIX) 40 MG tablet; Take 1 tablet (40 mg total) by mouth daily. -     denosumab (PROLIA) 60 MG/ML SOSY injection; Inject 60 mg into the skin every 6 (six) months.       I have discontinued Kathlene November. Giebel's Restasis, risedronate, erythromycin, and montelukast. I am also having him start on denosumab. Additionally, I am having him maintain his aspirin, cetirizine, tacrolimus ER, MAGNESIUM GLYCINATE PO, acetaminophen, polyethylene glycol powder, Calcium Citrate-Vitamin D, Multi-Vitamin, fluticasone, Mycophenolate Mofetil (CELLCEPT PO), pravastatin, amLODipine, pantoprazole, and predniSONE.  Allergies as of 08/07/2022       Reactions   Duloxetine Other (See Comments)   Made his arhritis worse.    Nsaids Other (See Comments)   Transplant        Medication List        Accurate as of August 07, 2022  9:03 AM. If you have any questions, ask your nurse or doctor.          STOP taking these medications    erythromycin ophthalmic ointment Stopped by: Claretta Fraise, MD   montelukast 10 MG tablet Commonly known as: SINGULAIR Stopped by: Claretta Fraise, MD   Restasis 0.05 % ophthalmic emulsion Generic drug: cycloSPORINE Stopped by: Claretta Fraise, MD   risedronate 150 MG tablet Commonly known as: Actonel Stopped by: Claretta Fraise, MD       TAKE these medications    acetaminophen 500 MG tablet Commonly known as: TYLENOL Take by mouth.   amLODipine 5 MG tablet Commonly known as: NORVASC Take 1 tablet (5 mg total) by mouth daily.   aspirin 81 MG tablet Take 81 mg by mouth daily.   Calcium Citrate-Vitamin D 200-250 MG-UNIT Tabs Take by mouth.    CELLCEPT PO Take by mouth.   cetirizine 10 MG tablet Commonly known as: ZYRTEC Take 10 mg by mouth daily.   denosumab 60 MG/ML Sosy injection Commonly known as: PROLIA Inject 60 mg into the skin every 6 (six) months. Started by: Claretta Fraise, MD   fluticasone 50 MCG/ACT nasal spray Commonly known as: FLONASE Place 2 sprays into both nostrils daily.   MAGNESIUM GLYCINATE PO Take 800 mg by mouth 2 (two) times daily.   Multi-Vitamin tablet Take by mouth.  pantoprazole 40 MG tablet Commonly known as: PROTONIX Take 1 tablet (40 mg total) by mouth daily.   polyethylene glycol powder 17 GM/SCOOP powder Commonly known as: GLYCOLAX/MIRALAX Take by mouth.   pravastatin 40 MG tablet Commonly known as: PRAVACHOL TAKE 1 TABLET BY MOUTH EVERY DAY   predniSONE 5 MG tablet Commonly known as: DELTASONE Take 5 mg by mouth daily with breakfast.   tacrolimus ER 1 MG Tb24 Commonly known as: ENVARSUS XR 08/23/19 Take three- 1 mg tabs daily (total of 3 mg).        50 minutes spent in care of pt. More than 1/2 in discussion of prevention of diabetes through dietary modification and exercise.  Follow-up: No follow-ups on file.  Claretta Fraise, M.D.

## 2022-08-08 LAB — CMP14+EGFR
ALT: 13 IU/L (ref 0–44)
AST: 18 IU/L (ref 0–40)
Albumin/Globulin Ratio: 2 (ref 1.2–2.2)
Albumin: 4.2 g/dL (ref 3.8–4.8)
Alkaline Phosphatase: 45 IU/L (ref 44–121)
BUN/Creatinine Ratio: 14 (ref 10–24)
BUN: 19 mg/dL (ref 8–27)
Bilirubin Total: 0.4 mg/dL (ref 0.0–1.2)
CO2: 26 mmol/L (ref 20–29)
Calcium: 9.1 mg/dL (ref 8.6–10.2)
Chloride: 104 mmol/L (ref 96–106)
Creatinine, Ser: 1.36 mg/dL — ABNORMAL HIGH (ref 0.76–1.27)
Globulin, Total: 2.1 g/dL (ref 1.5–4.5)
Glucose: 95 mg/dL (ref 70–99)
Potassium: 4.4 mmol/L (ref 3.5–5.2)
Sodium: 142 mmol/L (ref 134–144)
Total Protein: 6.3 g/dL (ref 6.0–8.5)
eGFR: 55 mL/min/{1.73_m2} — ABNORMAL LOW (ref >59)

## 2022-08-08 LAB — CBC WITH DIFFERENTIAL/PLATELET
Basophils Absolute: 0.1 10*3/uL (ref 0.0–0.2)
Basos: 2 %
EOS (ABSOLUTE): 0.2 10*3/uL (ref 0.0–0.4)
Eos: 3 %
Hematocrit: 39.3 % (ref 37.5–51.0)
Hemoglobin: 12.4 g/dL — ABNORMAL LOW (ref 13.0–17.7)
Immature Grans (Abs): 0.1 10*3/uL (ref 0.0–0.1)
Immature Granulocytes: 2 %
Lymphocytes Absolute: 0.8 10*3/uL (ref 0.7–3.1)
Lymphs: 14 %
MCH: 26 pg — ABNORMAL LOW (ref 26.6–33.0)
MCHC: 31.6 g/dL (ref 31.5–35.7)
MCV: 82 fL (ref 79–97)
Monocytes Absolute: 0.8 10*3/uL (ref 0.1–0.9)
Monocytes: 14 %
Neutrophils Absolute: 3.6 10*3/uL (ref 1.4–7.0)
Neutrophils: 65 %
Platelets: 245 10*3/uL (ref 150–450)
RBC: 4.77 x10E6/uL (ref 4.14–5.80)
RDW: 15.2 % (ref 11.6–15.4)
WBC: 5.5 10*3/uL (ref 3.4–10.8)

## 2022-08-08 LAB — LIPID PANEL
Chol/HDL Ratio: 3.4 ratio (ref 0.0–5.0)
Cholesterol, Total: 199 mg/dL (ref 100–199)
HDL: 59 mg/dL (ref >39)
LDL Chol Calc (NIH): 112 mg/dL — ABNORMAL HIGH (ref 0–99)
Triglycerides: 161 mg/dL — ABNORMAL HIGH (ref 0–149)
VLDL Cholesterol Cal: 28 mg/dL (ref 5–40)

## 2022-08-08 LAB — PSA, TOTAL AND FREE
PSA, Free Pct: 50 %
PSA, Free: 0.2 ng/mL
Prostate Specific Ag, Serum: 0.4 ng/mL (ref 0.0–4.0)

## 2022-08-08 NOTE — Addendum Note (Signed)
Addended by: Geryl Rankins D on: 08/08/2022 08:00 AM   Modules accepted: Orders

## 2022-08-09 ENCOUNTER — Other Ambulatory Visit: Payer: Self-pay | Admitting: Family Medicine

## 2022-08-09 MED ORDER — ROSUVASTATIN CALCIUM 20 MG PO TABS: 20.0000 mg | ORAL_TABLET | Freq: Every day | ORAL | 3 refills | Status: DC

## 2022-08-13 DIAGNOSIS — C44329 Squamous cell carcinoma of skin of other parts of face: Secondary | ICD-10-CM | POA: Diagnosis not present

## 2022-08-18 DIAGNOSIS — T86819 Unspecified complication of lung transplant: Secondary | ICD-10-CM | POA: Diagnosis not present

## 2022-08-18 DIAGNOSIS — Z2989 Encounter for other specified prophylactic measures: Secondary | ICD-10-CM | POA: Diagnosis not present

## 2022-08-18 DIAGNOSIS — Z79899 Other long term (current) drug therapy: Secondary | ICD-10-CM | POA: Diagnosis not present

## 2022-08-18 DIAGNOSIS — Z942 Lung transplant status: Secondary | ICD-10-CM | POA: Diagnosis not present

## 2022-08-29 ENCOUNTER — Ambulatory Visit (INDEPENDENT_AMBULATORY_CARE_PROVIDER_SITE_OTHER): Payer: Medicare Other

## 2022-08-29 VITALS — Ht 68.0 in | Wt 168.0 lb

## 2022-08-29 DIAGNOSIS — Z0000 Encounter for general adult medical examination without abnormal findings: Secondary | ICD-10-CM

## 2022-08-29 NOTE — Patient Instructions (Signed)
Vernon Richard , Thank you for taking time to come for your Medicare Wellness Visit. I appreciate your ongoing commitment to your health goals. Please review the following plan we discussed and let me know if I can assist you in the future.   These are the goals we discussed:  Goals      DIET - EAT MORE FRUITS AND VEGETABLES     Have 3 meals a day        This is a list of the screening recommended for you and due dates:  Health Maintenance  Topic Date Due   COVID-19 Vaccine (4 - Moderna risk series) 09/06/2020   Medicare Annual Wellness Visit  08/30/2023   Colon Cancer Screening  10/04/2023   Tetanus Vaccine  08/07/2032   Pneumonia Vaccine  Completed   Flu Shot  Completed   Hepatitis C Screening: USPSTF Recommendation to screen - Ages 18-79 yo.  Completed   Zoster (Shingles) Vaccine  Completed   HPV Vaccine  Aged Out    Advanced directives: Advance directive discussed with you today. I have provided a copy for you to complete at home and have notarized. Once this is complete please bring a copy in to our office so we can scan it into your chart.   Conditions/risks identified: Aim for 30 minutes of exercise or brisk walking, 6-8 glasses of water, and 5 servings of fruits and vegetables each day.   Next appointment: Follow up in one year for your annual wellness visit.   Preventive Care 17 Years and Older, Male  Preventive care refers to lifestyle choices and visits with your health care provider that can promote health and wellness. What does preventive care include? A yearly physical exam. This is also called an annual well check. Dental exams once or twice a year. Routine eye exams. Ask your health care provider how often you should have your eyes checked. Personal lifestyle choices, including: Daily care of your teeth and gums. Regular physical activity. Eating a healthy diet. Avoiding tobacco and drug use. Limiting alcohol use. Practicing safe sex. Taking low doses of  aspirin every day. Taking vitamin and mineral supplements as recommended by your health care provider. What happens during an annual well check? The services and screenings done by your health care provider during your annual well check will depend on your age, overall health, lifestyle risk factors, and family history of disease. Counseling  Your health care provider may ask you questions about your: Alcohol use. Tobacco use. Drug use. Emotional well-being. Home and relationship well-being. Sexual activity. Eating habits. History of falls. Memory and ability to understand (cognition). Work and work Statistician. Screening  You may have the following tests or measurements: Height, weight, and BMI. Blood pressure. Lipid and cholesterol levels. These may be checked every 5 years, or more frequently if you are over 51 years old. Skin check. Lung cancer screening. You may have this screening every year starting at age 47 if you have a 30-pack-year history of smoking and currently smoke or have quit within the past 15 years. Fecal occult blood test (FOBT) of the stool. You may have this test every year starting at age 81. Flexible sigmoidoscopy or colonoscopy. You may have a sigmoidoscopy every 5 years or a colonoscopy every 10 years starting at age 40. Prostate cancer screening. Recommendations will vary depending on your family history and other risks. Hepatitis C blood test. Hepatitis B blood test. Sexually transmitted disease (STD) testing. Diabetes screening. This is done by  checking your blood sugar (glucose) after you have not eaten for a while (fasting). You may have this done every 1-3 years. Abdominal aortic aneurysm (AAA) screening. You may need this if you are a current or former smoker. Osteoporosis. You may be screened starting at age 50 if you are at high risk. Talk with your health care provider about your test results, treatment options, and if necessary, the need for more  tests. Vaccines  Your health care provider may recommend certain vaccines, such as: Influenza vaccine. This is recommended every year. Tetanus, diphtheria, and acellular pertussis (Tdap, Td) vaccine. You may need a Td booster every 10 years. Zoster vaccine. You may need this after age 56. Pneumococcal 13-valent conjugate (PCV13) vaccine. One dose is recommended after age 50. Pneumococcal polysaccharide (PPSV23) vaccine. One dose is recommended after age 67. Talk to your health care provider about which screenings and vaccines you need and how often you need them. This information is not intended to replace advice given to you by your health care provider. Make sure you discuss any questions you have with your health care provider. Document Released: 11/02/2015 Document Revised: 06/25/2016 Document Reviewed: 08/07/2015 Elsevier Interactive Patient Education  2017 [place] Prevention in the Home Falls can cause injuries. They can happen to people of all ages. There are many things you can do to make your home safe and to help prevent falls. What can I do on the outside of my home? Regularly fix the edges of walkways and driveways and fix any cracks. Remove anything that might make you trip as you walk through a door, such as a raised step or threshold. Trim any bushes or trees on the path to your home. Use bright outdoor lighting. Clear any walking paths of anything that might make someone trip, such as rocks or tools. Regularly check to see if handrails are loose or broken. Make sure that both sides of any steps have handrails. Any raised decks and porches should have guardrails on the edges. Have any leaves, snow, or ice cleared regularly. Use sand or salt on walking paths during winter. Clean up any spills in your garage right away. This includes oil or grease spills. What can I do in the bathroom? Use night lights. Install grab bars by the toilet and in the tub and shower.  Do not use towel bars as grab bars. Use non-skid mats or decals in the tub or shower. If you need to sit down in the shower, use a plastic, non-slip stool. Keep the floor dry. Clean up any water that spills on the floor as soon as it happens. Remove soap buildup in the tub or shower regularly. Attach bath mats securely with double-sided non-slip rug tape. Do not have throw rugs and other things on the floor that can make you trip. What can I do in the bedroom? Use night lights. Make sure that you have a light by your bed that is easy to reach. Do not use any sheets or blankets that are too big for your bed. They should not hang down onto the floor. Have a firm chair that has side arms. You can use this for support while you get dressed. Do not have throw rugs and other things on the floor that can make you trip. What can I do in the kitchen? Clean up any spills right away. Avoid walking on wet floors. Keep items that you use a lot in easy-to-reach places. If you need to reach  something above you, use a strong step stool that has a grab bar. Keep electrical cords out of the way. Do not use floor polish or wax that makes floors slippery. If you must use wax, use non-skid floor wax. Do not have throw rugs and other things on the floor that can make you trip. What can I do with my stairs? Do not leave any items on the stairs. Make sure that there are handrails on both sides of the stairs and use them. Fix handrails that are broken or loose. Make sure that handrails are as long as the stairways. Check any carpeting to make sure that it is firmly attached to the stairs. Fix any carpet that is loose or worn. Avoid having throw rugs at the top or bottom of the stairs. If you do have throw rugs, attach them to the floor with carpet tape. Make sure that you have a light switch at the top of the stairs and the bottom of the stairs. If you do not have them, ask someone to add them for you. What else  can I do to help prevent falls? Wear shoes that: Do not have high heels. Have rubber bottoms. Are comfortable and fit you well. Are closed at the toe. Do not wear sandals. If you use a stepladder: Make sure that it is fully opened. Do not climb a closed stepladder. Make sure that both sides of the stepladder are locked into place. Ask someone to hold it for you, if possible. Clearly mark and make sure that you can see: Any grab bars or handrails. First and last steps. Where the edge of each step is. Use tools that help you move around (mobility aids) if they are needed. These include: Canes. Walkers. Scooters. Crutches. Turn on the lights when you go into a dark area. Replace any light bulbs as soon as they burn out. Set up your furniture so you have a clear path. Avoid moving your furniture around. If any of your floors are uneven, fix them. If there are any pets around you, be aware of where they are. Review your medicines with your doctor. Some medicines can make you feel dizzy. This can increase your chance of falling. Ask your doctor what other things that you can do to help prevent falls. This information is not intended to replace advice given to you by your health care provider. Make sure you discuss any questions you have with your health care provider. Document Released: 08/02/2009 Document Revised: 03/13/2016 Document Reviewed: 11/10/2014 Elsevier Interactive Patient Education  2017 [employer].

## 2022-08-29 NOTE — Progress Notes (Signed)
Subjective:   Vernon Richard is a 71 y.o. male who presents for Medicare Annual/Subsequent preventive examination.   I connected with  Vernon Richard on 08/29/22 by a audio enabled telemedicine application and verified that I am speaking with the correct person using two identifiers.  Patient Location: Home  Provider Location: Home Office  I discussed the limitations of evaluation and management by telemedicine. The patient expressed understanding and agreed to proceed.  Review of Systems     Cardiac Risk Factors include: advanced age (>64mn, >>52women);hypertension;male gender     Objective:    Today's Vitals   08/29/22 1031  Weight: 168 lb (76.2 kg)  Height: '5\' 8"'$  (1.727 m)   Body mass index is 25.54 kg/m.     08/29/2022   10:34 AM 08/01/2021    3:33 PM 03/26/2021    9:30 AM 05/27/2018   11:32 AM  Advanced Directives  Does Patient Have a Medical Advance Directive? No Yes Yes Yes  Type of ACorporate treasurerof A[place]th[place]Living will  [place] will  Copy of HLyndonin Chart?  No - copy requested  No - copy requested  Would patient like information on creating a medical advance directive? No - Patient declined       Current Medications (verified) Outpatient Encounter Medications as of 08/29/2022  Medication Sig   acetaminophen (TYLENOL) 500 MG tablet Take by mouth.   amLODipine (NORVASC) 5 MG tablet Take 1 tablet (5 mg total) by mouth daily.   aspirin 81 MG tablet Take 81 mg by mouth daily.   Calcium Citrate-Vitamin D 200-250 MG-UNIT TABS Take by mouth.   cetirizine (ZYRTEC) 10 MG tablet Take 10 mg by mouth daily.   denosumab (PROLIA) 60 MG/ML SOSY injection Inject 60 mg into the skin every 6 (six) months.   fluticasone (FLONASE) 50 MCG/ACT nasal spray Place 2 sprays into both nostrils daily.   MAGNESIUM GLYCINATE PO Take 800 mg by mouth 2 (two) times daily.   Multiple Vitamin (MULTI-VITAMIN) tablet Take by  mouth.   Mycophenolate Mofetil (CELLCEPT PO) Take by mouth.   pantoprazole (PROTONIX) 40 MG tablet Take 1 tablet (40 mg total) by mouth daily.   polyethylene glycol powder (GLYCOLAX/MIRALAX) 17 GM/SCOOP powder Take by mouth.   predniSONE (DELTASONE) 5 MG tablet Take 5 mg by mouth daily with breakfast.   rosuvastatin (CRESTOR) 20 MG tablet Take 1 tablet (20 mg total) by mouth daily. For cholesterol   Tacrolimus ER 1 MG TB24 08/23/19 Take three- 1 mg tabs daily (total of 3 mg).   No facility-administered encounter medications on file as of 08/29/2022.    Allergies (verified) Duloxetine and Nsaids   History: Past Medical History:  Diagnosis Date   Arthritis    OA of both knees   Heart murmur, systolic    Hyperlipidemia    Hypertension    [place]ulmonary fibrosis ([place] 05/03[place]   Past Surgical History:  Procedure Laterality Date   CATARACT EXTRACTION, BILATERAL     HERNIA REPAIR     groin   HIP ARTHROPLASTY Right    JOINT REPLACEMENT Bilateral    knees   LUNG TRANSPLANT, SINGLE Right 09/15/2018   Family History  Problem Relation Age of Onset   Cancer Mother    Cancer Father    Social History   Socioeconomic History   Marital status: Married    Spouse name: Not on file   Number of children: 1  Years of education: Not on file   Highest education level: 12th grade  Occupational History   Occupation: Farm  Tobacco Use   Smoking status: Never   Smokeless tobacco: Never  Vaping Use   Vaping Use: Never used  Substance and Sexual Activity   Alcohol use: No   Drug use: No   Sexual activity: Not Currently  Other Topics Concern   Not on file  Social History Narrative   Married 1 child   S/p lung transplant   Social Determinants of Health   Financial Resource Strain: Low Risk  (08/29/2022)   Overall Financial Resource Strain (CARDIA)    Difficulty of Paying Living Expenses: Not hard at all  Food Insecurity: No Food Insecurity (08/29/2022)   Hunger Vital Sign     Worried About Running Out of Food in the Last Year: Never true    Ran Out of Food in the Last Year: Never true  Transportation Needs: No Transportation Needs (08/29/2022)   PRAPARE - Hydrologist (Medical): No    Lack of Transportation (Non-Medical): No  Physical Activity: Sufficiently Active (08/29/2022)   Exercise Vital Sign    Days of Exercise per Week: 5 days    Minutes of Exercise per Session: 30 min  Stress: No Stress Concern Present (08/29/2022)   [place]    Feeling of Stress : Not at all  Social Connections: [place] (08/29/2022)   Social Connection and Isolation Panel [NHANES]    Frequency of Communication with Friends and Family: More than three times a week    Frequency of Social Gatherings with Friends and Family: More than three times a week    Attends Religious Services: More than 4 times per year    Active Member of [employer] or Organizations: Yes    Attends Music therapist: More than 4 times per year    Marital Status: Married    Tobacco Counseling Counseling given: Not Answered   Clinical Intake:  Pre-visit preparation completed: Yes  Pain : No/denies pain     Nutritional Risks: None Diabetes: No  How often do you need to have someone help you when you read instructions, pamphlets, or other written materials from your doctor or pharmacy?: 1 - Never  Diabetic?no   Interpreter Needed?: No  Information entered by :: Jadene Pierini, LPN   Activities of Daily Living    08/29/2022   10:34 AM  In your present state of health, do you have any difficulty performing the following activities:  Hearing? 0  Vision? 0  Difficulty concentrating or making decisions? 0  Walking or climbing stairs? 0  Dressing or bathing? 0  Doing errands, shopping? 0  Preparing Food and eating ? N  Using the Toilet? N  In the past six months, have you  accidently leaked urine? N  Do you have problems with loss of bowel control? N  Managing your Medications? N  Managing your Finances? N  Housekeeping or managing your Housekeeping? N    Patient Care Team: Claretta Fraise, MD as PCP - General (Family Medicine)  Indicate any recent Medical Services you may have received from other than [facility] providers in the past year (date may be approximate).     Assessment:   This is a routine wellness examination for Emani.  Hearing/Vision screen Vision Screening - Comments:: Annual eye exams   Dietary issues and exercise activities discussed: Current Exercise Habits: Home exercise  routine, Type of exercise: walking, Time (Minutes): 30, Frequency (Times/Week): 5, Weekly Exercise (Minutes/Week): 150, Intensity: Mild, Exercise limited by: None identified   Goals Addressed             This Visit's Progress    DIET - EAT MORE FRUITS AND VEGETABLES   On track      Depression Screen    08/29/2022   10:33 AM 08/07/2022    8:13 AM 02/05/2022    8:00 AM 08/07/2021   10:31 AM 08/01/2021    3:34 PM 07/06/2020    8:13 AM 10/11/2019    2:31 PM  PHQ 2/9 Scores  PHQ - 2 Score 0 0 0 0 0 0 0  PHQ- 9 Score 0 4         Fall Risk    08/29/2022   10:31 AM 02/05/2022    8:00 AM 08/07/2021   10:31 AM 08/01/2021    3:39 PM 07/06/2020    8:13 AM  Fall Risk   Falls in the past year? 0 0 0 0 0  Number falls in past yr: 0   0   Injury with Fall? 0   0   Risk for fall due to : No Fall Risks   Orthopedic patient   Follow up Falls prevention discussed   Falls prevention discussed Falls evaluation completed    [place]:  Any stairs in or around the home? Yes  If so, are there any without handrails? No  Home free of loose throw rugs in walkways, pet beds, electrical cords, etc? Yes  Adequate lighting in your home to reduce risk of falls? Yes   ASSISTIVE DEVICES UTILIZED TO PREVENT FALLS:  Life alert? No  Use of a  cane, walker or w/c? No  Grab bars in the bathroom? Yes  Shower chair or bench in shower? Yes  Elevated toilet seat or a handicapped toilet? Yes       05/27/2018   11:33 AM  MMSE - Mini Mental State Exam  Orientation to time 5  Orientation to Place 5  Registration 3  Attention/ Calculation 5  Recall 3  Language- name 2 objects 2  Language- repeat 1  Language- follow 3 step command 3  Language- read & follow direction 1  Write a sentence 1  Copy design 1  Total score 30        08/29/2022   10:34 AM 08/01/2021    3:40 PM  6CIT Screen  What Year? 0 points 0 points  What month? 0 points 0 points  What time? 0 points 0 points  Count back from 20 0 points 0 points  Months in reverse 0 points 0 points  Repeat phrase 0 points 0 points  Total Score 0 points 0 points    Immunizations Immunization History  Administered Date(s) Administered   Fluad Quad(high Dose 65+) 08/07/2021, 08/07/2022   Hepatitis B, adult 05/06/2018, 06/03/2018, 08/17/2018   Influenza, High Dose Seasonal PF 09/21/2017, 08/03/2018, 07/21/2019   Influenza, Seasonal, Injecte, Preservative Fre 07/19/2013, 09/13/2014, 09/18/2015, 09/17/2016   Influenza,inj,Quad PF,6+ Mos 07/19/2013, 09/13/2014, 09/18/2015, 09/17/2016   Moderna Sars-Covid-2 Vaccination 11/12/2019, 03/27/2020, 07/12/2020   Pneumococcal Conjugate-13 03/17/2017   Pneumococcal Polysaccharide-23 05/06/2018   Tdap 07/26/2012, 08/07/2022   Zoster Recombinat (Shingrix) 06/03/2018, 02/05/2022    TDAP status: Up to date  Flu Vaccine status: Up to date  Pneumococcal vaccine status: Up to date  Covid-19 vaccine status: Completed vaccines  Qualifies for Shingles Vaccine?  Yes   Zostavax completed Yes   Shingrix Completed?: Yes  Screening Tests Health Maintenance  Topic Date Due   COVID-19 Vaccine (4 - Moderna risk series) 09/06/2020   Medicare Annual Wellness (AWV)  08/30/2023   COLONOSCOPY (Pts 45-74yr Insurance coverage will need to be  confirmed)  10/04/2023   TETANUS/TDAP  08/07/2032   Pneumonia Vaccine 72 Years old  Completed   INFLUENZA VACCINE  Completed   Hepatitis C Screening  Completed   Zoster Vaccines- Shingrix  Completed   HPV VACCINES  Aged Out    Health Maintenance  Health Maintenance Due  Topic Date Due   COVID-19 Vaccine (4 - Moderna risk series) 09/06/2020    Colorectal cancer screening: Type of screening: Colonoscopy. Completed 10/03/2018. Repeat every 10 years  Lung Cancer Screening: (Low Dose CT Chest recommended if Age 72-80years, 30 pack-year currently smoking OR have quit w/in 15years.) does not qualify.   Lung Cancer Screening Referral: n/a  Additional Screening:  Hepatitis C Screening: does not qualify;  Vision Screening: Recommended annual ophthalmology exams for early detection of glaucoma and other disorders of the eye. Is the patient up to date with their annual eye exam?  Yes  Who is the provider or what is the name of the [facility] which the patient [facility] If pt is not established with a provider, would they like to be referred to a provider to establish care? No .   Dental Screening: Recommended annual dental exams for proper oral hygiene  Community Resource Referral / Chronic Care Management: CRR required this visit?  No   CCM required this visit?  No      Plan:     I have personally reviewed and noted the following in the patient's chart:   Medical and social history Use of alcohol, tobacco or illicit drugs  Current medications and supplements including opioid prescriptions. Patient is not currently taking opioid prescriptions. Functional ability and status Nutritional status Physical activity Advanced directives List of other physicians Hospitalizations, surgeries, and ER visits in previous 12 months Vitals Screenings to include cognitive, depression, and falls Referrals and appointments  In addition, I have reviewed and  discussed with patient certain preventive protocols, quality metrics, and best practice recommendations. A written personalized care plan for preventive services as well as general preventive health recommendations were provided to patient.     LDaphane Shepherd LPN   [id number]  Nurse Notes: none

## 2022-09-01 DIAGNOSIS — Z942 Lung transplant status: Secondary | ICD-10-CM | POA: Diagnosis not present

## 2022-09-01 DIAGNOSIS — Z2989 Encounter for other specified prophylactic measures: Secondary | ICD-10-CM | POA: Diagnosis not present

## 2022-09-01 DIAGNOSIS — T86819 Unspecified complication of lung transplant: Secondary | ICD-10-CM | POA: Diagnosis not present

## 2022-09-01 DIAGNOSIS — Z79899 Other long term (current) drug therapy: Secondary | ICD-10-CM | POA: Diagnosis not present

## 2022-09-01 NOTE — Progress Notes (Signed)
Subjective:   Vernon Richard is a 71 y.o. male who presents for Medicare Annual/Subsequent preventive examination.   I connected with  [name] on 09/01/22 by a audio enabled telemedicine application and verified that I am speaking with the correct person using two identifiers.  Patient Location: Home  Provider Location: Home Office  I discussed the limitations of evaluation and management by telemedicine. The patient expressed understanding and agreed to proceed.  Review of Systems     Cardiac Risk Factors include: advanced age (>73mn, >>52women);hypertension;male gender     Objective:    Today's Vitals   08/29/22 1031  Weight: 168 lb (76.2 kg)  Height: '5\' 8"'$  (1.727 m)   Body mass index is 25.54 kg/m.     08/29/2022   10:34 AM 08/01/2021    3:33 PM 03/26/2021    9:30 AM 05/27/2018   11:32 AM  Advanced Directives  Does Patient Have a Medical Advance Directive? No Yes Yes Yes  Type of ACorporate treasurerof AAmesLiving will  [place]Living will  Copy of [place] Chart?  No - copy requested  No - copy requested  Would patient like information on creating a medical advance directive? No - Patient declined       Current Medications (verified) Outpatient Encounter Medications as of 08/29/2022  Medication Sig   acetaminophen (TYLENOL) 500 MG tablet Take by mouth.   amLODipine (NORVASC) 5 MG tablet Take 1 tablet (5 mg total) by mouth daily.   aspirin 81 MG tablet Take 81 mg by mouth daily.   Calcium Citrate-Vitamin D 200-250 MG-UNIT TABS Take by mouth.   cetirizine (ZYRTEC) 10 MG tablet Take 10 mg by mouth daily.   denosumab (PROLIA) 60 MG/ML SOSY injection Inject 60 mg into the skin every 6 (six) months.   fluticasone (FLONASE) 50 MCG/ACT nasal spray Place 2 sprays into both nostrils daily.   MAGNESIUM GLYCINATE PO Take 800 mg by mouth 2 (two) times daily.   Multiple Vitamin (MULTI-VITAMIN) tablet Take by  mouth.   Mycophenolate Mofetil (CELLCEPT PO) Take by mouth.   pantoprazole (PROTONIX) 40 MG tablet Take 1 tablet (40 mg total) by mouth daily.   polyethylene glycol powder (GLYCOLAX/MIRALAX) 17 GM/SCOOP powder Take by mouth.   predniSONE (DELTASONE) 5 MG tablet Take 5 mg by mouth daily with breakfast.   rosuvastatin (CRESTOR) 20 MG tablet Take 1 tablet (20 mg total) by mouth daily. For cholesterol   Tacrolimus ER 1 MG TB24 08/23/19 Take three- 1 mg tabs daily (total of 3 mg).   No facility-administered encounter medications on file as of 08/29/2022.    Allergies (verified) Duloxetine and Nsaids   History: Past Medical History:  Diagnosis Date   Arthritis    OA of both knees   Heart murmur, systolic    Hyperlipidemia    Hypertension    Pulmonary fibrosis ([place] 05/03/2018   Past Surgical History:  Procedure Laterality Date   CATARACT EXTRACTION, BILATERAL     HERNIA REPAIR     groin   HIP ARTHROPLASTY Right    JOINT REPLACEMENT Bilateral    knees   LUNG TRANSPLANT, SINGLE Right 09/15/2018   Family History  Problem Relation Age of Onset   Cancer Mother    Cancer Father    Social History   Socioeconomic History   Marital status: Married    Spouse name: Not on file   Number of children: 1  Years of education: Not on file   Highest education level: 12th grade  Occupational History   Occupation: Farm  Tobacco Use   Smoking status: Never   Smokeless tobacco: Never  Vaping Use   Vaping Use: Never used  Substance and Sexual Activity   Alcohol use: No   Drug use: No   Sexual activity: Not Currently  Other Topics Concern   Not on file  Social History Narrative   Married 1 child   S/p lung transplant   Social Determinants of Health   Financial Resource Strain: Low Risk  (08/29/2022)   Overall Financial Resource Strain (CARDIA)    Difficulty of Paying Living Expenses: Not hard at all  Food Insecurity: No Food Insecurity (08/29/2022)   Hunger Vital Sign     Worried About Running Out of Food in the Last Year: Never true    Ran Out of Food in the Last Year: Never true  Transportation Needs: No Transportation Needs (08/29/2022)   PRAPARE - Hydrologist (Medical): No    Lack of Transportation (Non-Medical): No  Physical Activity: Sufficiently Active (08/29/2022)   Exercise Vital Sign    Days of Exercise per Week: 5 days    Minutes of Exercise per Session: 30 min  Stress: No Stress Concern Present (08/29/2022)   [place]    Feeling of Stress : Not at all  Social Connections: [place] (08/29/2022)   Social Connection and Isolation Panel [NHANES]    Frequency of Communication with Friends and Family: More than three times a week    Frequency of Social Gatherings with Friends and Family: More than three times a week    Attends Religious Services: More than 4 times per year    Active Member of [employer] or Organizations: Yes    Attends Music therapist: More than 4 times per year    Marital Status: Married    Tobacco Counseling Counseling given: Not Answered   Clinical Intake:  Pre-visit preparation completed: Yes  Pain : No/denies pain     Nutritional Risks: None Diabetes: No  How often do you need to have someone help you when you read instructions, pamphlets, or other written materials from your doctor or pharmacy?: 1 - Never  Diabetic?no   Interpreter Needed?: No  Information entered by :: Jadene Pierini, LPN   Activities of Daily Living    08/29/2022   10:34 AM  In your present state of health, do you have any difficulty performing the following activities:  Hearing? 0  Vision? 0  Difficulty concentrating or making decisions? 0  Walking or climbing stairs? 0  Dressing or bathing? 0  Doing errands, shopping? 0  Preparing Food and eating ? N  Using the Toilet? N  In the past six months, have you  accidently leaked urine? N  Do you have problems with loss of bowel control? N  Managing your Medications? N  Managing your Finances? N  Housekeeping or managing your Housekeeping? N    Patient Care Team: Claretta Fraise, MD as PCP - General (Family Medicine)  Indicate any recent Medical Services you may have received from other than [facility] providers in the past year (date may be approximate).     Assessment:   This is a routine wellness examination for Kavish.  Hearing/Vision screen Vision Screening - Comments:: Annual eye exams   Dietary issues and exercise activities discussed: Current Exercise Habits: Home exercise  routine, Type of exercise: walking, Time (Minutes): 30, Frequency (Times/Week): 5, Weekly Exercise (Minutes/Week): 150, Intensity: Mild, Exercise limited by: None identified   Goals Addressed             This Visit's Progress    DIET - EAT MORE FRUITS AND VEGETABLES   On track     Depression Screen    08/29/2022   10:33 AM 08/07/2022    8:13 AM 02/05/2022    8:00 AM 08/07/2021   10:31 AM 08/01/2021    3:34 PM 07/06/2020    8:13 AM 10/11/2019    2:31 PM  PHQ 2/9 Scores  PHQ - 2 Score 0 0 0 0 0 0 0  PHQ- 9 Score 0 4         Fall Risk    08/29/2022   10:31 AM 02/05/2022    8:00 AM 08/07/2021   10:31 AM 08/01/2021    3:39 PM 07/06/2020    8:13 AM  Fall Risk   Falls in the past year? 0 0 0 0 0  Number falls in past yr: 0   0   Injury with Fall? 0   0   Risk for fall due to : No Fall Risks   Orthopedic patient   Follow up Falls prevention discussed   Falls prevention discussed Falls evaluation completed    [place]:  Any stairs in or around the home? Yes  If so, are there any without handrails? No  Home free of loose throw rugs in walkways, pet beds, electrical cords, etc? Yes  Adequate lighting in your home to reduce risk of falls? Yes   ASSISTIVE DEVICES UTILIZED TO PREVENT FALLS:  Life alert? No  Use of a  cane, walker or w/c? No  Grab bars in the bathroom? Yes  Shower chair or bench in shower? Yes  Elevated toilet seat or a handicapped toilet? Yes       05/27/2018   11:33 AM  MMSE - Mini Mental State Exam  Orientation to time 5  Orientation to Place 5  Registration 3  Attention/ Calculation 5  Recall 3  Language- name 2 objects 2  Language- repeat 1  Language- follow 3 step command 3  Language- read & follow direction 1  Write a sentence 1  Copy design 1  Total score 30        08/29/2022   10:34 AM 08/01/2021    3:40 PM  6CIT Screen  What Year? 0 points 0 points  What month? 0 points 0 points  What time? 0 points 0 points  Count back from 20 0 points 0 points  Months in reverse 0 points 0 points  Repeat phrase 0 points 0 points  Total Score 0 points 0 points    Immunizations Immunization History  Administered Date(s) Administered   Fluad Quad(high Dose 65+) 08/07/2021, 08/07/2022   Hepatitis B, adult 05/06/2018, 06/03/2018, 08/17/2018   Influenza, High Dose Seasonal PF 09/21/2017, 08/03/2018, 07/21/2019   Influenza, Seasonal, Injecte, Preservative Fre 07/19/2013, 09/13/2014, 09/18/2015, 09/17/2016   Influenza,inj,Quad PF,6+ Mos 07/19/2013, 09/13/2014, 09/18/2015, 09/17/2016   Moderna Sars-Covid-2 Vaccination 11/12/2019, 03/27/2020, 07/12/2020   Pneumococcal Conjugate-13 03/17/2017   Pneumococcal Polysaccharide-23 05/06/2018   Tdap 07/26/2012, 08/07/2022   Zoster Recombinat (Shingrix) 06/03/2018, 02/05/2022    TDAP status: Up to date  Flu Vaccine status: Up to date  Pneumococcal vaccine status: Up to date  Covid-19 vaccine status: Completed vaccines  Qualifies for Shingles Vaccine? Yes  Zostavax completed Yes   Shingrix Completed?: Yes  Screening Tests Health Maintenance  Topic Date Due   COVID-19 Vaccine (4 - Moderna risk series) 09/06/2020   Medicare Annual Wellness (AWV)  08/30/2023   COLONOSCOPY (Pts 45-85yr Insurance coverage will need to be  confirmed)  10/04/2023   TETANUS/TDAP  08/07/2032   Pneumonia Vaccine 72 Years old  Completed   INFLUENZA VACCINE  Completed   Hepatitis C Screening  Completed   Zoster Vaccines- Shingrix  Completed   HPV VACCINES  Aged Out    Health Maintenance  Health Maintenance Due  Topic Date Due   COVID-19 Vaccine (4 - Moderna risk series) 09/06/2020    Colorectal cancer screening: Type of screening: Colonoscopy. Completed 10/03/2018. Repeat every 10 years  Lung Cancer Screening: (Low Dose CT Chest recommended if Age 686-80years, 30 pack-year currently smoking OR have quit w/in 15years.) does not qualify.   Lung Cancer Screening Referral: n/a  Additional Screening:  Hepatitis C Screening: does not qualify;  Vision Screening: Recommended annual ophthalmology exams for early detection of glaucoma and other disorders of the eye. Is the patient up to date with their annual eye exam?  Yes  Who is the provider or what is the name of the [facility] which the patient [facility] If pt is not established with a provider, would they like to be referred to a provider to establish care? No .   Dental Screening: Recommended annual dental exams for proper oral hygiene  Community Resource Referral / Chronic Care Management: CRR required this visit?  No   CCM required this visit?  No      Plan:     I have personally reviewed and noted the following in the patient's chart:   Medical and social history Use of alcohol, tobacco or illicit drugs  Current medications and supplements including opioid prescriptions. Patient is not currently taking opioid prescriptions. Functional ability and status Nutritional status Physical activity Advanced directives List of other physicians Hospitalizations, surgeries, and ER visits in previous 12 months Vitals Screenings to include cognitive, depression, and falls Referrals and appointments  In addition, I have reviewed and  discussed with patient certain preventive protocols, quality metrics, and best practice recommendations. A written personalized care plan for preventive services as well as general preventive health recommendations were provided to patient.     LDaphane Shepherd LPN   [id number]  Nurse Notes: none

## 2022-09-23 DIAGNOSIS — D235 Other benign neoplasm of skin of trunk: Secondary | ICD-10-CM | POA: Diagnosis not present

## 2022-09-23 DIAGNOSIS — D225 Melanocytic nevi of trunk: Secondary | ICD-10-CM | POA: Diagnosis not present

## 2022-09-23 DIAGNOSIS — L814 Other melanin hyperpigmentation: Secondary | ICD-10-CM | POA: Diagnosis not present

## 2022-09-23 DIAGNOSIS — L821 Other seborrheic keratosis: Secondary | ICD-10-CM | POA: Diagnosis not present

## 2022-09-23 DIAGNOSIS — Z85828 Personal history of other malignant neoplasm of skin: Secondary | ICD-10-CM | POA: Diagnosis not present

## 2022-09-23 DIAGNOSIS — L579 Skin changes due to chronic exposure to nonionizing radiation, unspecified: Secondary | ICD-10-CM | POA: Diagnosis not present

## 2022-09-23 DIAGNOSIS — L570 Actinic keratosis: Secondary | ICD-10-CM | POA: Diagnosis not present

## 2022-09-24 DIAGNOSIS — Z942 Lung transplant status: Secondary | ICD-10-CM | POA: Diagnosis not present

## 2022-09-24 DIAGNOSIS — Z79899 Other long term (current) drug therapy: Secondary | ICD-10-CM | POA: Diagnosis not present

## 2022-09-24 DIAGNOSIS — T86819 Unspecified complication of lung transplant: Secondary | ICD-10-CM | POA: Diagnosis not present

## 2022-09-24 DIAGNOSIS — Z2989 Encounter for other specified prophylactic measures: Secondary | ICD-10-CM | POA: Diagnosis not present

## 2022-10-01 DIAGNOSIS — Z961 Presence of intraocular lens: Secondary | ICD-10-CM | POA: Diagnosis not present

## 2022-10-01 DIAGNOSIS — H02883 Meibomian gland dysfunction of right eye, unspecified eyelid: Secondary | ICD-10-CM | POA: Diagnosis not present

## 2022-10-01 DIAGNOSIS — H04123 Dry eye syndrome of bilateral lacrimal glands: Secondary | ICD-10-CM | POA: Diagnosis not present

## 2022-10-01 DIAGNOSIS — H02886 Meibomian gland dysfunction of left eye, unspecified eyelid: Secondary | ICD-10-CM | POA: Diagnosis not present

## 2022-10-06 DIAGNOSIS — D801 Nonfamilial hypogammaglobulinemia: Secondary | ICD-10-CM | POA: Diagnosis not present

## 2022-10-06 DIAGNOSIS — Z942 Lung transplant status: Secondary | ICD-10-CM | POA: Diagnosis not present

## 2022-10-06 DIAGNOSIS — D849 Immunodeficiency, unspecified: Secondary | ICD-10-CM | POA: Diagnosis not present

## 2022-10-14 DIAGNOSIS — R52 Pain, unspecified: Secondary | ICD-10-CM | POA: Diagnosis not present

## 2022-10-14 DIAGNOSIS — L570 Actinic keratosis: Secondary | ICD-10-CM | POA: Diagnosis not present

## 2022-10-23 DIAGNOSIS — Z7982 Long term (current) use of aspirin: Secondary | ICD-10-CM | POA: Diagnosis not present

## 2022-10-23 DIAGNOSIS — Z5181 Encounter for therapeutic drug level monitoring: Secondary | ICD-10-CM | POA: Diagnosis not present

## 2022-10-23 DIAGNOSIS — D801 Nonfamilial hypogammaglobulinemia: Secondary | ICD-10-CM | POA: Diagnosis not present

## 2022-10-23 DIAGNOSIS — Z79899 Other long term (current) drug therapy: Secondary | ICD-10-CM | POA: Diagnosis not present

## 2022-10-23 DIAGNOSIS — Z942 Lung transplant status: Secondary | ICD-10-CM | POA: Diagnosis not present

## 2022-10-23 DIAGNOSIS — T86818 Other complications of lung transplant: Secondary | ICD-10-CM | POA: Diagnosis not present

## 2022-10-23 DIAGNOSIS — N182 Chronic kidney disease, stage 2 (mild): Secondary | ICD-10-CM | POA: Diagnosis not present

## 2022-10-23 DIAGNOSIS — E1122 Type 2 diabetes mellitus with diabetic chronic kidney disease: Secondary | ICD-10-CM | POA: Diagnosis not present

## 2022-10-23 DIAGNOSIS — N1831 Chronic kidney disease, stage 3a: Secondary | ICD-10-CM | POA: Diagnosis not present

## 2022-10-27 ENCOUNTER — Other Ambulatory Visit: Payer: Self-pay | Admitting: Family Medicine

## 2022-10-27 DIAGNOSIS — E785 Hyperlipidemia, unspecified: Secondary | ICD-10-CM

## 2022-10-28 DIAGNOSIS — H02883 Meibomian gland dysfunction of right eye, unspecified eyelid: Secondary | ICD-10-CM | POA: Diagnosis not present

## 2022-10-28 DIAGNOSIS — Z961 Presence of intraocular lens: Secondary | ICD-10-CM | POA: Diagnosis not present

## 2022-10-28 DIAGNOSIS — H04123 Dry eye syndrome of bilateral lacrimal glands: Secondary | ICD-10-CM | POA: Diagnosis not present

## 2022-10-28 DIAGNOSIS — H02886 Meibomian gland dysfunction of left eye, unspecified eyelid: Secondary | ICD-10-CM | POA: Diagnosis not present

## 2022-10-29 DIAGNOSIS — R942 Abnormal results of pulmonary function studies: Secondary | ICD-10-CM | POA: Diagnosis not present

## 2022-10-29 DIAGNOSIS — T86818 Other complications of lung transplant: Secondary | ICD-10-CM | POA: Diagnosis not present

## 2022-10-29 DIAGNOSIS — Z8709 Personal history of other diseases of the respiratory system: Secondary | ICD-10-CM | POA: Diagnosis not present

## 2022-10-29 DIAGNOSIS — Z886 Allergy status to analgesic agent status: Secondary | ICD-10-CM | POA: Diagnosis not present

## 2022-10-29 DIAGNOSIS — I10 Essential (primary) hypertension: Secondary | ICD-10-CM | POA: Diagnosis not present

## 2022-10-29 DIAGNOSIS — Z96653 Presence of artificial knee joint, bilateral: Secondary | ICD-10-CM | POA: Diagnosis not present

## 2022-10-29 DIAGNOSIS — Z7952 Long term (current) use of systemic steroids: Secondary | ICD-10-CM | POA: Diagnosis not present

## 2022-10-29 DIAGNOSIS — Z79624 Long term (current) use of inhibitors of nucleotide synthesis: Secondary | ICD-10-CM | POA: Diagnosis not present

## 2022-10-29 DIAGNOSIS — Z7982 Long term (current) use of aspirin: Secondary | ICD-10-CM | POA: Diagnosis not present

## 2022-10-29 DIAGNOSIS — Z79621 Long term (current) use of calcineurin inhibitor: Secondary | ICD-10-CM | POA: Diagnosis not present

## 2022-10-29 DIAGNOSIS — Z942 Lung transplant status: Secondary | ICD-10-CM | POA: Diagnosis not present

## 2022-10-29 DIAGNOSIS — Z85828 Personal history of other malignant neoplasm of skin: Secondary | ICD-10-CM | POA: Diagnosis not present

## 2022-10-29 DIAGNOSIS — T86819 Unspecified complication of lung transplant: Secondary | ICD-10-CM | POA: Diagnosis not present

## 2022-10-29 DIAGNOSIS — J219 Acute bronchiolitis, unspecified: Secondary | ICD-10-CM | POA: Diagnosis not present

## 2022-10-29 DIAGNOSIS — Z79899 Other long term (current) drug therapy: Secondary | ICD-10-CM | POA: Diagnosis not present

## 2022-11-11 DIAGNOSIS — Z942 Lung transplant status: Secondary | ICD-10-CM | POA: Diagnosis not present

## 2022-11-11 DIAGNOSIS — T86819 Unspecified complication of lung transplant: Secondary | ICD-10-CM | POA: Diagnosis not present

## 2022-11-12 DIAGNOSIS — Z942 Lung transplant status: Secondary | ICD-10-CM | POA: Diagnosis not present

## 2022-11-12 DIAGNOSIS — T86819 Unspecified complication of lung transplant: Secondary | ICD-10-CM | POA: Diagnosis not present

## 2022-11-13 DIAGNOSIS — T86819 Unspecified complication of lung transplant: Secondary | ICD-10-CM | POA: Diagnosis not present

## 2022-11-13 DIAGNOSIS — Z942 Lung transplant status: Secondary | ICD-10-CM | POA: Diagnosis not present

## 2022-11-21 DIAGNOSIS — T86819 Unspecified complication of lung transplant: Secondary | ICD-10-CM | POA: Diagnosis not present

## 2022-11-21 DIAGNOSIS — Z79899 Other long term (current) drug therapy: Secondary | ICD-10-CM | POA: Diagnosis not present

## 2022-11-21 DIAGNOSIS — Z2989 Encounter for other specified prophylactic measures: Secondary | ICD-10-CM | POA: Diagnosis not present

## 2022-11-21 DIAGNOSIS — Z942 Lung transplant status: Secondary | ICD-10-CM | POA: Diagnosis not present

## 2022-11-26 DIAGNOSIS — R52 Pain, unspecified: Secondary | ICD-10-CM | POA: Diagnosis not present

## 2022-11-26 DIAGNOSIS — L570 Actinic keratosis: Secondary | ICD-10-CM | POA: Diagnosis not present

## 2022-12-10 DIAGNOSIS — Z961 Presence of intraocular lens: Secondary | ICD-10-CM | POA: Diagnosis not present

## 2022-12-10 DIAGNOSIS — H04123 Dry eye syndrome of bilateral lacrimal glands: Secondary | ICD-10-CM | POA: Diagnosis not present

## 2022-12-10 DIAGNOSIS — H02883 Meibomian gland dysfunction of right eye, unspecified eyelid: Secondary | ICD-10-CM | POA: Diagnosis not present

## 2022-12-10 DIAGNOSIS — H02886 Meibomian gland dysfunction of left eye, unspecified eyelid: Secondary | ICD-10-CM | POA: Diagnosis not present

## 2022-12-23 DIAGNOSIS — Z2989 Encounter for other specified prophylactic measures: Secondary | ICD-10-CM | POA: Diagnosis not present

## 2022-12-23 DIAGNOSIS — L570 Actinic keratosis: Secondary | ICD-10-CM | POA: Diagnosis not present

## 2022-12-23 DIAGNOSIS — D485 Neoplasm of uncertain behavior of skin: Secondary | ICD-10-CM | POA: Diagnosis not present

## 2022-12-23 DIAGNOSIS — Z85828 Personal history of other malignant neoplasm of skin: Secondary | ICD-10-CM | POA: Diagnosis not present

## 2022-12-23 DIAGNOSIS — D0462 Carcinoma in situ of skin of left upper limb, including shoulder: Secondary | ICD-10-CM | POA: Diagnosis not present

## 2022-12-23 DIAGNOSIS — L821 Other seborrheic keratosis: Secondary | ICD-10-CM | POA: Diagnosis not present

## 2022-12-23 DIAGNOSIS — Z942 Lung transplant status: Secondary | ICD-10-CM | POA: Diagnosis not present

## 2022-12-23 DIAGNOSIS — T86819 Unspecified complication of lung transplant: Secondary | ICD-10-CM | POA: Diagnosis not present

## 2022-12-23 DIAGNOSIS — D235 Other benign neoplasm of skin of trunk: Secondary | ICD-10-CM | POA: Diagnosis not present

## 2022-12-23 DIAGNOSIS — C44329 Squamous cell carcinoma of skin of other parts of face: Secondary | ICD-10-CM | POA: Diagnosis not present

## 2022-12-23 DIAGNOSIS — Z79899 Other long term (current) drug therapy: Secondary | ICD-10-CM | POA: Diagnosis not present

## 2022-12-23 DIAGNOSIS — L579 Skin changes due to chronic exposure to nonionizing radiation, unspecified: Secondary | ICD-10-CM | POA: Diagnosis not present

## 2022-12-23 DIAGNOSIS — L814 Other melanin hyperpigmentation: Secondary | ICD-10-CM | POA: Diagnosis not present

## 2023-01-01 DIAGNOSIS — Z79899 Other long term (current) drug therapy: Secondary | ICD-10-CM | POA: Diagnosis not present

## 2023-01-01 DIAGNOSIS — I480 Paroxysmal atrial fibrillation: Secondary | ICD-10-CM | POA: Diagnosis not present

## 2023-01-01 DIAGNOSIS — R0689 Other abnormalities of breathing: Secondary | ICD-10-CM | POA: Diagnosis not present

## 2023-01-01 DIAGNOSIS — J8410 Pulmonary fibrosis, unspecified: Secondary | ICD-10-CM | POA: Diagnosis not present

## 2023-01-01 DIAGNOSIS — Z942 Lung transplant status: Secondary | ICD-10-CM | POA: Diagnosis not present

## 2023-01-01 DIAGNOSIS — J849 Interstitial pulmonary disease, unspecified: Secondary | ICD-10-CM | POA: Diagnosis not present

## 2023-01-01 DIAGNOSIS — Z5181 Encounter for therapeutic drug level monitoring: Secondary | ICD-10-CM | POA: Diagnosis not present

## 2023-01-01 DIAGNOSIS — R0600 Dyspnea, unspecified: Secondary | ICD-10-CM | POA: Diagnosis not present

## 2023-01-01 DIAGNOSIS — T86818 Other complications of lung transplant: Secondary | ICD-10-CM | POA: Diagnosis not present

## 2023-01-05 DIAGNOSIS — I480 Paroxysmal atrial fibrillation: Secondary | ICD-10-CM | POA: Diagnosis not present

## 2023-01-05 DIAGNOSIS — R0689 Other abnormalities of breathing: Secondary | ICD-10-CM | POA: Diagnosis not present

## 2023-01-05 DIAGNOSIS — D849 Immunodeficiency, unspecified: Secondary | ICD-10-CM | POA: Diagnosis not present

## 2023-01-05 DIAGNOSIS — D801 Nonfamilial hypogammaglobulinemia: Secondary | ICD-10-CM | POA: Diagnosis not present

## 2023-01-05 DIAGNOSIS — J90 Pleural effusion, not elsewhere classified: Secondary | ICD-10-CM | POA: Diagnosis not present

## 2023-01-05 DIAGNOSIS — R0600 Dyspnea, unspecified: Secondary | ICD-10-CM | POA: Diagnosis not present

## 2023-01-05 DIAGNOSIS — Z942 Lung transplant status: Secondary | ICD-10-CM | POA: Diagnosis not present

## 2023-01-05 DIAGNOSIS — J9811 Atelectasis: Secondary | ICD-10-CM | POA: Diagnosis not present

## 2023-01-05 DIAGNOSIS — J849 Interstitial pulmonary disease, unspecified: Secondary | ICD-10-CM | POA: Diagnosis not present

## 2023-01-06 DIAGNOSIS — Z7982 Long term (current) use of aspirin: Secondary | ICD-10-CM | POA: Diagnosis not present

## 2023-01-06 DIAGNOSIS — Z7952 Long term (current) use of systemic steroids: Secondary | ICD-10-CM | POA: Diagnosis not present

## 2023-01-06 DIAGNOSIS — Z79624 Long term (current) use of inhibitors of nucleotide synthesis: Secondary | ICD-10-CM | POA: Diagnosis not present

## 2023-01-06 DIAGNOSIS — Z79621 Long term (current) use of calcineurin inhibitor: Secondary | ICD-10-CM | POA: Diagnosis not present

## 2023-01-06 DIAGNOSIS — Z792 Long term (current) use of antibiotics: Secondary | ICD-10-CM | POA: Diagnosis not present

## 2023-01-06 DIAGNOSIS — R0602 Shortness of breath: Secondary | ICD-10-CM | POA: Diagnosis not present

## 2023-01-06 DIAGNOSIS — E1122 Type 2 diabetes mellitus with diabetic chronic kidney disease: Secondary | ICD-10-CM | POA: Diagnosis not present

## 2023-01-06 DIAGNOSIS — Z942 Lung transplant status: Secondary | ICD-10-CM | POA: Diagnosis not present

## 2023-01-06 DIAGNOSIS — D84821 Immunodeficiency due to drugs: Secondary | ICD-10-CM | POA: Diagnosis not present

## 2023-01-06 DIAGNOSIS — N182 Chronic kidney disease, stage 2 (mild): Secondary | ICD-10-CM | POA: Diagnosis not present

## 2023-01-06 DIAGNOSIS — T86818 Other complications of lung transplant: Secondary | ICD-10-CM | POA: Diagnosis not present

## 2023-01-06 DIAGNOSIS — Z79899 Other long term (current) drug therapy: Secondary | ICD-10-CM | POA: Diagnosis not present

## 2023-01-06 DIAGNOSIS — J90 Pleural effusion, not elsewhere classified: Secondary | ICD-10-CM | POA: Diagnosis not present

## 2023-01-06 DIAGNOSIS — I2510 Atherosclerotic heart disease of native coronary artery without angina pectoris: Secondary | ICD-10-CM | POA: Diagnosis not present

## 2023-01-21 DIAGNOSIS — T86810 Lung transplant rejection: Secondary | ICD-10-CM | POA: Diagnosis not present

## 2023-01-21 DIAGNOSIS — Z8616 Personal history of COVID-19: Secondary | ICD-10-CM | POA: Diagnosis not present

## 2023-01-21 DIAGNOSIS — M810 Age-related osteoporosis without current pathological fracture: Secondary | ICD-10-CM | POA: Diagnosis not present

## 2023-01-21 DIAGNOSIS — K219 Gastro-esophageal reflux disease without esophagitis: Secondary | ICD-10-CM | POA: Diagnosis not present

## 2023-01-21 DIAGNOSIS — D84821 Immunodeficiency due to drugs: Secondary | ICD-10-CM | POA: Diagnosis not present

## 2023-01-21 DIAGNOSIS — E1122 Type 2 diabetes mellitus with diabetic chronic kidney disease: Secondary | ICD-10-CM | POA: Diagnosis not present

## 2023-01-21 DIAGNOSIS — I10 Essential (primary) hypertension: Secondary | ICD-10-CM | POA: Diagnosis not present

## 2023-01-21 DIAGNOSIS — Z5181 Encounter for therapeutic drug level monitoring: Secondary | ICD-10-CM | POA: Diagnosis not present

## 2023-01-21 DIAGNOSIS — I480 Paroxysmal atrial fibrillation: Secondary | ICD-10-CM | POA: Diagnosis not present

## 2023-01-21 DIAGNOSIS — J4A9 Chronic lung allograft dysfunction, unspecified: Secondary | ICD-10-CM | POA: Diagnosis not present

## 2023-01-21 DIAGNOSIS — J8410 Pulmonary fibrosis, unspecified: Secondary | ICD-10-CM | POA: Diagnosis not present

## 2023-01-21 DIAGNOSIS — K224 Dyskinesia of esophagus: Secondary | ICD-10-CM | POA: Diagnosis not present

## 2023-01-21 DIAGNOSIS — Z961 Presence of intraocular lens: Secondary | ICD-10-CM | POA: Diagnosis not present

## 2023-01-21 DIAGNOSIS — T86818 Other complications of lung transplant: Secondary | ICD-10-CM | POA: Diagnosis not present

## 2023-01-21 DIAGNOSIS — I129 Hypertensive chronic kidney disease with stage 1 through stage 4 chronic kidney disease, or unspecified chronic kidney disease: Secondary | ICD-10-CM | POA: Diagnosis not present

## 2023-01-21 DIAGNOSIS — Z9841 Cataract extraction status, right eye: Secondary | ICD-10-CM | POA: Diagnosis not present

## 2023-01-21 DIAGNOSIS — R942 Abnormal results of pulmonary function studies: Secondary | ICD-10-CM | POA: Diagnosis not present

## 2023-01-21 DIAGNOSIS — D61818 Other pancytopenia: Secondary | ICD-10-CM | POA: Diagnosis not present

## 2023-01-21 DIAGNOSIS — E1165 Type 2 diabetes mellitus with hyperglycemia: Secondary | ICD-10-CM | POA: Diagnosis not present

## 2023-01-21 DIAGNOSIS — Z9842 Cataract extraction status, left eye: Secondary | ICD-10-CM | POA: Diagnosis not present

## 2023-01-21 DIAGNOSIS — D849 Immunodeficiency, unspecified: Secondary | ICD-10-CM | POA: Diagnosis not present

## 2023-01-21 DIAGNOSIS — E785 Hyperlipidemia, unspecified: Secondary | ICD-10-CM | POA: Diagnosis not present

## 2023-01-21 DIAGNOSIS — Z79899 Other long term (current) drug therapy: Secondary | ICD-10-CM | POA: Diagnosis not present

## 2023-01-21 DIAGNOSIS — Z942 Lung transplant status: Secondary | ICD-10-CM | POA: Diagnosis not present

## 2023-01-21 DIAGNOSIS — N183 Chronic kidney disease, stage 3 unspecified: Secondary | ICD-10-CM | POA: Diagnosis not present

## 2023-01-21 DIAGNOSIS — J90 Pleural effusion, not elsewhere classified: Secondary | ICD-10-CM | POA: Diagnosis not present

## 2023-01-21 DIAGNOSIS — T380X5A Adverse effect of glucocorticoids and synthetic analogues, initial encounter: Secondary | ICD-10-CM | POA: Diagnosis not present

## 2023-01-21 DIAGNOSIS — N1831 Chronic kidney disease, stage 3a: Secondary | ICD-10-CM | POA: Diagnosis not present

## 2023-01-21 DIAGNOSIS — D801 Nonfamilial hypogammaglobulinemia: Secondary | ICD-10-CM | POA: Diagnosis not present

## 2023-01-21 DIAGNOSIS — Z79624 Long term (current) use of inhibitors of nucleotide synthesis: Secondary | ICD-10-CM | POA: Diagnosis not present

## 2023-01-21 DIAGNOSIS — Z20822 Contact with and (suspected) exposure to covid-19: Secondary | ICD-10-CM | POA: Diagnosis not present

## 2023-01-21 DIAGNOSIS — Z7982 Long term (current) use of aspirin: Secondary | ICD-10-CM | POA: Diagnosis not present

## 2023-01-21 DIAGNOSIS — E559 Vitamin D deficiency, unspecified: Secondary | ICD-10-CM | POA: Diagnosis not present

## 2023-01-28 ENCOUNTER — Other Ambulatory Visit: Payer: Self-pay | Admitting: Family Medicine

## 2023-01-28 DIAGNOSIS — L570 Actinic keratosis: Secondary | ICD-10-CM | POA: Diagnosis not present

## 2023-01-28 DIAGNOSIS — L579 Skin changes due to chronic exposure to nonionizing radiation, unspecified: Secondary | ICD-10-CM | POA: Diagnosis not present

## 2023-01-28 DIAGNOSIS — D0439 Carcinoma in situ of skin of other parts of face: Secondary | ICD-10-CM | POA: Diagnosis not present

## 2023-01-28 DIAGNOSIS — D485 Neoplasm of uncertain behavior of skin: Secondary | ICD-10-CM | POA: Diagnosis not present

## 2023-01-28 DIAGNOSIS — Z85828 Personal history of other malignant neoplasm of skin: Secondary | ICD-10-CM | POA: Diagnosis not present

## 2023-01-28 DIAGNOSIS — D0462 Carcinoma in situ of skin of left upper limb, including shoulder: Secondary | ICD-10-CM | POA: Diagnosis not present

## 2023-01-29 ENCOUNTER — Telehealth: Payer: Self-pay

## 2023-01-29 NOTE — Transitions of Care (Post Inpatient/ED Visit) (Signed)
   01/29/2023  Name: Vernon Richard MRN: [id number] DOB: 03/29/1951  Today's TOC FU Call Status: Today's TOC FU Call Status:: Successful TOC FU Call Competed TOC FU Call Complete Date: 01/29/23  Transition Care Management Follow-up Telephone Call Date of Discharge: 01/27/23 Discharge Facility: Other ([facility]) Name of Other ([facility]) Discharge Facility: [facility] Type of Discharge: Inpatient Admission Primary Inpatient Discharge Diagnosis:: Pleural Effusion, Decreased PFT's How have you been since you were released from the hospital?: Better Any questions or concerns?: No  Items Reviewed: Did you receive and understand the discharge instructions provided?: Yes Medications obtained and verified?: Yes (Medications Reviewed) Any new allergies since your discharge?: No Dietary orders reviewed?: No Do you have support at home?: Yes People in Home: spouse Name of Support/Comfort Primary Source: [facility] and Equipment/Supplies: Were Home Health Services Ordered?: No Any new equipment or medical supplies ordered?: No  Functional Questionnaire: Do you need assistance with bathing/showering or dressing?: No Do you need assistance with meal preparation?: No Do you need assistance with eating?: No Do you have difficulty maintaining continence: No Do you need assistance with getting out of bed/getting out of a chair/moving?: No Do you have difficulty managing or taking your medications?: No  Follow up appointments reviewed: PCP Follow-up appointment confirmed?: Yes Date of PCP follow-up appointment?: 02/04/23 Follow-up Provider: Dr. Darlyn Read Specialist [facility] Follow-up appointment confirmed?: Yes (Appointment at [facility] for follow up) Do you need transportation to your follow-up appointment?: No Do you understand care options if your condition(s) worsen?: Yes-patient verbalized understanding  Jodelle Gross, RN, BSN, CCM Care Management Coordinator [facility] Phone: [phone]/Fax: [phone]

## 2023-02-03 DIAGNOSIS — Z2989 Encounter for other specified prophylactic measures: Secondary | ICD-10-CM | POA: Diagnosis not present

## 2023-02-03 DIAGNOSIS — T86819 Unspecified complication of lung transplant: Secondary | ICD-10-CM | POA: Diagnosis not present

## 2023-02-03 DIAGNOSIS — Z942 Lung transplant status: Secondary | ICD-10-CM | POA: Diagnosis not present

## 2023-02-03 DIAGNOSIS — Z79899 Other long term (current) drug therapy: Secondary | ICD-10-CM | POA: Diagnosis not present

## 2023-02-03 DIAGNOSIS — C44329 Squamous cell carcinoma of skin of other parts of face: Secondary | ICD-10-CM | POA: Diagnosis not present

## 2023-02-03 HISTORY — PX: MOHS SURGERY: SUR867

## 2023-02-04 ENCOUNTER — Encounter: Payer: Self-pay | Admitting: Family Medicine

## 2023-02-04 ENCOUNTER — Ambulatory Visit (INDEPENDENT_AMBULATORY_CARE_PROVIDER_SITE_OTHER): Payer: Medicare Other | Admitting: Family Medicine

## 2023-02-04 VITALS — BP 117/69 | HR 86 | Temp 97.2°F | Ht 68.0 in | Wt 167.2 lb

## 2023-02-04 DIAGNOSIS — I10 Essential (primary) hypertension: Secondary | ICD-10-CM | POA: Diagnosis not present

## 2023-02-04 DIAGNOSIS — E782 Mixed hyperlipidemia: Secondary | ICD-10-CM | POA: Diagnosis not present

## 2023-02-04 DIAGNOSIS — R7303 Prediabetes: Secondary | ICD-10-CM | POA: Diagnosis not present

## 2023-02-04 LAB — BAYER DCA HB A1C WAIVED: HB A1C (BAYER DCA - WAIVED): 5.7 % — ABNORMAL HIGH (ref 4.8–5.6)

## 2023-02-04 LAB — CMP14+EGFR

## 2023-02-04 LAB — CBC WITH DIFFERENTIAL/PLATELET
Eos: 1 %
Hemoglobin: 11.9 g/dL — ABNORMAL LOW (ref 13.0–17.7)
Immature Granulocytes: 4 %
Lymphs: 2 %
Monocytes Absolute: 0.7 10*3/uL (ref 0.1–0.9)
Monocytes: 16 %
Platelets: 303 10*3/uL (ref 150–450)

## 2023-02-04 LAB — LIPID PANEL

## 2023-02-04 MED ORDER — ROSUVASTATIN CALCIUM 20 MG PO TABS: 20.0000 mg | ORAL_TABLET | Freq: Every day | ORAL | 3 refills | Status: DC

## 2023-02-04 MED ORDER — DENOSUMAB 60 MG/ML ~~LOC~~ SOSY: 60.0000 mg | PREFILLED_SYRINGE | SUBCUTANEOUS | 0 refills | Status: DC

## 2023-02-04 NOTE — Progress Notes (Signed)
Subjective:  Patient ID: Vernon Richard, male    DOB: [birth year]  Age: 72 y.o. MRN: [id number]  CC: Medical Management of Chronic Issues   HPI DEVEAN SKOCZYLAS presents for prolia. Didn't get it last time it was ordered.    presents for  follow-up of hypertension. Patient has no history of headache chest pain or shortness of breath or recent cough. Patient also denies symptoms of TIA such as focal numbness or weakness. Patient denies side effects from medication. States taking it regularly. Staying really good on self checks.      02/04/2023    8:22 AM 08/29/2022   10:33 AM 08/07/2022    8:13 AM  Depression screen PHQ 2/9  Decreased Interest 0 0 0  Down, Depressed, Hopeless 0 0 0  PHQ - 2 Score 0 0 0  Altered sleeping  0 3  Tired, decreased energy  0 1  Change in appetite  0 0  Feeling bad or failure about yourself   0 0  Trouble concentrating  0 0  Moving slowly or fidgety/restless  0 0  Suicidal thoughts  0 0  PHQ-9 Score  0 4  Difficult doing work/chores  Not difficult at all Not difficult at all    History Eulogio has a past medical history of Arthritis, Heart murmur, systolic, Hyperlipidemia, Hypertension, and Pulmonary fibrosis (05/03/2018).   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   His family history includes Cancer in his father and mother.He reports that he has never smoked. He has never used smokeless tobacco. He reports that he does not drink alcohol and does not use drugs.    ROS Review of Systems  Constitutional:  Negative for fever.  Respiratory:  Negative for shortness of breath.   Cardiovascular:  Negative for chest pain.  Musculoskeletal:  Negative for arthralgias.  Skin:  Negative for rash.    Objective:  BP 117/69   Pulse 86   Temp (!) 97.2 F (36.2 C)   Ht   (1.727 m)   Wt 167 lb 3.2 oz (75.8 kg)   SpO2 97%   BMI 25.42 kg/m   BP Readings from Last 3 Encounters:  02/04/23 117/69  08/07/22 120/68  02/05/22 123/69    Wt Readings from Last 3 Encounters:  02/04/23 167 lb 3.2 oz (75.8 kg)  08/29/22 168 lb (76.2 kg)  08/07/22 171 lb (77.6 kg)     Physical Exam Vitals reviewed.  Constitutional:      Appearance: He is well-developed.  HENT:     Head: Normocephalic and atraumatic.     Right Ear: External ear normal.     Left Ear: External ear normal.     Mouth/Throat:     Pharynx: No oropharyngeal exudate or posterior oropharyngeal erythema.  Eyes:     Pupils: Pupils are equal, round, and reactive to light.  Cardiovascular:     Rate and Rhythm: Normal rate and regular rhythm.     Heart sounds: No murmur heard. Pulmonary:     Effort: No respiratory distress.     Breath sounds: Normal breath sounds.  Musculoskeletal:     Cervical back: Normal range of motion and neck supple.  Neurological:     Mental Status: He is alert and oriented to person, place, and time.       Assessment & Plan:   Tavonte was  seen today for medical management of chronic issues.  Diagnoses and all orders for this visit:  Mixed hyperlipidemia -     Lipid panel  Essential hypertension -     CBC with Differential/Platelet -     CMP14+EGFR  Prediabetes -     Bayer DCA Hb A1c Waived  Other orders -     Discontinue: denosumab (PROLIA) 60 MG/ML SOSY injection; Inject 60 mg into the skin every 6 (six) months. -     rosuvastatin (CRESTOR) 20 MG tablet; Take 1 tablet (20 mg total) by mouth daily. For cholesterol -     denosumab (PROLIA) 60 MG/ML SOSY injection; Inject 60 mg into the skin every 6 (six) months.       I am having [name] maintain his aspirin, cetirizine, tacrolimus ER, MAGNESIUM GLYCINATE PO, acetaminophen, polyethylene glycol powder, Calcium Citrate-Vitamin D, Multi-Vitamin, fluticasone, Mycophenolate Mofetil (CELLCEPT PO),  amLODipine, pantoprazole, predniSONE, cephALEXin, acyclovir, rosuvastatin, and denosumab.  Allergies as of 02/04/2023       Reactions   Duloxetine Other (See Comments)   Made his arhritis worse.    Nsaids Other (See Comments)   Transplant        Medication List        Accurate as of February 04, 2023  9:56 PM. If you have any questions, ask your nurse or doctor.          acetaminophen 500 MG tablet Commonly known as: TYLENOL Take by mouth.   acyclovir 200 MG capsule Commonly known as: ZOVIRAX Take 200 mg by mouth 2 (two) times daily.   amLODipine 5 MG tablet Commonly known as: NORVASC Take 1 tablet (5 mg total) by mouth daily.   aspirin 81 MG tablet Take 81 mg by mouth daily.   Calcium Citrate-Vitamin D 200-250 MG-UNIT Tabs Take by mouth.   CELLCEPT PO Take by mouth.   cephALEXin 500 MG capsule Commonly known as: KEFLEX Take 500 mg by mouth 3 (three) times daily.   cetirizine 10 MG tablet Commonly known as: ZYRTEC Take 10 mg by mouth daily.   denosumab 60 MG/ML Sosy injection Commonly known as: PROLIA Inject 60 mg into the skin every 6 (six) months.   fluticasone 50 MCG/ACT nasal spray Commonly known as: FLONASE Place 2 sprays into both nostrils daily.   MAGNESIUM GLYCINATE PO Take 800 mg by mouth 2 (two) times daily.   Multi-Vitamin tablet Take by mouth.   pantoprazole 40 MG tablet Commonly known as: PROTONIX Take 1 tablet (40 mg total) by mouth daily.   polyethylene glycol powder 17 GM/SCOOP powder Commonly known as: GLYCOLAX/MIRALAX Take by mouth.   predniSONE 5 MG tablet Commonly known as: DELTASONE Take 5 mg by mouth daily with breakfast.   rosuvastatin 20 MG tablet Commonly known as: Crestor Take 1 tablet (20 mg total) by mouth daily. For cholesterol   tacrolimus ER 1 MG Tb24 Commonly known as: ENVARSUS XR 08/23/19 Take three- 1 mg tabs daily (total of 3 mg).         Follow-up: Return in about 6 months (around  08/06/2023).  Mechele Claude, M.D.

## 2023-02-05 LAB — CMP14+EGFR
ALT: 20 IU/L (ref 0–44)
Albumin/Globulin Ratio: 2.3 — ABNORMAL HIGH (ref 1.2–2.2)
Albumin: 4.1 g/dL (ref 3.8–4.8)
Alkaline Phosphatase: 47 IU/L (ref 44–121)
BUN/Creatinine Ratio: 12 (ref 10–24)
Bilirubin Total: 0.5 mg/dL (ref 0.0–1.2)
CO2: 23 mmol/L (ref 20–29)
Chloride: 100 mmol/L (ref 96–106)
Creatinine, Ser: 1.37 mg/dL — ABNORMAL HIGH (ref 0.76–1.27)
Globulin, Total: 1.8 g/dL (ref 1.5–4.5)
Potassium: 5 mmol/L (ref 3.5–5.2)
Sodium: 137 mmol/L (ref 134–144)
eGFR: 55 mL/min/{1.73_m2} — ABNORMAL LOW (ref >59)

## 2023-02-05 LAB — CBC WITH DIFFERENTIAL/PLATELET
Basophils Absolute: 0 10*3/uL (ref 0.0–0.2)
Basos: 1 %
EOS (ABSOLUTE): 0.1 10*3/uL (ref 0.0–0.4)
Hematocrit: 38.5 % (ref 37.5–51.0)
Immature Grans (Abs): 0.2 10*3/uL — ABNORMAL HIGH (ref 0.0–0.1)
Lymphocytes Absolute: 0.1 10*3/uL — ABNORMAL LOW (ref 0.7–3.1)
MCH: 24.5 pg — ABNORMAL LOW (ref 26.6–33.0)
MCHC: 30.9 g/dL — ABNORMAL LOW (ref 31.5–35.7)
MCV: 79 fL (ref 79–97)
Neutrophils Absolute: 3.4 10*3/uL (ref 1.4–7.0)
Neutrophils: 76 %
RBC: 4.85 x10E6/uL (ref 4.14–5.80)
RDW: 15.9 % — ABNORMAL HIGH (ref 11.6–15.4)
WBC: 4.5 10*3/uL (ref 3.4–10.8)

## 2023-02-05 LAB — LIPID PANEL
Cholesterol, Total: 160 mg/dL (ref 100–199)
HDL: 51 mg/dL (ref >39)
LDL Chol Calc (NIH): 80 mg/dL (ref 0–99)
Triglycerides: 173 mg/dL — ABNORMAL HIGH (ref 0–149)
VLDL Cholesterol Cal: 29 mg/dL (ref 5–40)

## 2023-02-05 NOTE — Progress Notes (Signed)
Hello Vernon Richard,  Your lab result is normal and/or stable.Some minor variations that are not significant are commonly marked abnormal, but do not represent any medical problem for you.  Best regards, Shyonna Carlin, M.D.

## 2023-02-09 DIAGNOSIS — Z942 Lung transplant status: Secondary | ICD-10-CM | POA: Diagnosis not present

## 2023-02-09 DIAGNOSIS — Z79899 Other long term (current) drug therapy: Secondary | ICD-10-CM | POA: Diagnosis not present

## 2023-02-09 DIAGNOSIS — Z2989 Encounter for other specified prophylactic measures: Secondary | ICD-10-CM | POA: Diagnosis not present

## 2023-02-09 DIAGNOSIS — C44329 Squamous cell carcinoma of skin of other parts of face: Secondary | ICD-10-CM | POA: Diagnosis not present

## 2023-02-09 DIAGNOSIS — T86819 Unspecified complication of lung transplant: Secondary | ICD-10-CM | POA: Diagnosis not present

## 2023-02-25 DIAGNOSIS — D225 Melanocytic nevi of trunk: Secondary | ICD-10-CM | POA: Diagnosis not present

## 2023-02-25 DIAGNOSIS — Z85828 Personal history of other malignant neoplasm of skin: Secondary | ICD-10-CM | POA: Diagnosis not present

## 2023-02-25 DIAGNOSIS — L570 Actinic keratosis: Secondary | ICD-10-CM | POA: Diagnosis not present

## 2023-02-25 DIAGNOSIS — Z79899 Other long term (current) drug therapy: Secondary | ICD-10-CM | POA: Diagnosis not present

## 2023-02-25 DIAGNOSIS — T86819 Unspecified complication of lung transplant: Secondary | ICD-10-CM | POA: Diagnosis not present

## 2023-02-25 DIAGNOSIS — L814 Other melanin hyperpigmentation: Secondary | ICD-10-CM | POA: Diagnosis not present

## 2023-02-25 DIAGNOSIS — L821 Other seborrheic keratosis: Secondary | ICD-10-CM | POA: Diagnosis not present

## 2023-02-25 DIAGNOSIS — Z5181 Encounter for therapeutic drug level monitoring: Secondary | ICD-10-CM | POA: Diagnosis not present

## 2023-02-25 DIAGNOSIS — L579 Skin changes due to chronic exposure to nonionizing radiation, unspecified: Secondary | ICD-10-CM | POA: Diagnosis not present

## 2023-02-25 DIAGNOSIS — Z942 Lung transplant status: Secondary | ICD-10-CM | POA: Diagnosis not present

## 2023-03-05 ENCOUNTER — Telehealth: Payer: Self-pay | Admitting: Family Medicine

## 2023-03-05 DIAGNOSIS — D801 Nonfamilial hypogammaglobulinemia: Secondary | ICD-10-CM | POA: Diagnosis not present

## 2023-03-05 DIAGNOSIS — Z79899 Other long term (current) drug therapy: Secondary | ICD-10-CM | POA: Diagnosis not present

## 2023-03-05 DIAGNOSIS — Z5181 Encounter for therapeutic drug level monitoring: Secondary | ICD-10-CM | POA: Diagnosis not present

## 2023-03-05 DIAGNOSIS — N1831 Chronic kidney disease, stage 3a: Secondary | ICD-10-CM | POA: Diagnosis not present

## 2023-03-05 DIAGNOSIS — Z7952 Long term (current) use of systemic steroids: Secondary | ICD-10-CM | POA: Diagnosis not present

## 2023-03-05 DIAGNOSIS — I480 Paroxysmal atrial fibrillation: Secondary | ICD-10-CM | POA: Diagnosis not present

## 2023-03-05 DIAGNOSIS — M25562 Pain in left knee: Secondary | ICD-10-CM | POA: Diagnosis not present

## 2023-03-05 DIAGNOSIS — E785 Hyperlipidemia, unspecified: Secondary | ICD-10-CM | POA: Diagnosis not present

## 2023-03-05 DIAGNOSIS — Z794 Long term (current) use of insulin: Secondary | ICD-10-CM | POA: Diagnosis not present

## 2023-03-05 DIAGNOSIS — Z96641 Presence of right artificial hip joint: Secondary | ICD-10-CM | POA: Diagnosis not present

## 2023-03-05 DIAGNOSIS — Z942 Lung transplant status: Secondary | ICD-10-CM | POA: Diagnosis not present

## 2023-03-05 DIAGNOSIS — J90 Pleural effusion, not elsewhere classified: Secondary | ICD-10-CM | POA: Diagnosis not present

## 2023-03-05 DIAGNOSIS — J84112 Idiopathic pulmonary fibrosis: Secondary | ICD-10-CM | POA: Diagnosis not present

## 2023-03-05 DIAGNOSIS — M25561 Pain in right knee: Secondary | ICD-10-CM | POA: Diagnosis not present

## 2023-03-05 DIAGNOSIS — I129 Hypertensive chronic kidney disease with stage 1 through stage 4 chronic kidney disease, or unspecified chronic kidney disease: Secondary | ICD-10-CM | POA: Diagnosis not present

## 2023-03-05 DIAGNOSIS — T86818 Other complications of lung transplant: Secondary | ICD-10-CM | POA: Diagnosis not present

## 2023-03-05 DIAGNOSIS — J8410 Pulmonary fibrosis, unspecified: Secondary | ICD-10-CM | POA: Diagnosis not present

## 2023-03-05 DIAGNOSIS — E1122 Type 2 diabetes mellitus with diabetic chronic kidney disease: Secondary | ICD-10-CM | POA: Diagnosis not present

## 2023-03-05 DIAGNOSIS — D84821 Immunodeficiency due to drugs: Secondary | ICD-10-CM | POA: Diagnosis not present

## 2023-03-05 DIAGNOSIS — Z4824 Encounter for aftercare following lung transplant: Secondary | ICD-10-CM | POA: Diagnosis not present

## 2023-03-05 DIAGNOSIS — R942 Abnormal results of pulmonary function studies: Secondary | ICD-10-CM | POA: Diagnosis not present

## 2023-03-05 DIAGNOSIS — Z79624 Long term (current) use of inhibitors of nucleotide synthesis: Secondary | ICD-10-CM | POA: Diagnosis not present

## 2023-03-05 NOTE — Telephone Encounter (Signed)
Contacted Vernon Richard to schedule their annual wellness visit. Appointment made for 08/31/2023.  Thank you,  [name],  AMB Clinical Support [facility] Program Direct Dial ??[phone]

## 2023-03-06 ENCOUNTER — Telehealth: Payer: Self-pay | Admitting: Family Medicine

## 2023-03-06 DIAGNOSIS — Z0289 Encounter for other administrative examinations: Secondary | ICD-10-CM

## 2023-03-06 NOTE — Telephone Encounter (Signed)
Slater Rhett dropped off [employer] forms to be completed and signed.  Form Fee Paid? (Y/N)     YES       If NO, form is placed on front office manager desk to hold until payment received. If YES, then form will be placed in the RX/HH Nurse Coordinators box for completion.  Form will not be processed until payment is received

## 2023-03-18 DIAGNOSIS — L570 Actinic keratosis: Secondary | ICD-10-CM | POA: Diagnosis not present

## 2023-03-18 DIAGNOSIS — D485 Neoplasm of uncertain behavior of skin: Secondary | ICD-10-CM | POA: Diagnosis not present

## 2023-03-18 DIAGNOSIS — Z85828 Personal history of other malignant neoplasm of skin: Secondary | ICD-10-CM | POA: Diagnosis not present

## 2023-03-18 DIAGNOSIS — C44329 Squamous cell carcinoma of skin of other parts of face: Secondary | ICD-10-CM | POA: Diagnosis not present

## 2023-04-02 DIAGNOSIS — T86819 Unspecified complication of lung transplant: Secondary | ICD-10-CM | POA: Diagnosis not present

## 2023-04-02 DIAGNOSIS — C44329 Squamous cell carcinoma of skin of other parts of face: Secondary | ICD-10-CM | POA: Diagnosis not present

## 2023-04-02 DIAGNOSIS — Z79899 Other long term (current) drug therapy: Secondary | ICD-10-CM | POA: Diagnosis not present

## 2023-04-02 DIAGNOSIS — Z942 Lung transplant status: Secondary | ICD-10-CM | POA: Diagnosis not present

## 2023-04-02 DIAGNOSIS — Z5181 Encounter for therapeutic drug level monitoring: Secondary | ICD-10-CM | POA: Diagnosis not present

## 2023-04-21 DIAGNOSIS — L570 Actinic keratosis: Secondary | ICD-10-CM | POA: Diagnosis not present

## 2023-05-01 DIAGNOSIS — Z5181 Encounter for therapeutic drug level monitoring: Secondary | ICD-10-CM | POA: Diagnosis not present

## 2023-05-01 DIAGNOSIS — Z79899 Other long term (current) drug therapy: Secondary | ICD-10-CM | POA: Diagnosis not present

## 2023-05-01 DIAGNOSIS — Z942 Lung transplant status: Secondary | ICD-10-CM | POA: Diagnosis not present

## 2023-05-01 DIAGNOSIS — T86819 Unspecified complication of lung transplant: Secondary | ICD-10-CM | POA: Diagnosis not present

## 2023-06-05 DIAGNOSIS — Z5181 Encounter for therapeutic drug level monitoring: Secondary | ICD-10-CM | POA: Diagnosis not present

## 2023-06-05 DIAGNOSIS — Z942 Lung transplant status: Secondary | ICD-10-CM | POA: Diagnosis not present

## 2023-06-05 DIAGNOSIS — Z79899 Other long term (current) drug therapy: Secondary | ICD-10-CM | POA: Diagnosis not present

## 2023-06-05 DIAGNOSIS — T86819 Unspecified complication of lung transplant: Secondary | ICD-10-CM | POA: Diagnosis not present

## 2023-06-17 DIAGNOSIS — L570 Actinic keratosis: Secondary | ICD-10-CM | POA: Diagnosis not present

## 2023-06-17 DIAGNOSIS — D485 Neoplasm of uncertain behavior of skin: Secondary | ICD-10-CM | POA: Diagnosis not present

## 2023-06-17 DIAGNOSIS — L821 Other seborrheic keratosis: Secondary | ICD-10-CM | POA: Diagnosis not present

## 2023-06-17 DIAGNOSIS — L814 Other melanin hyperpigmentation: Secondary | ICD-10-CM | POA: Diagnosis not present

## 2023-06-17 DIAGNOSIS — C4442 Squamous cell carcinoma of skin of scalp and neck: Secondary | ICD-10-CM | POA: Diagnosis not present

## 2023-06-17 DIAGNOSIS — Z85828 Personal history of other malignant neoplasm of skin: Secondary | ICD-10-CM | POA: Diagnosis not present

## 2023-06-17 DIAGNOSIS — L579 Skin changes due to chronic exposure to nonionizing radiation, unspecified: Secondary | ICD-10-CM | POA: Diagnosis not present

## 2023-06-17 DIAGNOSIS — C44329 Squamous cell carcinoma of skin of other parts of face: Secondary | ICD-10-CM | POA: Diagnosis not present

## 2023-06-23 DIAGNOSIS — Z961 Presence of intraocular lens: Secondary | ICD-10-CM | POA: Diagnosis not present

## 2023-06-23 DIAGNOSIS — H04123 Dry eye syndrome of bilateral lacrimal glands: Secondary | ICD-10-CM | POA: Diagnosis not present

## 2023-06-23 DIAGNOSIS — H02883 Meibomian gland dysfunction of right eye, unspecified eyelid: Secondary | ICD-10-CM | POA: Diagnosis not present

## 2023-06-23 DIAGNOSIS — H02886 Meibomian gland dysfunction of left eye, unspecified eyelid: Secondary | ICD-10-CM | POA: Diagnosis not present

## 2023-06-30 DIAGNOSIS — Z01818 Encounter for other preprocedural examination: Secondary | ICD-10-CM | POA: Diagnosis not present

## 2023-06-30 DIAGNOSIS — I10 Essential (primary) hypertension: Secondary | ICD-10-CM | POA: Diagnosis not present

## 2023-06-30 DIAGNOSIS — I129 Hypertensive chronic kidney disease with stage 1 through stage 4 chronic kidney disease, or unspecified chronic kidney disease: Secondary | ICD-10-CM | POA: Diagnosis not present

## 2023-06-30 DIAGNOSIS — I480 Paroxysmal atrial fibrillation: Secondary | ICD-10-CM | POA: Diagnosis not present

## 2023-06-30 DIAGNOSIS — D849 Immunodeficiency, unspecified: Secondary | ICD-10-CM | POA: Diagnosis not present

## 2023-06-30 DIAGNOSIS — N1831 Chronic kidney disease, stage 3a: Secondary | ICD-10-CM | POA: Diagnosis not present

## 2023-06-30 DIAGNOSIS — Z942 Lung transplant status: Secondary | ICD-10-CM | POA: Diagnosis not present

## 2023-07-13 DIAGNOSIS — D849 Immunodeficiency, unspecified: Secondary | ICD-10-CM | POA: Diagnosis not present

## 2023-07-13 DIAGNOSIS — N1831 Chronic kidney disease, stage 3a: Secondary | ICD-10-CM | POA: Diagnosis not present

## 2023-07-13 DIAGNOSIS — I480 Paroxysmal atrial fibrillation: Secondary | ICD-10-CM | POA: Diagnosis not present

## 2023-07-13 DIAGNOSIS — K208 Other esophagitis without bleeding: Secondary | ICD-10-CM | POA: Diagnosis not present

## 2023-07-13 DIAGNOSIS — K209 Esophagitis, unspecified without bleeding: Secondary | ICD-10-CM | POA: Diagnosis not present

## 2023-07-13 DIAGNOSIS — Z79899 Other long term (current) drug therapy: Secondary | ICD-10-CM | POA: Diagnosis not present

## 2023-07-13 DIAGNOSIS — I129 Hypertensive chronic kidney disease with stage 1 through stage 4 chronic kidney disease, or unspecified chronic kidney disease: Secondary | ICD-10-CM | POA: Diagnosis not present

## 2023-07-13 DIAGNOSIS — Z942 Lung transplant status: Secondary | ICD-10-CM | POA: Diagnosis not present

## 2023-07-13 DIAGNOSIS — Z7982 Long term (current) use of aspirin: Secondary | ICD-10-CM | POA: Diagnosis not present

## 2023-07-13 DIAGNOSIS — K3189 Other diseases of stomach and duodenum: Secondary | ICD-10-CM | POA: Diagnosis not present

## 2023-07-13 DIAGNOSIS — D631 Anemia in chronic kidney disease: Secondary | ICD-10-CM | POA: Diagnosis not present

## 2023-07-13 DIAGNOSIS — Z96653 Presence of artificial knee joint, bilateral: Secondary | ICD-10-CM | POA: Diagnosis not present

## 2023-07-13 DIAGNOSIS — I10 Essential (primary) hypertension: Secondary | ICD-10-CM | POA: Diagnosis not present

## 2023-07-13 DIAGNOSIS — Z886 Allergy status to analgesic agent status: Secondary | ICD-10-CM | POA: Diagnosis not present

## 2023-07-13 DIAGNOSIS — T86818 Other complications of lung transplant: Secondary | ICD-10-CM | POA: Diagnosis not present

## 2023-07-13 DIAGNOSIS — K449 Diaphragmatic hernia without obstruction or gangrene: Secondary | ICD-10-CM | POA: Diagnosis not present

## 2023-07-16 DIAGNOSIS — J8410 Pulmonary fibrosis, unspecified: Secondary | ICD-10-CM | POA: Diagnosis not present

## 2023-07-16 DIAGNOSIS — R059 Cough, unspecified: Secondary | ICD-10-CM | POA: Diagnosis not present

## 2023-07-16 DIAGNOSIS — R918 Other nonspecific abnormal finding of lung field: Secondary | ICD-10-CM | POA: Diagnosis not present

## 2023-07-16 DIAGNOSIS — N1831 Chronic kidney disease, stage 3a: Secondary | ICD-10-CM | POA: Diagnosis not present

## 2023-07-16 DIAGNOSIS — M1611 Unilateral primary osteoarthritis, right hip: Secondary | ICD-10-CM | POA: Diagnosis not present

## 2023-07-16 DIAGNOSIS — I2510 Atherosclerotic heart disease of native coronary artery without angina pectoris: Secondary | ICD-10-CM | POA: Diagnosis not present

## 2023-07-16 DIAGNOSIS — I129 Hypertensive chronic kidney disease with stage 1 through stage 4 chronic kidney disease, or unspecified chronic kidney disease: Secondary | ICD-10-CM | POA: Diagnosis not present

## 2023-07-16 DIAGNOSIS — E1122 Type 2 diabetes mellitus with diabetic chronic kidney disease: Secondary | ICD-10-CM | POA: Diagnosis not present

## 2023-07-16 DIAGNOSIS — N183 Chronic kidney disease, stage 3 unspecified: Secondary | ICD-10-CM | POA: Diagnosis not present

## 2023-07-16 DIAGNOSIS — D84821 Immunodeficiency due to drugs: Secondary | ICD-10-CM | POA: Diagnosis not present

## 2023-07-16 DIAGNOSIS — T86818 Other complications of lung transplant: Secondary | ICD-10-CM | POA: Diagnosis not present

## 2023-07-16 DIAGNOSIS — Z5181 Encounter for therapeutic drug level monitoring: Secondary | ICD-10-CM | POA: Diagnosis not present

## 2023-07-16 DIAGNOSIS — E785 Hyperlipidemia, unspecified: Secondary | ICD-10-CM | POA: Diagnosis not present

## 2023-07-16 DIAGNOSIS — Z79899 Other long term (current) drug therapy: Secondary | ICD-10-CM | POA: Diagnosis not present

## 2023-07-16 DIAGNOSIS — Z942 Lung transplant status: Secondary | ICD-10-CM | POA: Diagnosis not present

## 2023-07-16 DIAGNOSIS — J84112 Idiopathic pulmonary fibrosis: Secondary | ICD-10-CM | POA: Diagnosis not present

## 2023-07-16 DIAGNOSIS — Z79624 Long term (current) use of inhibitors of nucleotide synthesis: Secondary | ICD-10-CM | POA: Diagnosis not present

## 2023-07-16 DIAGNOSIS — Z96653 Presence of artificial knee joint, bilateral: Secondary | ICD-10-CM | POA: Diagnosis not present

## 2023-07-21 DIAGNOSIS — C44329 Squamous cell carcinoma of skin of other parts of face: Secondary | ICD-10-CM | POA: Diagnosis not present

## 2023-07-21 HISTORY — PX: MOHS SURGERY: SUR867

## 2023-07-22 DIAGNOSIS — D235 Other benign neoplasm of skin of trunk: Secondary | ICD-10-CM | POA: Diagnosis not present

## 2023-07-22 DIAGNOSIS — L579 Skin changes due to chronic exposure to nonionizing radiation, unspecified: Secondary | ICD-10-CM | POA: Diagnosis not present

## 2023-07-22 DIAGNOSIS — C44329 Squamous cell carcinoma of skin of other parts of face: Secondary | ICD-10-CM | POA: Diagnosis not present

## 2023-07-22 DIAGNOSIS — D485 Neoplasm of uncertain behavior of skin: Secondary | ICD-10-CM | POA: Diagnosis not present

## 2023-07-22 DIAGNOSIS — Z85828 Personal history of other malignant neoplasm of skin: Secondary | ICD-10-CM | POA: Diagnosis not present

## 2023-07-22 DIAGNOSIS — D225 Melanocytic nevi of trunk: Secondary | ICD-10-CM | POA: Diagnosis not present

## 2023-07-22 DIAGNOSIS — L814 Other melanin hyperpigmentation: Secondary | ICD-10-CM | POA: Diagnosis not present

## 2023-07-22 DIAGNOSIS — L821 Other seborrheic keratosis: Secondary | ICD-10-CM | POA: Diagnosis not present

## 2023-07-22 DIAGNOSIS — L570 Actinic keratosis: Secondary | ICD-10-CM | POA: Diagnosis not present

## 2023-07-28 DIAGNOSIS — C44329 Squamous cell carcinoma of skin of other parts of face: Secondary | ICD-10-CM | POA: Diagnosis not present

## 2023-08-10 ENCOUNTER — Encounter: Payer: Self-pay | Admitting: Family Medicine

## 2023-08-10 ENCOUNTER — Ambulatory Visit (INDEPENDENT_AMBULATORY_CARE_PROVIDER_SITE_OTHER): Payer: Medicare Other | Admitting: Family Medicine

## 2023-08-10 VITALS — BP 119/63 | HR 71 | Temp 98.0°F | Ht 68.0 in | Wt 173.0 lb

## 2023-08-10 DIAGNOSIS — N400 Enlarged prostate without lower urinary tract symptoms: Secondary | ICD-10-CM

## 2023-08-10 DIAGNOSIS — Z942 Lung transplant status: Secondary | ICD-10-CM

## 2023-08-10 DIAGNOSIS — M8589 Other specified disorders of bone density and structure, multiple sites: Secondary | ICD-10-CM | POA: Diagnosis not present

## 2023-08-10 DIAGNOSIS — R7303 Prediabetes: Secondary | ICD-10-CM

## 2023-08-10 DIAGNOSIS — Z23 Encounter for immunization: Secondary | ICD-10-CM

## 2023-08-10 DIAGNOSIS — E782 Mixed hyperlipidemia: Secondary | ICD-10-CM | POA: Diagnosis not present

## 2023-08-10 DIAGNOSIS — I10 Essential (primary) hypertension: Secondary | ICD-10-CM | POA: Diagnosis not present

## 2023-08-10 LAB — BAYER DCA HB A1C WAIVED: HB A1C (BAYER DCA - WAIVED): 5.5 % (ref 4.8–5.6)

## 2023-08-10 NOTE — Progress Notes (Signed)
Subjective:  Patient ID: Vernon Richard, male    DOB: 1951/08/19  Age: 72 y.o. MRN: [id number]  CC: Medical Management of Chronic Issues   HPI [name] presents for  follow-up of hypertension. Patient has no history of headache chest pain or shortness of breath or recent cough. Patient also denies symptoms of TIA such as focal numbness or weakness. Patient denies side effects from medication. States taking it regularly.   in for follow-up of elevated cholesterol. Doing well without complaints on current medication. Denies side effects of statin including myalgia and arthralgia and nausea. Currently no chest pain, shortness of breath or other cardiovascular related symptoms noted.  Pt. Cannot take prolia, per transplant doctor. Still taking antiregection tacrolimus and mycophenolate. No dyspnea. Noted to have osteopenia on DEXA 2 years ago. Due for recheck soon.  History Vernon Richard has a past medical history of Arthritis, Heart murmur, systolic, Hyperlipidemia, Hypertension, and Pulmonary fibrosis (HCC) (05/03/2018).   He has a past surgical history that includes Hernia repair; Cataract extraction, bilateral; Lung transplant, single (Right, 09/15/2018); Joint replacement (Bilateral); Hip Arthroplasty (Right); Mohs surgery (02/03/2023); and Mohs surgery (Right, 07/2023).   His family history includes Cancer in his father and mother.He reports that he has never smoked. He has never used smokeless tobacco. He reports that he does not drink alcohol and does not use drugs.  Current Outpatient Medications on File Prior to Visit  Medication Sig Dispense Refill   acetaminophen (TYLENOL) 500 MG tablet Take by mouth.     acyclovir (ZOVIRAX) 200 MG capsule Take 200 mg by mouth 2 (two) times daily.     amLODipine (NORVASC) 5 MG tablet Take 1 tablet (5 mg total) by mouth daily. 90 tablet 3   aspirin 81 MG tablet Take 81 mg by mouth daily.     Calcium Citrate-Vitamin D 200-250 MG-UNIT TABS Take by mouth.      cetirizine (ZYRTEC) 10 MG tablet Take 10 mg by mouth daily.  2   fluticasone (FLONASE) 50 MCG/ACT nasal spray Place 2 sprays into both nostrils daily. 48 mL 3   MAGNESIUM GLYCINATE PO Take 800 mg by mouth 2 (two) times daily.     Multiple Vitamin (MULTI-VITAMIN) tablet Take by mouth.     Mycophenolate Mofetil (CELLCEPT PO) Take by mouth.     pantoprazole (PROTONIX) 40 MG tablet Take 1 tablet (40 mg total) by mouth daily. 90 tablet 3   polyethylene glycol powder (GLYCOLAX/MIRALAX) 17 GM/SCOOP powder Take by mouth.     predniSONE (DELTASONE) 5 MG tablet Take 5 mg by mouth daily with breakfast.     rosuvastatin (CRESTOR) 20 MG tablet Take 1 tablet (20 mg total) by mouth daily. For cholesterol 90 tablet 3   Tacrolimus ER 1 MG TB24 08/23/19 Take three- 1 mg tabs daily (total of 3 mg).     No current facility-administered medications on file prior to visit.    ROS Review of Systems  Constitutional:  Negative for fever.  Respiratory:  Negative for shortness of breath.   Cardiovascular:  Negative for chest pain.  Musculoskeletal:  Negative for arthralgias.  Skin:  Negative for rash.    Objective:  BP 119/63   Pulse 71   Temp 98 F (36.7 C)   Ht 5\' 8"  (1.727 m)   Wt 173 lb (78.5 kg)   SpO2 96%   BMI 26.30 kg/m   BP Readings from Last 3 Encounters:  08/10/23 119/63  02/04/23 117/69  08/07/22 120/68  Wt Readings from Last 3 Encounters:  08/10/23 173 lb (78.5 kg)  02/04/23 167 lb 3.2 oz (75.8 kg)  08/29/22 168 lb (76.2 kg)     Physical Exam Vitals reviewed.  Constitutional:      Appearance: He is well-developed.  HENT:     Head: Normocephalic and atraumatic.     Right Ear: External ear normal.     Left Ear: External ear normal.     Mouth/Throat:     Pharynx: No oropharyngeal exudate or posterior oropharyngeal erythema.  Eyes:     Pupils: Pupils are equal, round, and reactive to light.  Cardiovascular:     Rate and Rhythm: Normal rate and regular rhythm.     Heart  sounds: No murmur heard. Pulmonary:     Effort: No respiratory distress.     Breath sounds: Normal breath sounds.  Musculoskeletal:     Cervical back: Normal range of motion and neck supple.  Neurological:     Mental Status: He is alert and oriented to person, place, and time.       Assessment & Plan:   [name] was seen today for medical management of chronic issues.  Diagnoses and all orders for this visit:  Prediabetes -     Bayer DCA Hb A1c Waived  Mixed hyperlipidemia -     Lipid panel  Essential hypertension -     CBC with Differential/Platelet -     CMP14+EGFR  Benign prostatic hyperplasia without lower urinary tract symptoms -     PSA, total and free  Osteopenia of multiple sites -     DG Bone Density; Future  Status post lung transplantation (HCC)   Allergies as of 08/10/2023       Reactions   Duloxetine Other (See Comments)   Made his arhritis worse.    Nsaids Other (See Comments)   Transplant        Medication List        Accurate as of August 10, 2023  8:48 AM. If you have any questions, ask your nurse or doctor.          STOP taking these medications    cephALEXin 500 MG capsule Commonly known as: KEFLEX Stopped by: Broadus John Jamya Starry   denosumab 60 MG/ML Sosy injection Commonly known as: Secretary/administrator by: Laurisa Sahakian       TAKE these medications    acetaminophen 500 MG tablet Commonly known as: TYLENOL Take by mouth.   acyclovir 200 MG capsule Commonly known as: ZOVIRAX Take 200 mg by mouth 2 (two) times daily.   amLODipine 5 MG tablet Commonly known as: NORVASC Take 1 tablet (5 mg total) by mouth daily.   aspirin 81 MG tablet Take 81 mg by mouth daily.   Calcium Citrate-Vitamin D 200-250 MG-UNIT Tabs Take by mouth.   CELLCEPT PO Take by mouth.   cetirizine 10 MG tablet Commonly known as: ZYRTEC Take 10 mg by mouth daily.   fluticasone 50 MCG/ACT nasal spray Commonly known as: FLONASE Place 2 sprays into  both nostrils daily.   MAGNESIUM GLYCINATE PO Take 800 mg by mouth 2 (two) times daily.   Multi-Vitamin tablet Take by mouth.   pantoprazole 40 MG tablet Commonly known as: PROTONIX Take 1 tablet (40 mg total) by mouth daily.   polyethylene glycol powder 17 GM/SCOOP powder Commonly known as: GLYCOLAX/MIRALAX Take by mouth.   predniSONE 5 MG tablet Commonly known as: DELTASONE Take 5 mg by mouth daily with breakfast.  rosuvastatin 20 MG tablet Commonly known as: Crestor Take 1 tablet (20 mg total) by mouth daily. For cholesterol   tacrolimus ER 1 MG Tb24 Commonly known as: ENVARSUS XR 08/23/19 Take three- 1 mg tabs daily (total of 3 mg).        No orders of the defined types were placed in this encounter.   Transplant pt. Stable for multiple conditions, no signs of rejection grossly.   Follow-up: Return in about 6 months (around 02/08/2024) for [facility] physical.  Mechele Claude, M.D.

## 2023-08-11 LAB — CBC WITH DIFFERENTIAL/PLATELET
Basophils Absolute: 0.1 10*3/uL (ref 0.0–0.2)
Basos: 1 %
EOS (ABSOLUTE): 0.2 10*3/uL (ref 0.0–0.4)
Eos: 4 %
Hematocrit: 39 % (ref 37.5–51.0)
Hemoglobin: 12 g/dL — ABNORMAL LOW (ref 13.0–17.7)
Immature Grans (Abs): 0.2 10*3/uL — ABNORMAL HIGH (ref 0.0–0.1)
Immature Granulocytes: 4 %
Lymphocytes Absolute: 0.3 10*3/uL — ABNORMAL LOW (ref 0.7–3.1)
Lymphs: 8 %
MCH: 26.5 pg — ABNORMAL LOW (ref 26.6–33.0)
MCHC: 30.8 g/dL — ABNORMAL LOW (ref 31.5–35.7)
MCV: 86 fL (ref 79–97)
Monocytes Absolute: 0.6 10*3/uL (ref 0.1–0.9)
Monocytes: 13 %
Neutrophils Absolute: 3.2 10*3/uL (ref 1.4–7.0)
Neutrophils: 70 %
Platelets: 208 10*3/uL (ref 150–450)
RBC: 4.52 x10E6/uL (ref 4.14–5.80)
RDW: 14.4 % (ref 11.6–15.4)
WBC: 4.5 10*3/uL (ref 3.4–10.8)

## 2023-08-11 LAB — CMP14+EGFR
ALT: 12 IU/L (ref 0–44)
AST: 17 [IU]/L (ref 0–40)
Albumin: 4.2 g/dL (ref 3.8–4.8)
Alkaline Phosphatase: 41 IU/L — ABNORMAL LOW (ref 44–121)
BUN/Creatinine Ratio: 12 (ref 10–24)
BUN: 18 mg/dL (ref 8–27)
Bilirubin Total: 0.4 mg/dL (ref 0.0–1.2)
CO2: 22 mmol/L (ref 20–29)
Calcium: 9.1 mg/dL (ref 8.6–10.2)
Chloride: 107 mmol/L — ABNORMAL HIGH (ref 96–106)
Creatinine, Ser: 1.54 mg/dL — ABNORMAL HIGH (ref 0.76–1.27)
Globulin, Total: 1.7 g/dL (ref 1.5–4.5)
Glucose: 91 mg/dL (ref 70–99)
Potassium: 4.3 mmol/L (ref 3.5–5.2)
Sodium: 145 mmol/L — ABNORMAL HIGH (ref 134–144)
Total Protein: 5.9 g/dL — ABNORMAL LOW (ref 6.0–8.5)
eGFR: 47 mL/min/{1.73_m2} — ABNORMAL LOW (ref >59)

## 2023-08-11 LAB — LIPID PANEL
Chol/HDL Ratio: 2.5 ratio (ref 0.0–5.0)
Cholesterol, Total: 162 mg/dL (ref 100–199)
HDL: 65 mg/dL (ref >39)
LDL Chol Calc (NIH): 68 mg/dL (ref 0–99)
Triglycerides: 176 mg/dL — ABNORMAL HIGH (ref 0–149)
VLDL Cholesterol Cal: 29 mg/dL (ref 5–40)

## 2023-08-12 LAB — PSA: Prostate Specific Ag, Serum: 0.4 ng/mL (ref 0.0–4.0)

## 2023-08-12 LAB — SPECIMEN STATUS REPORT

## 2023-08-13 DIAGNOSIS — T86819 Unspecified complication of lung transplant: Secondary | ICD-10-CM | POA: Diagnosis not present

## 2023-08-13 DIAGNOSIS — Z942 Lung transplant status: Secondary | ICD-10-CM | POA: Diagnosis not present

## 2023-08-13 DIAGNOSIS — Z79899 Other long term (current) drug therapy: Secondary | ICD-10-CM | POA: Diagnosis not present

## 2023-08-13 DIAGNOSIS — Z2989 Encounter for other specified prophylactic measures: Secondary | ICD-10-CM | POA: Diagnosis not present

## 2023-08-25 ENCOUNTER — Other Ambulatory Visit: Payer: Medicare Other

## 2023-08-25 DIAGNOSIS — C44329 Squamous cell carcinoma of skin of other parts of face: Secondary | ICD-10-CM | POA: Diagnosis not present

## 2023-08-31 ENCOUNTER — Ambulatory Visit (INDEPENDENT_AMBULATORY_CARE_PROVIDER_SITE_OTHER): Payer: Medicare Other

## 2023-08-31 VITALS — Ht 68.0 in | Wt 168.0 lb

## 2023-08-31 DIAGNOSIS — Z0000 Encounter for general adult medical examination without abnormal findings: Secondary | ICD-10-CM | POA: Diagnosis not present

## 2023-08-31 NOTE — Patient Instructions (Signed)
Vernon Richard , Thank you for taking time to come for your Medicare Wellness Visit. I appreciate your ongoing commitment to your health goals. Please review the following plan we discussed and let me know if I can assist you in the future.   Referrals/Orders/Follow-Ups/Clinician Recommendations: Aim for 30 minutes of exercise or brisk walking, 6-8 glasses of water, and 5 servings of fruits and vegetables each day.   This is a list of the screening recommended for you and due dates:  Health Maintenance  Topic Date Due   COVID-19 Vaccine (4 - 2023-24 season) 06/21/2023   Colon Cancer Screening  10/04/2023   Medicare Annual Wellness Visit  08/30/2024   DTaP/Tdap/Td vaccine (3 - Td or Tdap) 08/07/2032   Pneumonia Vaccine  Completed   Flu Shot  Completed   Hepatitis C Screening  Completed   Zoster (Shingles) Vaccine  Completed   HPV Vaccine  Aged Out    Advanced directives: (Copy Requested) Please bring a copy of your health care power of attorney and living will to the office to be added to your chart at your convenience.  Next Medicare Annual Wellness Visit scheduled for next year: Yes  insert Preventive Care attachment Insert FALL PREVENTION attachment if needed

## 2023-08-31 NOTE — Progress Notes (Signed)
Subjective:   Vernon Richard is a 72 y.o. male who presents for Medicare Annual/Subsequent preventive examination.  Visit Complete: Virtual I connected with  Vernon Richard on 08/31/23 by a audio enabled telemedicine application and verified that I am speaking with the correct person using two identifiers.  Patient Location: Home  Provider Location: Home Office  I discussed the limitations of evaluation and management by telemedicine. The patient expressed understanding and agreed to proceed.  Vital Signs: Because this visit was a virtual/telehealth visit, some criteria may be missing or patient reported. Any vitals not documented were not able to be obtained and vitals that have been documented are patient reported.  Patient Medicare AWV questionnaire was completed by the patient on 08/31/2023; I have confirmed that all information answered by patient is correct and no changes since this date.  Cardiac Risk Factors include: advanced age (>30men, >46 women);dyslipidemia;male gender     Objective:    Today's Vitals   08/31/23 1059  Weight: 168 lb (76.2 kg)  Height: 5\' 8"  (1.727 m)   Body mass index is 25.54 kg/m.     08/31/2023   11:03 AM 08/29/2022   10:34 AM 08/01/2021    3:33 PM 03/26/2021    9:30 AM 05/27/2018   11:32 AM  Advanced Directives  Does Patient Have a Medical Advance Directive? Yes No Yes Yes Yes  Type of Estate agent of Vernon Richard;Living will  Healthcare Power of [place];Living will  Healthcare Power of [place];Living will  Copy of Healthcare Power of Attorney in Chart? No - copy requested  No - copy requested  No - copy requested  Would patient like information on creating a medical advance directive?  No - Patient declined       Current Medications (verified) Outpatient Encounter Medications as of 08/31/2023  Medication Sig   acetaminophen (TYLENOL) 500 MG tablet Take by mouth.   acyclovir (ZOVIRAX) 200 MG capsule Take 200 mg by mouth 2  (two) times daily.   amLODipine (NORVASC) 5 MG tablet Take 1 tablet (5 mg total) by mouth daily.   aspirin 81 MG tablet Take 81 mg by mouth daily.   Calcium Citrate-Vitamin D 200-250 MG-UNIT TABS Take by mouth.   cetirizine (ZYRTEC) 10 MG tablet Take 10 mg by mouth daily.   fluticasone (FLONASE) 50 MCG/ACT nasal spray Place 2 sprays into both nostrils daily.   MAGNESIUM GLYCINATE PO Take 800 mg by mouth 2 (two) times daily.   Multiple Vitamin (MULTI-VITAMIN) tablet Take by mouth.   Mycophenolate Mofetil (CELLCEPT PO) Take by mouth.   pantoprazole (PROTONIX) 40 MG tablet Take 1 tablet (40 mg total) by mouth daily.   polyethylene glycol powder (GLYCOLAX/MIRALAX) 17 GM/SCOOP powder Take by mouth.   predniSONE (DELTASONE) 5 MG tablet Take 5 mg by mouth daily with breakfast.   rosuvastatin (CRESTOR) 20 MG tablet Take 1 tablet (20 mg total) by mouth daily. For cholesterol   Tacrolimus ER 1 MG TB24 08/23/19 Take three- 1 mg tabs daily (total of 3 mg).   No facility-administered encounter medications on file as of 08/31/2023.    Allergies (verified) Duloxetine and Nsaids   History: Past Medical History:  Diagnosis Date   Arthritis    OA of both knees   Heart murmur, systolic    Hyperlipidemia    Hypertension    Pulmonary fibrosis (HCC) 05/03/2018   Past Surgical History:  Procedure Laterality Date   CATARACT EXTRACTION, BILATERAL     HERNIA REPAIR  groin   HIP ARTHROPLASTY Right    JOINT REPLACEMENT Bilateral    knees   LUNG TRANSPLANT, SINGLE Right 09/15/2018   MOHS SURGERY  02/03/2023   face, left side above lip   MOHS SURGERY Right 07/2023   behind right ear   Family History  Problem Relation Age of Onset   Cancer Mother    Cancer Father    Social History   Socioeconomic History   Marital status: Married    Spouse name: Not on file   Number of children: 1   Years of education: Not on file   Highest education level: 12th grade  Occupational History   Occupation:  Farm  Tobacco Use   Smoking status: Never   Smokeless tobacco: Never  Vaping Use   Vaping status: Never Used  Substance and Sexual Activity   Alcohol use: No   Drug use: No   Sexual activity: Not Currently  Other Topics Concern   Not on file  Social History Narrative   Married 1 child   S/p lung transplant   Social Determinants of Health   Financial Resource Strain: Low Risk  (08/31/2023)   Overall Financial Resource Strain (CARDIA)    Difficulty of Paying Living Expenses: Not hard at all  Food Insecurity: No Food Insecurity (08/31/2023)   Hunger Vital Sign    Worried About Running Out of Food in the Last Year: Never true    Ran Out of Food in the Last Year: Never true  Transportation Needs: No Transportation Needs (08/31/2023)   PRAPARE - Administrator, Civil Service (Medical): No    Lack of Transportation (Non-Medical): No  Physical Activity: Insufficiently Active (08/31/2023)   Exercise Vital Sign    Days of Exercise per Week: 3 days    Minutes of Exercise per Session: 30 min  Stress: No Stress Concern Present (08/31/2023)   [employer] of Occupational Health - Occupational Stress Questionnaire    Feeling of Stress : Not at all  Social Connections: Moderately Integrated (08/31/2023)   Social Connection and Isolation Panel [NHANES]    Frequency of Communication with Friends and Family: More than three times a week    Frequency of Social Gatherings with Friends and Family: More than three times a week    Attends Religious Services: More than 4 times per year    Active Member of [employer] or Organizations: No    Attends Engineer, structural: Never    Marital Status: Married    Tobacco Counseling Counseling given: Not Answered   Clinical Intake:  Pre-visit preparation completed: Yes  Pain : No/denies pain     Nutritional Risks: None Diabetes: No  How often do you need to have someone help you when you read instructions, pamphlets, or  other written materials from your doctor or pharmacy?: 1 - Never  Interpreter Needed?: No  Information entered by :: Renie Ora, LPN   Activities of Daily Living    08/31/2023   11:03 AM  In your present state of health, do you have any difficulty performing the following activities:  Hearing? 0  Vision? 0  Difficulty concentrating or making decisions? 0  Walking or climbing stairs? 0  Dressing or bathing? 0  Doing errands, shopping? 0  Preparing Food and eating ? N  Using the Toilet? N  In the past six months, have you accidently leaked urine? N  Do you have problems with loss of bowel control? N  Managing your Medications? N  Managing your Finances? N  Housekeeping or managing your Housekeeping? N    Patient Care Team: Mechele Claude, MD as PCP - General (Family Medicine)  Indicate any recent Medical Services you may have received from other than [facility] providers in the past year (date may be approximate).     Assessment:   This is a routine wellness examination for Akio.  Hearing/Vision screen Vision Screening - Comments:: Wears rx glasses - up to date with routine eye exams with  [facility]    Goals Addressed             This Visit's Progress    DIET - EAT MORE FRUITS AND VEGETABLES   On track      Depression Screen    08/31/2023   11:01 AM 08/10/2023    7:59 AM 02/04/2023    8:22 AM 08/29/2022   10:33 AM 08/07/2022    8:13 AM 02/05/2022    8:00 AM 08/07/2021   10:31 AM  PHQ 2/9 Scores  PHQ - 2 Score 0 0 0 0 0 0 0  PHQ- 9 Score    0 4      Fall Risk    08/31/2023   11:00 AM 08/10/2023    7:59 AM 02/04/2023    8:22 AM 08/29/2022   10:31 AM 02/05/2022    8:00 AM  Fall Risk   Falls in the past year? 0 0 0 0 0  Number falls in past yr: 0   0   Injury with Fall? 0   0   Risk for fall due to : No Fall Risks   No Fall Risks   Follow up Falls prevention discussed   Falls prevention discussed     MEDICARE RISK AT HOME: Medicare Risk at  Home Any stairs in or around the home?: Yes If so, are there any without handrails?: No Home free of loose throw rugs in walkways, pet beds, electrical cords, etc?: Yes Adequate lighting in your home to reduce risk of falls?: Yes Life alert?: No Use of a cane, walker or w/c?: No Grab bars in the bathroom?: Yes Shower chair or bench in shower?: Yes Elevated toilet seat or a handicapped toilet?: Yes  TIMED UP AND GO:  Was the test performed?  No    Cognitive Function:    05/27/2018   11:33 AM  MMSE - Mini Mental State Exam  Orientation to time 5  Orientation to Place 5  Registration 3  Attention/ Calculation 5  Recall 3  Language- name 2 objects 2  Language- repeat 1  Language- follow 3 step command 3  Language- read & follow direction 1  Write a sentence 1  Copy design 1  Total score 30        08/31/2023   11:03 AM 08/29/2022   10:34 AM 08/01/2021    3:40 PM  6CIT Screen  What Year? 0 points 0 points 0 points  What month? 0 points 0 points 0 points  What time? 0 points 0 points 0 points  Count back from 20 0 points 0 points 0 points  Months in reverse 0 points 0 points 0 points  Repeat phrase 0 points 0 points 0 points  Total Score 0 points 0 points 0 points    Immunizations Immunization History  Administered Date(s) Administered   Fluad Quad(high Dose 65+) 08/07/2021, 08/07/2022   Fluad Trivalent(High Dose 65+) 08/10/2023   Hepatitis B, ADULT 05/06/2018, 06/03/2018, 08/17/2018   Influenza, High Dose  Seasonal PF 09/21/2017, 08/03/2018, 07/21/2019   Influenza, Seasonal, Injecte, Preservative Fre 07/19/2013, 09/13/2014, 09/18/2015, 09/17/2016   Influenza,inj,Quad PF,6+ Mos 07/19/2013, 09/13/2014, 09/18/2015, 09/17/2016   Moderna Sars-Covid-2 Vaccination 11/12/2019, 03/27/2020, 07/12/2020   Pneumococcal Conjugate-13 03/17/2017   Pneumococcal Polysaccharide-23 05/06/2018   Tdap 07/26/2012, 08/07/2022   Zoster Recombinant(Shingrix) 06/03/2018, 02/05/2022     TDAP status: Up to date  Flu Vaccine status: Up to date  Pneumococcal vaccine status: Up to date  Covid-19 vaccine status: Completed vaccines  Qualifies for Shingles Vaccine? Yes   Zostavax completed Yes   Shingrix Completed?: Yes  Screening Tests Health Maintenance  Topic Date Due   COVID-19 Vaccine (4 - 2023-24 season) 06/21/2023   Colonoscopy  10/04/2023   Medicare Annual Wellness (AWV)  08/30/2024   DTaP/Tdap/Td (3 - Td or Tdap) 08/07/2032   Pneumonia Vaccine 29+ Years old  Completed   INFLUENZA VACCINE  Completed   Hepatitis C Screening  Completed   Zoster Vaccines- Shingrix  Completed   HPV VACCINES  Aged Out    Health Maintenance  Health Maintenance Due  Topic Date Due   COVID-19 Vaccine (4 - 2023-24 season) 06/21/2023   Colonoscopy  10/04/2023    Colorectal cancer screening: Type of screening: Colonoscopy. Completed 10/03/2018. Repeat every 5 years  Lung Cancer Screening: (Low Dose CT Chest recommended if Age 87-80 years, 20 pack-year currently smoking OR have quit w/in 15years.) does not qualify.   Lung Cancer Screening Referral: n/a  Additional Screening:  Hepatitis C Screening: does not qualify; Completed 03/17/2017  Vision Screening: Recommended annual ophthalmology exams for early detection of glaucoma and other disorders of the eye. Is the patient up to date with their annual eye exam?  Yes  Who is the provider or what is the name of the office in which the patient attends annual eye exams? [facility]  If pt is not established with a provider, would they like to be referred to a provider to establish care? No .   Dental Screening: Recommended annual dental exams for proper oral hygiene   Community Resource Referral / Chronic Care Management: CRR required this visit?  No   CCM required this visit?  No     Plan:     I have personally reviewed and noted the following in the patient's chart:   Medical and social history Use of  alcohol, tobacco or illicit drugs  Current medications and supplements including opioid prescriptions. Patient is not currently taking opioid prescriptions. Functional ability and status Nutritional status Physical activity Advanced directives List of other physicians Hospitalizations, surgeries, and ER visits in previous 12 months Vitals Screenings to include cognitive, depression, and falls Referrals and appointments  In addition, I have reviewed and discussed with patient certain preventive protocols, quality metrics, and best practice recommendations. A written personalized care plan for preventive services as well as general preventive health recommendations were provided to patient.     Lorrene Reid, LPN   38/25/[id number]   After Visit Summary: (MyChart) Due to this being a telephonic visit, the after visit summary with patients personalized plan was offered to patient via MyChart   Nurse Notes: none

## 2023-09-14 DIAGNOSIS — T86819 Unspecified complication of lung transplant: Secondary | ICD-10-CM | POA: Diagnosis not present

## 2023-09-14 DIAGNOSIS — Z79899 Other long term (current) drug therapy: Secondary | ICD-10-CM | POA: Diagnosis not present

## 2023-09-14 DIAGNOSIS — Z5181 Encounter for therapeutic drug level monitoring: Secondary | ICD-10-CM | POA: Diagnosis not present

## 2023-09-14 DIAGNOSIS — Z942 Lung transplant status: Secondary | ICD-10-CM | POA: Diagnosis not present

## 2023-09-29 DIAGNOSIS — D0462 Carcinoma in situ of skin of left upper limb, including shoulder: Secondary | ICD-10-CM | POA: Diagnosis not present

## 2023-09-29 DIAGNOSIS — D0439 Carcinoma in situ of skin of other parts of face: Secondary | ICD-10-CM | POA: Diagnosis not present

## 2023-09-29 DIAGNOSIS — L814 Other melanin hyperpigmentation: Secondary | ICD-10-CM | POA: Diagnosis not present

## 2023-09-29 DIAGNOSIS — D235 Other benign neoplasm of skin of trunk: Secondary | ICD-10-CM | POA: Diagnosis not present

## 2023-09-29 DIAGNOSIS — L570 Actinic keratosis: Secondary | ICD-10-CM | POA: Diagnosis not present

## 2023-09-29 DIAGNOSIS — D225 Melanocytic nevi of trunk: Secondary | ICD-10-CM | POA: Diagnosis not present

## 2023-09-29 DIAGNOSIS — L821 Other seborrheic keratosis: Secondary | ICD-10-CM | POA: Diagnosis not present

## 2023-09-29 DIAGNOSIS — L579 Skin changes due to chronic exposure to nonionizing radiation, unspecified: Secondary | ICD-10-CM | POA: Diagnosis not present

## 2023-09-29 DIAGNOSIS — Z85828 Personal history of other malignant neoplasm of skin: Secondary | ICD-10-CM | POA: Diagnosis not present

## 2023-09-29 DIAGNOSIS — D485 Neoplasm of uncertain behavior of skin: Secondary | ICD-10-CM | POA: Diagnosis not present

## 2023-10-20 DIAGNOSIS — Z79899 Other long term (current) drug therapy: Secondary | ICD-10-CM | POA: Diagnosis not present

## 2023-10-20 DIAGNOSIS — Z942 Lung transplant status: Secondary | ICD-10-CM | POA: Diagnosis not present

## 2023-10-20 DIAGNOSIS — Z5181 Encounter for therapeutic drug level monitoring: Secondary | ICD-10-CM | POA: Diagnosis not present

## 2023-10-20 DIAGNOSIS — T86819 Unspecified complication of lung transplant: Secondary | ICD-10-CM | POA: Diagnosis not present

## 2023-11-04 DIAGNOSIS — T86819 Unspecified complication of lung transplant: Secondary | ICD-10-CM | POA: Diagnosis not present

## 2023-11-04 DIAGNOSIS — Z5181 Encounter for therapeutic drug level monitoring: Secondary | ICD-10-CM | POA: Diagnosis not present

## 2023-11-04 DIAGNOSIS — Z79899 Other long term (current) drug therapy: Secondary | ICD-10-CM | POA: Diagnosis not present

## 2023-11-04 DIAGNOSIS — Z942 Lung transplant status: Secondary | ICD-10-CM | POA: Diagnosis not present

## 2023-11-12 DIAGNOSIS — Z7982 Long term (current) use of aspirin: Secondary | ICD-10-CM | POA: Diagnosis not present

## 2023-11-12 DIAGNOSIS — Z886 Allergy status to analgesic agent status: Secondary | ICD-10-CM | POA: Diagnosis not present

## 2023-11-12 DIAGNOSIS — Z942 Lung transplant status: Secondary | ICD-10-CM | POA: Diagnosis not present

## 2023-11-12 DIAGNOSIS — E1122 Type 2 diabetes mellitus with diabetic chronic kidney disease: Secondary | ICD-10-CM | POA: Diagnosis not present

## 2023-11-12 DIAGNOSIS — Z5181 Encounter for therapeutic drug level monitoring: Secondary | ICD-10-CM | POA: Diagnosis not present

## 2023-11-12 DIAGNOSIS — J8410 Pulmonary fibrosis, unspecified: Secondary | ICD-10-CM | POA: Diagnosis not present

## 2023-11-12 DIAGNOSIS — I129 Hypertensive chronic kidney disease with stage 1 through stage 4 chronic kidney disease, or unspecified chronic kidney disease: Secondary | ICD-10-CM | POA: Diagnosis not present

## 2023-11-12 DIAGNOSIS — N183 Chronic kidney disease, stage 3 unspecified: Secondary | ICD-10-CM | POA: Diagnosis not present

## 2023-11-12 DIAGNOSIS — I480 Paroxysmal atrial fibrillation: Secondary | ICD-10-CM | POA: Diagnosis not present

## 2023-11-12 DIAGNOSIS — D84821 Immunodeficiency due to drugs: Secondary | ICD-10-CM | POA: Diagnosis not present

## 2023-11-12 DIAGNOSIS — Z4824 Encounter for aftercare following lung transplant: Secondary | ICD-10-CM | POA: Diagnosis not present

## 2023-11-12 DIAGNOSIS — Z79624 Long term (current) use of inhibitors of nucleotide synthesis: Secondary | ICD-10-CM | POA: Diagnosis not present

## 2023-11-12 DIAGNOSIS — R931 Abnormal findings on diagnostic imaging of heart and coronary circulation: Secondary | ICD-10-CM | POA: Diagnosis not present

## 2023-11-12 DIAGNOSIS — Z794 Long term (current) use of insulin: Secondary | ICD-10-CM | POA: Diagnosis not present

## 2023-11-12 DIAGNOSIS — Z7952 Long term (current) use of systemic steroids: Secondary | ICD-10-CM | POA: Diagnosis not present

## 2023-11-12 DIAGNOSIS — T86818 Other complications of lung transplant: Secondary | ICD-10-CM | POA: Diagnosis not present

## 2023-11-12 DIAGNOSIS — Z79899 Other long term (current) drug therapy: Secondary | ICD-10-CM | POA: Diagnosis not present

## 2023-11-24 DIAGNOSIS — L570 Actinic keratosis: Secondary | ICD-10-CM | POA: Diagnosis not present

## 2023-11-24 DIAGNOSIS — D0462 Carcinoma in situ of skin of left upper limb, including shoulder: Secondary | ICD-10-CM | POA: Diagnosis not present

## 2023-11-24 DIAGNOSIS — D0439 Carcinoma in situ of skin of other parts of face: Secondary | ICD-10-CM | POA: Diagnosis not present

## 2023-11-24 DIAGNOSIS — D044 Carcinoma in situ of skin of scalp and neck: Secondary | ICD-10-CM | POA: Diagnosis not present

## 2023-11-24 DIAGNOSIS — R918 Other nonspecific abnormal finding of lung field: Secondary | ICD-10-CM | POA: Diagnosis not present

## 2023-11-24 DIAGNOSIS — I288 Other diseases of pulmonary vessels: Secondary | ICD-10-CM | POA: Diagnosis not present

## 2023-11-24 DIAGNOSIS — Z79899 Other long term (current) drug therapy: Secondary | ICD-10-CM | POA: Diagnosis not present

## 2023-11-24 DIAGNOSIS — Z5181 Encounter for therapeutic drug level monitoring: Secondary | ICD-10-CM | POA: Diagnosis not present

## 2023-11-24 DIAGNOSIS — Z942 Lung transplant status: Secondary | ICD-10-CM | POA: Diagnosis not present

## 2023-11-24 DIAGNOSIS — I2510 Atherosclerotic heart disease of native coronary artery without angina pectoris: Secondary | ICD-10-CM | POA: Diagnosis not present

## 2023-11-24 DIAGNOSIS — D485 Neoplasm of uncertain behavior of skin: Secondary | ICD-10-CM | POA: Diagnosis not present

## 2023-11-24 DIAGNOSIS — T86819 Unspecified complication of lung transplant: Secondary | ICD-10-CM | POA: Diagnosis not present

## 2023-11-24 DIAGNOSIS — J8410 Pulmonary fibrosis, unspecified: Secondary | ICD-10-CM | POA: Diagnosis not present

## 2023-11-24 DIAGNOSIS — J948 Other specified pleural conditions: Secondary | ICD-10-CM | POA: Diagnosis not present

## 2023-12-06 DIAGNOSIS — I10 Essential (primary) hypertension: Secondary | ICD-10-CM | POA: Diagnosis not present

## 2023-12-06 DIAGNOSIS — R197 Diarrhea, unspecified: Secondary | ICD-10-CM | POA: Diagnosis not present

## 2023-12-06 DIAGNOSIS — R5383 Other fatigue: Secondary | ICD-10-CM | POA: Diagnosis not present

## 2023-12-06 DIAGNOSIS — R109 Unspecified abdominal pain: Secondary | ICD-10-CM | POA: Diagnosis not present

## 2023-12-06 DIAGNOSIS — E860 Dehydration: Secondary | ICD-10-CM | POA: Diagnosis not present

## 2023-12-06 DIAGNOSIS — Z1152 Encounter for screening for COVID-19: Secondary | ICD-10-CM | POA: Diagnosis not present

## 2023-12-06 DIAGNOSIS — Z942 Lung transplant status: Secondary | ICD-10-CM | POA: Diagnosis not present

## 2023-12-08 DIAGNOSIS — Z79899 Other long term (current) drug therapy: Secondary | ICD-10-CM | POA: Diagnosis not present

## 2023-12-08 DIAGNOSIS — Z5181 Encounter for therapeutic drug level monitoring: Secondary | ICD-10-CM | POA: Diagnosis not present

## 2023-12-08 DIAGNOSIS — T86819 Unspecified complication of lung transplant: Secondary | ICD-10-CM | POA: Diagnosis not present

## 2023-12-08 DIAGNOSIS — Z942 Lung transplant status: Secondary | ICD-10-CM | POA: Diagnosis not present

## 2023-12-13 DIAGNOSIS — D849 Immunodeficiency, unspecified: Secondary | ICD-10-CM | POA: Diagnosis not present

## 2023-12-13 DIAGNOSIS — Z942 Lung transplant status: Secondary | ICD-10-CM | POA: Diagnosis not present

## 2023-12-13 DIAGNOSIS — D801 Nonfamilial hypogammaglobulinemia: Secondary | ICD-10-CM | POA: Diagnosis not present

## 2024-01-12 DIAGNOSIS — L814 Other melanin hyperpigmentation: Secondary | ICD-10-CM | POA: Diagnosis not present

## 2024-01-12 DIAGNOSIS — L821 Other seborrheic keratosis: Secondary | ICD-10-CM | POA: Diagnosis not present

## 2024-01-12 DIAGNOSIS — L570 Actinic keratosis: Secondary | ICD-10-CM | POA: Diagnosis not present

## 2024-01-12 DIAGNOSIS — Z85828 Personal history of other malignant neoplasm of skin: Secondary | ICD-10-CM | POA: Diagnosis not present

## 2024-01-12 DIAGNOSIS — L579 Skin changes due to chronic exposure to nonionizing radiation, unspecified: Secondary | ICD-10-CM | POA: Diagnosis not present

## 2024-01-12 DIAGNOSIS — D225 Melanocytic nevi of trunk: Secondary | ICD-10-CM | POA: Diagnosis not present

## 2024-01-12 DIAGNOSIS — D235 Other benign neoplasm of skin of trunk: Secondary | ICD-10-CM | POA: Diagnosis not present

## 2024-01-12 DIAGNOSIS — Z5181 Encounter for therapeutic drug level monitoring: Secondary | ICD-10-CM | POA: Diagnosis not present

## 2024-01-12 DIAGNOSIS — Z942 Lung transplant status: Secondary | ICD-10-CM | POA: Diagnosis not present

## 2024-01-12 DIAGNOSIS — T86819 Unspecified complication of lung transplant: Secondary | ICD-10-CM | POA: Diagnosis not present

## 2024-01-12 DIAGNOSIS — Z79899 Other long term (current) drug therapy: Secondary | ICD-10-CM | POA: Diagnosis not present

## 2024-01-28 DIAGNOSIS — Z5181 Encounter for therapeutic drug level monitoring: Secondary | ICD-10-CM | POA: Diagnosis not present

## 2024-01-28 DIAGNOSIS — Z942 Lung transplant status: Secondary | ICD-10-CM | POA: Diagnosis not present

## 2024-01-28 DIAGNOSIS — T86818 Other complications of lung transplant: Secondary | ICD-10-CM | POA: Diagnosis not present

## 2024-01-28 DIAGNOSIS — D801 Nonfamilial hypogammaglobulinemia: Secondary | ICD-10-CM | POA: Diagnosis not present

## 2024-01-28 DIAGNOSIS — J8410 Pulmonary fibrosis, unspecified: Secondary | ICD-10-CM | POA: Diagnosis not present

## 2024-01-28 DIAGNOSIS — N1831 Chronic kidney disease, stage 3a: Secondary | ICD-10-CM | POA: Diagnosis not present

## 2024-01-28 DIAGNOSIS — T86810 Lung transplant rejection: Secondary | ICD-10-CM | POA: Diagnosis not present

## 2024-01-28 DIAGNOSIS — Z79899 Other long term (current) drug therapy: Secondary | ICD-10-CM | POA: Diagnosis not present

## 2024-01-28 DIAGNOSIS — R918 Other nonspecific abnormal finding of lung field: Secondary | ICD-10-CM | POA: Diagnosis not present

## 2024-01-28 DIAGNOSIS — I700 Atherosclerosis of aorta: Secondary | ICD-10-CM | POA: Diagnosis not present

## 2024-02-09 ENCOUNTER — Encounter: Payer: Self-pay | Admitting: Family Medicine

## 2024-02-09 ENCOUNTER — Ambulatory Visit: Payer: Medicare Other | Admitting: Family Medicine

## 2024-02-09 VITALS — BP 133/70 | HR 68 | Temp 97.8°F | Ht 68.0 in | Wt 170.0 lb

## 2024-02-09 DIAGNOSIS — M8589 Other specified disorders of bone density and structure, multiple sites: Secondary | ICD-10-CM

## 2024-02-09 DIAGNOSIS — M1990 Unspecified osteoarthritis, unspecified site: Secondary | ICD-10-CM | POA: Diagnosis not present

## 2024-02-09 DIAGNOSIS — E559 Vitamin D deficiency, unspecified: Secondary | ICD-10-CM | POA: Diagnosis not present

## 2024-02-09 DIAGNOSIS — I10 Essential (primary) hypertension: Secondary | ICD-10-CM

## 2024-02-09 DIAGNOSIS — R7303 Prediabetes: Secondary | ICD-10-CM | POA: Diagnosis not present

## 2024-02-09 DIAGNOSIS — E782 Mixed hyperlipidemia: Secondary | ICD-10-CM

## 2024-02-09 DIAGNOSIS — Z125 Encounter for screening for malignant neoplasm of prostate: Secondary | ICD-10-CM

## 2024-02-09 DIAGNOSIS — I2510 Atherosclerotic heart disease of native coronary artery without angina pectoris: Secondary | ICD-10-CM | POA: Diagnosis not present

## 2024-02-09 DIAGNOSIS — N400 Enlarged prostate without lower urinary tract symptoms: Secondary | ICD-10-CM | POA: Diagnosis not present

## 2024-02-09 LAB — LIPID PANEL

## 2024-02-09 LAB — BAYER DCA HB A1C WAIVED: HB A1C (BAYER DCA - WAIVED): 5.8 % — ABNORMAL HIGH (ref 4.8–5.6)

## 2024-02-09 MED ORDER — PANTOPRAZOLE SODIUM 40 MG PO TBEC: 40.0000 mg | DELAYED_RELEASE_TABLET | Freq: Every day | ORAL | 3 refills | Status: AC

## 2024-02-09 MED ORDER — ROSUVASTATIN CALCIUM 20 MG PO TABS: 20.0000 mg | ORAL_TABLET | Freq: Every day | ORAL | 3 refills | Status: AC

## 2024-02-09 MED ORDER — AMLODIPINE BESYLATE 5 MG PO TABS: 5.0000 mg | ORAL_TABLET | Freq: Every day | ORAL | 3 refills | Status: AC

## 2024-02-09 NOTE — Progress Notes (Signed)
 Subjective:  Patient ID: Vernon Richard, male    DOB: 1951-02-18  Age: 73 y.o. MRN: [id number]  CC: Annual Exam (No concerns at this time. )   HPI Vernon Richard presents for Annual physical.   presents for  follow-up of hypertension. Patient has no history of headache chest pain or shortness of breath or recent cough. Patient also denies symptoms of TIA such as focal numbness or weakness. Patient denies side effects from medication. States taking it regularly.   in for follow-up of elevated cholesterol. Doing well without complaints on current medication. Denies side effects of statin including myalgia and arthralgia and nausea. Currently no chest pain, shortness of breath or other cardiovascular related symptoms noted.      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   He has a past surgical history that includes Hernia repair; Cataract extraction, bilateral; Lung transplant, single (Right, 09/15/2018); Joint replacement (Bilateral); Hip Arthroplasty (Right); Mohs surgery (02/03/2023); and Mohs surgery (Right, 07/2023).   His family history includes Cancer in his father and mother.He reports that he has never smoked. He has never used smokeless tobacco. He reports that he does not drink alcohol and does not use drugs.    ROS Review of Systems  Constitutional:  Negative for activity change, fatigue and unexpected weight change.  HENT:  Negative for congestion, ear pain, hearing loss, postnasal drip and trouble swallowing.   Eyes:  Negative for pain and visual disturbance.  Respiratory:  Negative  for cough, chest tightness and shortness of breath.   Cardiovascular:  Negative for chest pain, palpitations and leg swelling.  Gastrointestinal:  Negative for abdominal distention, abdominal pain, blood in stool, constipation, diarrhea, nausea and vomiting.  Endocrine: Negative for cold intolerance, heat intolerance and polydipsia.  Genitourinary:  Negative for difficulty urinating, dysuria, flank pain, frequency and urgency.  Musculoskeletal:  Negative for arthralgias and joint swelling.  Skin:  Negative for color change, rash and wound.  Neurological:  Negative for dizziness, syncope, speech difficulty, weakness, light-headedness, numbness and headaches.  Hematological:  Does not bruise/bleed easily.  Psychiatric/Behavioral:  Negative for confusion, decreased concentration, dysphoric mood and sleep disturbance. The patient is not nervous/anxious.     Objective:  BP 133/70   Pulse 68   Temp 97.8 F (36.6 C)   Ht 5\' 8"  (1.727 m)   Wt 170 lb (77.1 kg)   SpO2 94%   BMI 25.85 kg/m   BP Readings from Last 3 Encounters:  02/09/24 133/70  08/10/23 119/63  02/04/23 117/69    Wt Readings from Last 3 Encounters:  02/09/24 170 lb (77.1 kg)  08/31/23 168 lb (76.2 kg)  08/10/23 173 lb (78.5 kg)     Physical Exam Constitutional:      Appearance: He is well-developed.  HENT:     Head: Normocephalic and atraumatic.  Eyes:     Pupils: Pupils are equal, round, and reactive to light.  Neck:     Thyroid : No thyromegaly.     Trachea: No tracheal deviation.  Cardiovascular:     Rate and Rhythm: Normal rate and regular  rhythm.     Heart sounds: Normal heart sounds. No murmur heard.    No friction rub. No gallop.  Pulmonary:     Breath sounds: Normal breath sounds. No wheezing or rales.  Abdominal:     General: Bowel sounds are normal. There is no distension.     Palpations: Abdomen is soft. There is no mass.     Tenderness: There is no abdominal tenderness.     Hernia: There is no  hernia in the left inguinal area.  Genitourinary:    Penis: Normal.      Testes: Normal.  Musculoskeletal:        General: Normal range of motion.     Cervical back: Normal range of motion.  Lymphadenopathy:     Cervical: No cervical adenopathy.  Skin:    General: Skin is warm and dry.  Neurological:     Mental Status: He is alert and oriented to person, place, and time.      Assessment & Plan:  Prediabetes -     Bayer DCA Hb A1c Waived  Mixed hyperlipidemia -     CMP14+EGFR -     Lipid panel  Essential hypertension -     CBC with Differential/Platelet -     amLODIPine  Besylate; Take 1 tablet (5 mg total) by mouth daily.  Dispense: 90 tablet; Refill: 3  Benign prostatic hyperplasia without lower urinary tract symptoms -     CBC with Differential/Platelet -     PSA, total and free  Osteopenia of multiple sites  Coronary artery disease involving native heart without angina pectoris, unspecified vessel or lesion type -     CBC with Differential/Platelet -     CMP14+EGFR -     Lipid panel  Screening for prostate cancer -     PSA, total and free  Vitamin D  deficiency -     VITAMIN D  25 Hydroxy (Vit-D Deficiency, Fractures)  Arthritis  Other orders -     Pantoprazole  Sodium; Take 1 tablet (40 mg total) by mouth daily.  Dispense: 90 tablet; Refill: 3 -     Rosuvastatin  Calcium ; Take 1 tablet (20 mg total) by mouth daily. For cholesterol  Dispense: 90 tablet; Refill: 3     Follow-up: Return in about 6 months (around 08/10/2024).  Vernon Richard, M.D.

## 2024-02-10 LAB — CBC WITH DIFFERENTIAL/PLATELET
Basophils Absolute: 0.1 10*3/uL (ref 0.0–0.2)
Basos: 1 %
EOS (ABSOLUTE): 0.1 10*3/uL (ref 0.0–0.4)
Eos: 3 %
Hematocrit: 37.7 % (ref 37.5–51.0)
Hemoglobin: 11.6 g/dL — ABNORMAL LOW (ref 13.0–17.7)
Immature Grans (Abs): 0.1 10*3/uL (ref 0.0–0.1)
Immature Granulocytes: 2 %
Lymphocytes Absolute: 0.4 10*3/uL — ABNORMAL LOW (ref 0.7–3.1)
Lymphs: 10 %
MCH: 25.2 pg — ABNORMAL LOW (ref 26.6–33.0)
MCHC: 30.8 g/dL — ABNORMAL LOW (ref 31.5–35.7)
MCV: 82 fL (ref 79–97)
Monocytes Absolute: 0.8 10*3/uL (ref 0.1–0.9)
Monocytes: 19 %
Neutrophils Absolute: 2.7 10*3/uL (ref 1.4–7.0)
Neutrophils: 65 %
Platelets: 222 10*3/uL (ref 150–450)
RBC: 4.6 x10E6/uL (ref 4.14–5.80)
RDW: 16.7 % — ABNORMAL HIGH (ref 11.6–15.4)
WBC: 4.2 10*3/uL (ref 3.4–10.8)

## 2024-02-10 LAB — CMP14+EGFR
ALT: 14 IU/L (ref 0–44)
AST: 20 IU/L (ref 0–40)
Albumin: 4.4 g/dL (ref 3.8–4.8)
Alkaline Phosphatase: 46 IU/L (ref 44–121)
BUN/Creatinine Ratio: 14 (ref 10–24)
BUN: 20 mg/dL (ref 8–27)
Bilirubin Total: 0.4 mg/dL (ref 0.0–1.2)
CO2: 21 mmol/L (ref 20–29)
Calcium: 9.2 mg/dL (ref 8.6–10.2)
Chloride: 105 mmol/L (ref 96–106)
Creatinine, Ser: 1.4 mg/dL — ABNORMAL HIGH (ref 0.76–1.27)
Globulin, Total: 1.8 g/dL (ref 1.5–4.5)
Glucose: 92 mg/dL (ref 70–99)
Potassium: 4.4 mmol/L (ref 3.5–5.2)
Sodium: 141 mmol/L (ref 134–144)
Total Protein: 6.2 g/dL (ref 6.0–8.5)
eGFR: 53 mL/min/{1.73_m2} — ABNORMAL LOW (ref >59)

## 2024-02-10 LAB — PSA, TOTAL AND FREE
PSA, Free Pct: 67.5 %
PSA, Free: 0.27 ng/mL
Prostate Specific Ag, Serum: 0.4 ng/mL (ref 0.0–4.0)

## 2024-02-10 LAB — LIPID PANEL
Cholesterol, Total: 173 mg/dL (ref 100–199)
HDL: 65 mg/dL (ref >39)
LDL CALC COMMENT:: 2.7 ratio (ref 0.0–5.0)
LDL Chol Calc (NIH): 80 mg/dL (ref 0–99)
Triglycerides: 166 mg/dL — ABNORMAL HIGH (ref 0–149)
VLDL Cholesterol Cal: 28 mg/dL (ref 5–40)

## 2024-02-11 DIAGNOSIS — T86819 Unspecified complication of lung transplant: Secondary | ICD-10-CM | POA: Diagnosis not present

## 2024-02-11 DIAGNOSIS — Z5181 Encounter for therapeutic drug level monitoring: Secondary | ICD-10-CM | POA: Diagnosis not present

## 2024-02-11 DIAGNOSIS — Z79899 Other long term (current) drug therapy: Secondary | ICD-10-CM | POA: Diagnosis not present

## 2024-02-11 DIAGNOSIS — Z942 Lung transplant status: Secondary | ICD-10-CM | POA: Diagnosis not present

## 2024-02-15 ENCOUNTER — Encounter: Payer: Self-pay | Admitting: Family Medicine

## 2024-02-18 ENCOUNTER — Encounter: Payer: Self-pay | Admitting: *Deleted

## 2024-02-23 DIAGNOSIS — Z961 Presence of intraocular lens: Secondary | ICD-10-CM | POA: Diagnosis not present

## 2024-02-23 DIAGNOSIS — H04123 Dry eye syndrome of bilateral lacrimal glands: Secondary | ICD-10-CM | POA: Diagnosis not present

## 2024-02-23 DIAGNOSIS — L821 Other seborrheic keratosis: Secondary | ICD-10-CM | POA: Diagnosis not present

## 2024-02-23 DIAGNOSIS — H0288A Meibomian gland dysfunction right eye, upper and lower eyelids: Secondary | ICD-10-CM | POA: Diagnosis not present

## 2024-02-23 DIAGNOSIS — L814 Other melanin hyperpigmentation: Secondary | ICD-10-CM | POA: Diagnosis not present

## 2024-02-23 DIAGNOSIS — D0439 Carcinoma in situ of skin of other parts of face: Secondary | ICD-10-CM | POA: Diagnosis not present

## 2024-02-23 DIAGNOSIS — L570 Actinic keratosis: Secondary | ICD-10-CM | POA: Diagnosis not present

## 2024-02-23 DIAGNOSIS — Z85828 Personal history of other malignant neoplasm of skin: Secondary | ICD-10-CM | POA: Diagnosis not present

## 2024-02-23 DIAGNOSIS — H0288B Meibomian gland dysfunction left eye, upper and lower eyelids: Secondary | ICD-10-CM | POA: Diagnosis not present

## 2024-02-23 DIAGNOSIS — L579 Skin changes due to chronic exposure to nonionizing radiation, unspecified: Secondary | ICD-10-CM | POA: Diagnosis not present

## 2024-02-23 DIAGNOSIS — D485 Neoplasm of uncertain behavior of skin: Secondary | ICD-10-CM | POA: Diagnosis not present

## 2024-02-23 DIAGNOSIS — C44329 Squamous cell carcinoma of skin of other parts of face: Secondary | ICD-10-CM | POA: Diagnosis not present

## 2024-03-13 DIAGNOSIS — Z942 Lung transplant status: Secondary | ICD-10-CM | POA: Diagnosis not present

## 2024-03-13 DIAGNOSIS — D801 Nonfamilial hypogammaglobulinemia: Secondary | ICD-10-CM | POA: Diagnosis not present

## 2024-03-13 DIAGNOSIS — D849 Immunodeficiency, unspecified: Secondary | ICD-10-CM | POA: Diagnosis not present

## 2024-03-15 DIAGNOSIS — Z942 Lung transplant status: Secondary | ICD-10-CM | POA: Diagnosis not present

## 2024-03-15 DIAGNOSIS — Z79899 Other long term (current) drug therapy: Secondary | ICD-10-CM | POA: Diagnosis not present

## 2024-03-15 DIAGNOSIS — Z2989 Encounter for other specified prophylactic measures: Secondary | ICD-10-CM | POA: Diagnosis not present

## 2024-03-15 DIAGNOSIS — T86819 Unspecified complication of lung transplant: Secondary | ICD-10-CM | POA: Diagnosis not present

## 2024-03-16 DIAGNOSIS — D124 Benign neoplasm of descending colon: Secondary | ICD-10-CM | POA: Diagnosis not present

## 2024-03-16 DIAGNOSIS — Z860101 Personal history of adenomatous and serrated colon polyps: Secondary | ICD-10-CM | POA: Diagnosis not present

## 2024-03-16 DIAGNOSIS — Z1211 Encounter for screening for malignant neoplasm of colon: Secondary | ICD-10-CM | POA: Diagnosis not present

## 2024-03-22 DIAGNOSIS — C44329 Squamous cell carcinoma of skin of other parts of face: Secondary | ICD-10-CM | POA: Diagnosis not present

## 2024-04-05 DIAGNOSIS — L814 Other melanin hyperpigmentation: Secondary | ICD-10-CM | POA: Diagnosis not present

## 2024-04-05 DIAGNOSIS — L570 Actinic keratosis: Secondary | ICD-10-CM | POA: Diagnosis not present

## 2024-04-05 DIAGNOSIS — L821 Other seborrheic keratosis: Secondary | ICD-10-CM | POA: Diagnosis not present

## 2024-04-05 DIAGNOSIS — L579 Skin changes due to chronic exposure to nonionizing radiation, unspecified: Secondary | ICD-10-CM | POA: Diagnosis not present

## 2024-04-05 DIAGNOSIS — Z85828 Personal history of other malignant neoplasm of skin: Secondary | ICD-10-CM | POA: Diagnosis not present

## 2024-04-13 DIAGNOSIS — T86819 Unspecified complication of lung transplant: Secondary | ICD-10-CM | POA: Diagnosis not present

## 2024-04-13 DIAGNOSIS — Z5181 Encounter for therapeutic drug level monitoring: Secondary | ICD-10-CM | POA: Diagnosis not present

## 2024-04-13 DIAGNOSIS — Z942 Lung transplant status: Secondary | ICD-10-CM | POA: Diagnosis not present

## 2024-04-13 DIAGNOSIS — Z79899 Other long term (current) drug therapy: Secondary | ICD-10-CM | POA: Diagnosis not present

## 2024-05-12 DIAGNOSIS — T86810 Lung transplant rejection: Secondary | ICD-10-CM | POA: Diagnosis not present

## 2024-05-12 DIAGNOSIS — N1831 Chronic kidney disease, stage 3a: Secondary | ICD-10-CM | POA: Diagnosis not present

## 2024-05-12 DIAGNOSIS — Z79899 Other long term (current) drug therapy: Secondary | ICD-10-CM | POA: Diagnosis not present

## 2024-05-12 DIAGNOSIS — Z5181 Encounter for therapeutic drug level monitoring: Secondary | ICD-10-CM | POA: Diagnosis not present

## 2024-05-12 DIAGNOSIS — E785 Hyperlipidemia, unspecified: Secondary | ICD-10-CM | POA: Diagnosis not present

## 2024-05-12 DIAGNOSIS — R931 Abnormal findings on diagnostic imaging of heart and coronary circulation: Secondary | ICD-10-CM | POA: Diagnosis not present

## 2024-05-12 DIAGNOSIS — D84821 Immunodeficiency due to drugs: Secondary | ICD-10-CM | POA: Diagnosis not present

## 2024-05-12 DIAGNOSIS — J8410 Pulmonary fibrosis, unspecified: Secondary | ICD-10-CM | POA: Diagnosis not present

## 2024-05-12 DIAGNOSIS — Z7952 Long term (current) use of systemic steroids: Secondary | ICD-10-CM | POA: Diagnosis not present

## 2024-05-12 DIAGNOSIS — N183 Chronic kidney disease, stage 3 unspecified: Secondary | ICD-10-CM | POA: Diagnosis not present

## 2024-05-12 DIAGNOSIS — J9811 Atelectasis: Secondary | ICD-10-CM | POA: Diagnosis not present

## 2024-05-12 DIAGNOSIS — Z942 Lung transplant status: Secondary | ICD-10-CM | POA: Diagnosis not present

## 2024-05-12 DIAGNOSIS — Z96641 Presence of right artificial hip joint: Secondary | ICD-10-CM | POA: Diagnosis not present

## 2024-05-12 DIAGNOSIS — T86818 Other complications of lung transplant: Secondary | ICD-10-CM | POA: Diagnosis not present

## 2024-05-12 DIAGNOSIS — Z96653 Presence of artificial knee joint, bilateral: Secondary | ICD-10-CM | POA: Diagnosis not present

## 2024-05-12 DIAGNOSIS — I129 Hypertensive chronic kidney disease with stage 1 through stage 4 chronic kidney disease, or unspecified chronic kidney disease: Secondary | ICD-10-CM | POA: Diagnosis not present

## 2024-05-12 DIAGNOSIS — Z79621 Long term (current) use of calcineurin inhibitor: Secondary | ICD-10-CM | POA: Diagnosis not present

## 2024-05-12 DIAGNOSIS — E1122 Type 2 diabetes mellitus with diabetic chronic kidney disease: Secondary | ICD-10-CM | POA: Diagnosis not present

## 2024-05-27 DIAGNOSIS — Z79899 Other long term (current) drug therapy: Secondary | ICD-10-CM | POA: Diagnosis not present

## 2024-05-27 DIAGNOSIS — T86819 Unspecified complication of lung transplant: Secondary | ICD-10-CM | POA: Diagnosis not present

## 2024-05-27 DIAGNOSIS — Z5181 Encounter for therapeutic drug level monitoring: Secondary | ICD-10-CM | POA: Diagnosis not present

## 2024-05-27 DIAGNOSIS — Z942 Lung transplant status: Secondary | ICD-10-CM | POA: Diagnosis not present

## 2024-05-31 DIAGNOSIS — Z85828 Personal history of other malignant neoplasm of skin: Secondary | ICD-10-CM | POA: Diagnosis not present

## 2024-05-31 DIAGNOSIS — D485 Neoplasm of uncertain behavior of skin: Secondary | ICD-10-CM | POA: Diagnosis not present

## 2024-05-31 DIAGNOSIS — L579 Skin changes due to chronic exposure to nonionizing radiation, unspecified: Secondary | ICD-10-CM | POA: Diagnosis not present

## 2024-05-31 DIAGNOSIS — D0439 Carcinoma in situ of skin of other parts of face: Secondary | ICD-10-CM | POA: Diagnosis not present

## 2024-05-31 DIAGNOSIS — C44622 Squamous cell carcinoma of skin of right upper limb, including shoulder: Secondary | ICD-10-CM | POA: Diagnosis not present

## 2024-05-31 DIAGNOSIS — L570 Actinic keratosis: Secondary | ICD-10-CM | POA: Diagnosis not present

## 2024-06-12 DIAGNOSIS — Z942 Lung transplant status: Secondary | ICD-10-CM | POA: Diagnosis not present

## 2024-06-12 DIAGNOSIS — D849 Immunodeficiency, unspecified: Secondary | ICD-10-CM | POA: Diagnosis not present

## 2024-06-12 DIAGNOSIS — D801 Nonfamilial hypogammaglobulinemia: Secondary | ICD-10-CM | POA: Diagnosis not present

## 2024-06-24 DIAGNOSIS — Z79899 Other long term (current) drug therapy: Secondary | ICD-10-CM | POA: Diagnosis not present

## 2024-06-24 DIAGNOSIS — T86819 Unspecified complication of lung transplant: Secondary | ICD-10-CM | POA: Diagnosis not present

## 2024-06-24 DIAGNOSIS — Z5181 Encounter for therapeutic drug level monitoring: Secondary | ICD-10-CM | POA: Diagnosis not present

## 2024-07-14 DIAGNOSIS — L905 Scar conditions and fibrosis of skin: Secondary | ICD-10-CM | POA: Diagnosis not present

## 2024-07-14 DIAGNOSIS — C44622 Squamous cell carcinoma of skin of right upper limb, including shoulder: Secondary | ICD-10-CM | POA: Diagnosis not present

## 2024-07-28 DIAGNOSIS — D225 Melanocytic nevi of trunk: Secondary | ICD-10-CM | POA: Diagnosis not present

## 2024-07-28 DIAGNOSIS — L570 Actinic keratosis: Secondary | ICD-10-CM | POA: Diagnosis not present

## 2024-07-28 DIAGNOSIS — L579 Skin changes due to chronic exposure to nonionizing radiation, unspecified: Secondary | ICD-10-CM | POA: Diagnosis not present

## 2024-07-28 DIAGNOSIS — Z85828 Personal history of other malignant neoplasm of skin: Secondary | ICD-10-CM | POA: Diagnosis not present

## 2024-07-28 DIAGNOSIS — Z4802 Encounter for removal of sutures: Secondary | ICD-10-CM | POA: Diagnosis not present

## 2024-07-28 DIAGNOSIS — D235 Other benign neoplasm of skin of trunk: Secondary | ICD-10-CM | POA: Diagnosis not present

## 2024-07-28 DIAGNOSIS — D0439 Carcinoma in situ of skin of other parts of face: Secondary | ICD-10-CM | POA: Diagnosis not present

## 2024-07-28 DIAGNOSIS — L821 Other seborrheic keratosis: Secondary | ICD-10-CM | POA: Diagnosis not present

## 2024-07-28 DIAGNOSIS — L814 Other melanin hyperpigmentation: Secondary | ICD-10-CM | POA: Diagnosis not present

## 2024-08-10 ENCOUNTER — Ambulatory Visit: Admitting: Family Medicine

## 2024-08-24 ENCOUNTER — Ambulatory Visit: Payer: Self-pay | Admitting: Family Medicine

## 2024-08-24 ENCOUNTER — Encounter: Payer: Self-pay | Admitting: Family Medicine

## 2024-08-24 VITALS — BP 139/68 | HR 70 | Temp 97.5°F | Ht 68.0 in | Wt 170.0 lb

## 2024-08-24 DIAGNOSIS — I2510 Atherosclerotic heart disease of native coronary artery without angina pectoris: Secondary | ICD-10-CM

## 2024-08-24 DIAGNOSIS — I10 Essential (primary) hypertension: Secondary | ICD-10-CM

## 2024-08-24 DIAGNOSIS — E782 Mixed hyperlipidemia: Secondary | ICD-10-CM | POA: Diagnosis not present

## 2024-08-24 DIAGNOSIS — Z23 Encounter for immunization: Secondary | ICD-10-CM | POA: Diagnosis not present

## 2024-08-24 DIAGNOSIS — I480 Paroxysmal atrial fibrillation: Secondary | ICD-10-CM

## 2024-08-24 DIAGNOSIS — R7303 Prediabetes: Secondary | ICD-10-CM

## 2024-08-24 DIAGNOSIS — Z942 Lung transplant status: Secondary | ICD-10-CM

## 2024-08-24 DIAGNOSIS — D849 Immunodeficiency, unspecified: Secondary | ICD-10-CM

## 2024-08-24 LAB — CBC WITH DIFFERENTIAL/PLATELET
Basophils Absolute: 0.1 x10E3/uL (ref 0.0–0.2)
Basos: 2 %
EOS (ABSOLUTE): 0.2 x10E3/uL (ref 0.0–0.4)
Eos: 4 %
Hematocrit: 39.2 % (ref 37.5–51.0)
Hemoglobin: 11.9 g/dL — ABNORMAL LOW (ref 13.0–17.7)
Immature Grans (Abs): 0.1 x10E3/uL (ref 0.0–0.1)
Immature Granulocytes: 2 %
Lymphocytes Absolute: 0.4 x10E3/uL — ABNORMAL LOW (ref 0.7–3.1)
Lymphs: 10 %
MCH: 24.8 pg — ABNORMAL LOW (ref 26.6–33.0)
MCHC: 30.4 g/dL — ABNORMAL LOW (ref 31.5–35.7)
MCV: 82 fL (ref 79–97)
Monocytes Absolute: 0.8 x10E3/uL (ref 0.1–0.9)
Monocytes: 19 %
Neutrophils Absolute: 2.6 x10E3/uL (ref 1.4–7.0)
Neutrophils: 63 %
Platelets: 215 x10E3/uL (ref 150–450)
RBC: 4.79 x10E6/uL (ref 4.14–5.80)
RDW: 15.5 % — ABNORMAL HIGH (ref 11.6–15.4)
WBC: 4.1 x10E3/uL (ref 3.4–10.8)

## 2024-08-24 LAB — LIPID PANEL
Chol/HDL Ratio: 2.9 ratio (ref 0.0–5.0)
Cholesterol, Total: 183 mg/dL (ref 100–199)
HDL: 63 mg/dL (ref >39)
LDL Chol Calc (NIH): 102 mg/dL — ABNORMAL HIGH (ref 0–99)
Triglycerides: 100 mg/dL (ref 0–149)
VLDL Cholesterol Cal: 18 mg/dL (ref 5–40)

## 2024-08-24 LAB — COMPREHENSIVE METABOLIC PANEL WITH GFR
ALT: 11 IU/L (ref 0–44)
AST: 18 IU/L (ref 0–40)
Albumin: 4.3 g/dL (ref 3.8–4.8)
Alkaline Phosphatase: 40 IU/L — ABNORMAL LOW (ref 47–123)
BUN/Creatinine Ratio: 12 (ref 10–24)
BUN: 17 mg/dL (ref 8–27)
Bilirubin Total: 0.3 mg/dL (ref 0.0–1.2)
CO2: 26 mmol/L (ref 20–29)
Calcium: 9.1 mg/dL (ref 8.6–10.2)
Chloride: 104 mmol/L (ref 96–106)
Creatinine, Ser: 1.42 mg/dL — ABNORMAL HIGH (ref 0.76–1.27)
Globulin, Total: 1.8 g/dL (ref 1.5–4.5)
Glucose: 96 mg/dL (ref 70–99)
Potassium: 4.2 mmol/L (ref 3.5–5.2)
Sodium: 142 mmol/L (ref 134–144)
Total Protein: 6.1 g/dL (ref 6.0–8.5)
eGFR: 52 mL/min/1.73 — ABNORMAL LOW (ref >59)

## 2024-08-24 LAB — BAYER DCA HB A1C WAIVED: HB A1C (BAYER DCA - WAIVED): 5.5 % (ref 4.8–5.6)

## 2024-08-24 NOTE — Progress Notes (Signed)
 Subjective:  Patient ID: Vernon Richard, male    DOB: 11-19-1950  Age: 74 y.o. MRN: [id number]  CC: Follow-up (No concerns at this time. LS)   HPI  Discussed the use of AI scribe software for clinical note transcription with the patient, who gave verbal consent to proceed.  History of Present Illness Vernon Richard is a 73 year old male who presents for follow-up on his dermatological treatment and routine health maintenance.  He is currently undergoing treatment for skin cancer, applying a topical cream for 14 days, which causes burning and discomfort. He has been using the cream for about a week and is scheduled to complete the course by Monday. He visits his dermatologist every two months for treatment, which often includes cryotherapy. He has been a patient at [facility] for 29 years.  His A1c level is 5.5. He engages in regular exercise and has a low appetite, though he enjoys eating potatoes. He is not fond of sweets.  His blood pressure is typically around 121/68, and he monitors it daily. He weighs approximately 165 pounds, which he maintains through regular exercise. He is on medication for cholesterol and blood pressure, including prednisone  5 mg daily and tacrolimus  for organ rejection.  He experiences dry eyes and uses 'Refresh' eye drops for relief. He also reports a sensation of water in his right ear, possibly related to allergies.          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   He has a past surgical history that includes Hernia repair; Cataract extraction,  bilateral; Lung transplant, single (Right, 09/15/2018); Joint replacement (Bilateral); Hip Arthroplasty (Right); Mohs surgery (02/03/2023); and Mohs surgery (Right, 07/2023).   His family history includes Cancer in his father and mother.He reports that he has never smoked. He has never used smokeless tobacco. He reports that he does not drink alcohol and does not use drugs.    ROS Review of Systems  Constitutional: Negative.   HENT: Negative.    Eyes:  Negative for visual disturbance.  Respiratory:  Negative for cough and shortness of breath.   Cardiovascular:  Negative for chest pain and leg swelling.  Gastrointestinal:  Negative for abdominal pain, diarrhea, nausea and vomiting.  Genitourinary:  Negative for difficulty urinating.  Musculoskeletal:  Negative for arthralgias and myalgias.  Skin:  Negative for rash.  Neurological:  Negative for headaches.  Psychiatric/Behavioral:  Negative for sleep disturbance.     Objective:  BP 139/68   Pulse 70   Temp (!) 97.5 F (36.4 C)   Ht 5' 8 (1.727 m)   Wt 170 lb (77.1 kg)   SpO2 97%   BMI 25.85 kg/m   BP Readings from Last 3 Encounters:  08/24/24 139/68  02/09/24 133/70  08/10/23 119/63    Wt Readings from Last 3 Encounters:  08/24/24 170 lb (77.1 kg)  02/09/24 170 lb (77.1 kg)  08/31/23 168 lb (76.2 kg)     Physical Exam Vitals reviewed.  Constitutional:      Appearance: He is well-developed.  HENT:     Head: Normocephalic  and atraumatic.     Right Ear: External ear normal.     Left Ear: External ear normal.     Mouth/Throat:     Pharynx: No oropharyngeal exudate or posterior oropharyngeal erythema.  Eyes:     Pupils: Pupils are equal, round, and reactive to light.  Cardiovascular:     Rate and Rhythm: Normal rate and regular rhythm.     Heart sounds: No murmur heard. Pulmonary:     Effort: No respiratory distress.     Breath sounds: Normal breath sounds.  Musculoskeletal:     Cervical back: Normal range of  motion and neck supple.  Neurological:     Mental Status: He is alert and oriented to person, place, and time.    Physical Exam VITALS: BP- 139/68 MEASUREMENTS: Weight- 170. GENERAL: Alert, cooperative, well developed, no acute distress HEENT: Normocephalic, normal oropharynx, moist mucous membranes, ears normal, no cerumen impaction CHEST: Clear to auscultation bilaterally, no wheezes, rhonchi, or crackles CARDIOVASCULAR: Normal heart rate and rhythm, S1 and S2 normal without murmurs ABDOMEN: Soft, non-tender, non-distended, without organomegaly, normal bowel sounds EXTREMITIES: No cyanosis or edema NEUROLOGICAL: Cranial nerves grossly intact, moves all extremities without gross motor or sensory deficit   Assessment & Plan:  Mixed hyperlipidemia -     Lipid panel  Prediabetes -     Bayer DCA Hb A1c Waived  Coronary artery disease involving native heart without angina pectoris, unspecified vessel or lesion type -     CBC with Differential/Platelet -     Lipid panel -     Comprehensive metabolic panel with GFR  Essential hypertension -     CBC with Differential/Platelet    Assessment and Plan Assessment & Plan Skin cancer, left side of face   Undergoing treatment with topical cream, causing expected burning and discomfort. Dermatologist follow-up every two months for management, including cryotherapy as needed. Treatment aims to reduce cryotherapy frequency and improve skin smoothness. Continue current topical treatment as prescribed. Use moisturizer to soothe skin, avoiding application within a couple of hours of the topical treatment.  Lung transplant status with immunosuppression   Status post lung transplant, on immunosuppressive therapy including prednisone  and tacrolimus . Continues follow-up with lung specialist. Continue prednisone  5 mg daily and tacrolimus  as prescribed. Follow up with lung specialist as scheduled.  Coronary artery disease of native heart without  angina   Coronary artery disease without current angina symptoms. Continues on current medication regimen.  Essential hypertension   Blood pressure well-controlled with current medication regimen. Recent readings include 121/68 mmHg, within target range. Continue current antihypertensive medication regimen and monitor blood pressure regularly.  Mixed hyperlipidemia   Awaiting current cholesterol levels from recent blood draw. Continues on rosuvastatin  for lipid management. Continue rosuvastatin  20 mg daily and review cholesterol results once available.  Prediabetes (resolved, currently normal A1c)   A1c is currently 5.5%, within non-diabetic range. Previously diagnosed with prediabetes, now resolved. Discussed dietary modifications to maintain glycemic control. Monitor A1c periodically and encourage dietary modifications, such as reducing potato intake and considering sweet potatoes or potato skins instead.  General Health Maintenance   Discussed exercise and weight management. Current weight is close to ideal at 165 lbs. Discussed dry eyes and use of Refresh drops. Encourage regular exercise and maintaining current weight. Continue using Refresh drops for dry eyes.       Follow-up: No follow-ups on file.  Butler Der, M.D.

## 2024-08-31 ENCOUNTER — Ambulatory Visit (INDEPENDENT_AMBULATORY_CARE_PROVIDER_SITE_OTHER): Payer: Medicare Other

## 2024-08-31 VITALS — BP 139/68 | HR 70 | Ht 68.0 in | Wt 170.0 lb

## 2024-08-31 DIAGNOSIS — Z0000 Encounter for general adult medical examination without abnormal findings: Secondary | ICD-10-CM

## 2024-08-31 NOTE — Progress Notes (Addendum)
 Chief Complaint  Patient presents with   Medicare Wellness     Subjective:   Vernon Richard is a 73 y.o. male who presents for a Medicare Annual Wellness Visit.  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) 07/21/2019   Arthritis    OA of both knees   Dysphagia, oropharyngeal 09/20/2018   Heart murmur, systolic    Hyperlipidemia    Hypertension    Pulmonary fibrosis (HCC) 05/03/2018   Past Surgical History:  Procedure Laterality Date   CATARACT EXTRACTION, BILATERAL     HERNIA REPAIR     groin   HIP ARTHROPLASTY Right    JOINT REPLACEMENT Bilateral    knees   LUNG TRANSPLANT, SINGLE Right 09/15/2018   MOHS SURGERY  02/03/2023   face, left side above lip   MOHS SURGERY Right 07/2023   behind right ear   Family History  Problem Relation Age of Onset   Cancer Mother    Cancer Father    Social History   Occupational History   Occupation: Farm  Tobacco Use   Smoking status: Never   Smokeless tobacco: Never  Vaping Use   Vaping status: Never Used  Substance and Sexual Activity   Alcohol use: No   Drug use: No   Sexual activity: Not Currently   Tobacco Counseling Counseling given: Yes  SDOH Screenings   Food Insecurity: No Food Insecurity (08/31/2024)  Housing: Unknown (08/31/2024)  Transportation Needs: No Transportation Needs (08/31/2024)  Utilities: Not At Risk (08/31/2024)  Alcohol Screen: Low Risk  (08/31/2023)  Depression (PHQ2-9): Low Risk  (08/31/2024)  Financial Resource Strain: Low Risk  (08/31/2023)  Physical Activity: Insufficiently Active (08/31/2024)  Social Connections: Moderately Isolated (08/31/2024)  Stress: No Stress Concern Present (08/31/2024)  Tobacco Use: Low Risk  (08/31/2024)  Health Literacy: Adequate Health Literacy (08/31/2024)   Depression Screen    08/31/2024    9:40 AM 08/31/2023   11:01 AM 08/10/2023    7:59 AM  02/04/2023    8:22 AM 08/29/2022   10:33 AM 08/07/2022    8:13 AM 02/05/2022    8:00 AM  PHQ 2/9 Scores  PHQ - 2 Score 0 0 0 0 0 0 0  PHQ- 9 Score     0  4       Data saved with a previous flowsheet row definition     Goals Addressed             This Visit's Progress    DIET - EAT MORE FRUITS AND VEGETABLES   On track    Have 3 meals a day   On track      Visit info / Clinical Intake: Medicare Wellness Visit Type:: Subsequent Annual Wellness Visit Persons participating in visit:: patient Medicare Wellness Visit Mode:: Telephone If telephone:: video declined Because this visit was a virtual/telehealth visit:: vitals recorded from last visit If Telephone or Video please confirm:: I connected with the patient using audio enabled telemedicine application and verified that I am speaking with the correct person using two identifiers Patient Location:: home Provider Location:: home office Information given by:: patient Interpreter Needed?: No Pre-visit prep was completed: yes AWV questionnaire completed by patient prior to visit?: no Living arrangements:: lives with spouse/significant other Patient's Overall Health Status Rating: very good Typical amount of pain: none Does pain affect daily life?: no Are you currently prescribed opioids?: no  Dietary Habits  and Nutritional Risks How many meals a day?: 2 Eats fruit and vegetables daily?: yes Most meals are obtained by: preparing own meals In the last 2 weeks, have you had any of the following?: none Diabetic:: no  Functional Status Activities of Daily Living (to include ambulation/medication): Independent Ambulation: Independent Medication Administration: Needs assistance (comment) (pts wife helps) Home Management: Independent Manage your own finances?: yes (pts wife helps) Primary transportation is: driving Concerns about hearing?: no  Fall Screening Falls in the past year?: 0 Number of falls in past year: 0 Was  there an injury with Fall?: 0 Fall Risk Category Calculator: 0 Patient Fall Risk Level: Low Fall Risk  Fall Risk Patient at Risk for Falls Due to: No Fall Risks Fall risk Follow up: Falls evaluation completed  Home and Transportation Safety: All rugs have non-skid backing?: yes All stairs or steps have railings?: yes Grab bars in the bathtub or shower?: yes Have non-skid surface in bathtub or shower?: yes Good home lighting?: yes Regular seat belt use?: yes Hospital stays in the last year:: no  Cognitive Assessment Difficulty concentrating, remembering, or making decisions? : no Will 6CIT or Mini Cog be Completed: yes What year is it?: 0 points What month is it?: 0 points Give patient an address phrase to remember (5 components): [address] [place], [place] About what time is it?: 0 points Count backwards from 20 to 1: 0 points Say the months of the year in reverse: 0 points Repeat the address phrase from earlier: 0 points 6 CIT Score: 0 points  Advance Directives (For Healthcare) Does Patient Have a Medical Advance Directive?: No Would patient like information on creating a medical advance directive?: No - Patient declined  Reviewed/Updated  Reviewed/Updated: Reviewed All (Medical, Surgical, Family, Medications, Allergies, Care Teams, Patient Goals); Medical History; Surgical History; Family History; Medications; Allergies; Care Teams; Patient Goals        Objective:    Today's Vitals   08/31/24 0933  BP: 139/68  Pulse: 70  Weight: 170 lb (77.1 kg)  Height: 5' 8 (1.727 m)   Body mass index is 25.85 kg/m.  Current Medications (verified) Outpatient Encounter Medications as of 08/31/2024  Medication Sig   acetaminophen (TYLENOL) 500 MG tablet Take by mouth.   acyclovir (ZOVIRAX) 200 MG capsule Take 200 mg by mouth 2 (two) times daily.   amLODipine  (NORVASC ) 5 MG tablet Take 1 tablet (5 mg total) by mouth daily.   aspirin 81 MG tablet Take 81 mg by mouth daily.    Calcium  Citrate-Vitamin D  200-250 MG-UNIT TABS Take by mouth.   cetirizine (ZYRTEC) 10 MG tablet Take 10 mg by mouth daily.   fluticasone  (FLONASE ) 50 MCG/ACT nasal spray Place 2 sprays into both nostrils daily.   MAGNESIUM GLYCINATE PO Take 800 mg by mouth 2 (two) times daily.   Multiple Vitamin (MULTI-VITAMIN) tablet Take by mouth.   Mycophenolate  Mofetil (CELLCEPT  PO) Take by mouth.   pantoprazole  (PROTONIX ) 40 MG tablet Take 1 tablet (40 mg total) by mouth daily.   polyethylene glycol powder (GLYCOLAX/MIRALAX) 17 GM/SCOOP powder Take by mouth.   predniSONE  (DELTASONE ) 5 MG tablet Take 5 mg by mouth daily with breakfast.   rosuvastatin  (CRESTOR ) 20 MG tablet Take 1 tablet (20 mg total) by mouth daily. For cholesterol   Tacrolimus  ER 1 MG TB24 08/23/19 Take three- 1 mg tabs daily (total of 3 mg).   No facility-administered encounter medications on file as of 08/31/2024.   Hearing/Vision screen Hearing Screening -  Comments:: Pt denies hearing dif Vision Screening - Comments:: Pt denies vision dif/pt goes [facility]/last ov 6mos ago Immunizations and Health Maintenance Health Maintenance  Topic Date Due   COVID-19 Vaccine (4 - 2025-26 season) 09/09/2024 (Originally 06/20/2024)   Medicare Annual Wellness (AWV)  08/31/2025   Colonoscopy  03/16/2029   DTaP/Tdap/Td (3 - Td or Tdap) 08/07/2032   Pneumococcal Vaccine: 50+ Years  Completed   Influenza Vaccine  Completed   Hepatitis C Screening  Completed   Zoster Vaccines- Shingrix   Completed   Meningococcal B Vaccine  Aged Out   Hepatitis B Vaccines 19-59 Average Risk  Discontinued        Assessment/Plan:  This is a routine wellness examination for Vernon Richard.  Patient Care Team: Zollie Lowers, MD as PCP - General (Family Medicine)  I have personally reviewed and noted the following in the patient's chart:   Medical and social history Use of alcohol, tobacco or illicit drugs  Current medications and supplements including opioid  prescriptions. Functional ability and status Nutritional status Physical activity Advanced directives List of other physicians Hospitalizations, surgeries, and ER visits in previous 12 months Vitals Screenings to include cognitive, depression, and falls Referrals and appointments  No orders of the defined types were placed in this encounter.  In addition, I have reviewed and discussed with patient certain preventive protocols, quality metrics, and best practice recommendations. A written personalized care plan for preventive services as well as general preventive health recommendations were provided to patient.   Vernon Richard, CMA   08/31/2024   Return in 1 year (on 08/31/2025).  After Visit Summary: (MyChart) Due to this being a telephonic visit, the after visit summary with patients personalized plan was offered to patient via MyChart   Nurse Notes: n/a

## 2024-09-01 LAB — IRON AND TIBC
Iron Saturation: 11 % — ABNORMAL LOW (ref 15–55)
Iron: 35 ug/dL — ABNORMAL LOW (ref 38–169)
Total Iron Binding Capacity: 318 ug/dL (ref 250–450)
UIBC: 283 ug/dL (ref 111–343)

## 2024-09-01 LAB — FERRITIN: Ferritin: 21 ng/mL — ABNORMAL LOW (ref 30–400)

## 2024-09-01 LAB — SPECIMEN STATUS REPORT

## 2025-02-21 ENCOUNTER — Encounter: Admitting: Family Medicine

## 2025-09-01 ENCOUNTER — Ambulatory Visit
